# Patient Record
Sex: Female | Born: 1940 | Race: White | Hispanic: No | Smoking: Never smoker
Health system: Southern US, Community
[De-identification: ages and names within clinical notes are randomized; demographics above are authoritative.]

## PROBLEM LIST (undated history)

## (undated) DIAGNOSIS — E785 Hyperlipidemia, unspecified: Secondary | ICD-10-CM

## (undated) DIAGNOSIS — E039 Hypothyroidism, unspecified: Secondary | ICD-10-CM

## (undated) DIAGNOSIS — I10 Essential (primary) hypertension: Secondary | ICD-10-CM

## (undated) DIAGNOSIS — I509 Heart failure, unspecified: Secondary | ICD-10-CM

## (undated) DIAGNOSIS — I447 Left bundle-branch block, unspecified: Secondary | ICD-10-CM

## (undated) DIAGNOSIS — I429 Cardiomyopathy, unspecified: Secondary | ICD-10-CM

## (undated) DIAGNOSIS — M1990 Unspecified osteoarthritis, unspecified site: Secondary | ICD-10-CM

## (undated) DIAGNOSIS — I2510 Atherosclerotic heart disease of native coronary artery without angina pectoris: Secondary | ICD-10-CM

## (undated) DIAGNOSIS — K219 Gastro-esophageal reflux disease without esophagitis: Secondary | ICD-10-CM

## (undated) DIAGNOSIS — I340 Nonrheumatic mitral (valve) insufficiency: Secondary | ICD-10-CM

## (undated) DIAGNOSIS — N200 Calculus of kidney: Secondary | ICD-10-CM

## (undated) DIAGNOSIS — I4892 Unspecified atrial flutter: Secondary | ICD-10-CM

## (undated) DIAGNOSIS — E669 Obesity, unspecified: Secondary | ICD-10-CM

## (undated) DIAGNOSIS — F5104 Psychophysiologic insomnia: Secondary | ICD-10-CM

## (undated) DIAGNOSIS — D649 Anemia, unspecified: Secondary | ICD-10-CM

## (undated) HISTORY — DX: Hyperlipidemia, unspecified: E78.5

## (undated) HISTORY — DX: Unspecified atrial flutter: I48.92

## (undated) HISTORY — PX: CHOLECYSTECTOMY: SHX55

## (undated) HISTORY — PX: ABDOMINAL HYSTERECTOMY: SHX81

## (undated) HISTORY — DX: Anemia, unspecified: D64.9

## (undated) HISTORY — PX: HEMORROIDECTOMY: SUR656

## (undated) HISTORY — DX: Unspecified osteoarthritis, unspecified site: M19.90

## (undated) HISTORY — DX: Nonrheumatic mitral (valve) insufficiency: I34.0

## (undated) HISTORY — DX: Hypothyroidism, unspecified: E03.9

## (undated) HISTORY — DX: Obesity, unspecified: E66.9

## (undated) HISTORY — DX: Essential (primary) hypertension: I10

## (undated) HISTORY — DX: Calculus of kidney: N20.0

## (undated) HISTORY — PX: NOSE SURGERY: SHX723

## (undated) HISTORY — DX: Left bundle-branch block, unspecified: I44.7

## (undated) HISTORY — PX: JOINT REPLACEMENT: SHX530

## (undated) HISTORY — DX: Psychophysiologic insomnia: F51.04

## (undated) HISTORY — DX: Gastro-esophageal reflux disease without esophagitis: K21.9

## (undated) HISTORY — DX: Atherosclerotic heart disease of native coronary artery without angina pectoris: I25.10

## (undated) HISTORY — DX: Cardiomyopathy, unspecified: I42.9

## (undated) HISTORY — PX: APPENDECTOMY: SHX54

---

## 1997-09-09 ENCOUNTER — Ambulatory Visit: Admission: RE | Admit: 1997-09-09 | Discharge: 1997-09-09 | Payer: Self-pay | Admitting: Pulmonary Disease

## 1998-01-14 ENCOUNTER — Inpatient Hospital Stay (HOSPITAL_COMMUNITY): Admission: EM | Admit: 1998-01-14 | Discharge: 1998-01-18 | Payer: Self-pay | Admitting: Cardiology

## 1999-09-26 ENCOUNTER — Ambulatory Visit (HOSPITAL_COMMUNITY): Admission: RE | Admit: 1999-09-26 | Discharge: 1999-09-26 | Payer: Self-pay | Admitting: *Deleted

## 1999-10-13 ENCOUNTER — Inpatient Hospital Stay (HOSPITAL_COMMUNITY): Admission: EM | Admit: 1999-10-13 | Discharge: 1999-10-21 | Payer: Self-pay | Admitting: Emergency Medicine

## 1999-10-13 ENCOUNTER — Encounter: Payer: Self-pay | Admitting: *Deleted

## 1999-10-15 ENCOUNTER — Encounter: Payer: Self-pay | Admitting: *Deleted

## 1999-10-16 ENCOUNTER — Encounter: Payer: Self-pay | Admitting: *Deleted

## 2000-04-16 ENCOUNTER — Encounter: Payer: Self-pay | Admitting: Orthopedic Surgery

## 2000-04-16 ENCOUNTER — Ambulatory Visit (HOSPITAL_COMMUNITY): Admission: RE | Admit: 2000-04-16 | Discharge: 2000-04-16 | Payer: Self-pay | Admitting: Orthopedic Surgery

## 2000-05-21 ENCOUNTER — Ambulatory Visit (HOSPITAL_COMMUNITY): Admission: RE | Admit: 2000-05-21 | Discharge: 2000-05-21 | Payer: Self-pay | Admitting: Orthopedic Surgery

## 2000-05-21 ENCOUNTER — Encounter: Payer: Self-pay | Admitting: Orthopedic Surgery

## 2000-06-02 ENCOUNTER — Ambulatory Visit (HOSPITAL_COMMUNITY): Admission: RE | Admit: 2000-06-02 | Discharge: 2000-06-02 | Payer: Self-pay | Admitting: Orthopedic Surgery

## 2000-06-02 ENCOUNTER — Encounter: Payer: Self-pay | Admitting: Orthopedic Surgery

## 2000-08-23 ENCOUNTER — Encounter: Payer: Self-pay | Admitting: Internal Medicine

## 2000-08-23 ENCOUNTER — Ambulatory Visit (HOSPITAL_COMMUNITY): Admission: RE | Admit: 2000-08-23 | Discharge: 2000-08-23 | Payer: Self-pay | Admitting: Internal Medicine

## 2000-11-01 ENCOUNTER — Encounter: Payer: Self-pay | Admitting: Emergency Medicine

## 2000-11-01 ENCOUNTER — Inpatient Hospital Stay (HOSPITAL_COMMUNITY): Admission: EM | Admit: 2000-11-01 | Discharge: 2000-11-05 | Payer: Self-pay | Admitting: Cardiology

## 2000-11-02 ENCOUNTER — Encounter: Payer: Self-pay | Admitting: Cardiology

## 2001-03-16 ENCOUNTER — Encounter: Payer: Self-pay | Admitting: Emergency Medicine

## 2001-03-17 ENCOUNTER — Inpatient Hospital Stay (HOSPITAL_COMMUNITY): Admission: EM | Admit: 2001-03-17 | Discharge: 2001-03-18 | Payer: Self-pay | Admitting: Emergency Medicine

## 2001-03-18 ENCOUNTER — Encounter: Payer: Self-pay | Admitting: Cardiology

## 2001-12-01 ENCOUNTER — Inpatient Hospital Stay (HOSPITAL_COMMUNITY): Admission: AD | Admit: 2001-12-01 | Discharge: 2001-12-03 | Payer: Self-pay | Admitting: General Surgery

## 2001-12-01 ENCOUNTER — Encounter: Payer: Self-pay | Admitting: General Surgery

## 2002-07-05 ENCOUNTER — Encounter: Payer: Self-pay | Admitting: Internal Medicine

## 2002-07-05 ENCOUNTER — Ambulatory Visit (HOSPITAL_COMMUNITY): Admission: RE | Admit: 2002-07-05 | Discharge: 2002-07-05 | Payer: Self-pay | Admitting: Internal Medicine

## 2002-09-30 ENCOUNTER — Emergency Department (HOSPITAL_COMMUNITY): Admission: EM | Admit: 2002-09-30 | Discharge: 2002-10-01 | Payer: Self-pay | Admitting: Emergency Medicine

## 2002-09-30 ENCOUNTER — Encounter: Payer: Self-pay | Admitting: Emergency Medicine

## 2003-05-07 ENCOUNTER — Ambulatory Visit (HOSPITAL_COMMUNITY): Admission: RE | Admit: 2003-05-07 | Discharge: 2003-05-07 | Payer: Self-pay | Admitting: Family Medicine

## 2003-05-15 ENCOUNTER — Ambulatory Visit (HOSPITAL_COMMUNITY): Admission: RE | Admit: 2003-05-15 | Discharge: 2003-05-15 | Payer: Self-pay | Admitting: Family Medicine

## 2003-05-23 ENCOUNTER — Ambulatory Visit (HOSPITAL_COMMUNITY): Admission: RE | Admit: 2003-05-23 | Discharge: 2003-05-23 | Payer: Self-pay | Admitting: Urology

## 2003-07-16 ENCOUNTER — Inpatient Hospital Stay (HOSPITAL_COMMUNITY): Admission: AD | Admit: 2003-07-16 | Discharge: 2003-07-20 | Payer: Self-pay | Admitting: Family Medicine

## 2003-08-29 ENCOUNTER — Ambulatory Visit (HOSPITAL_COMMUNITY): Admission: RE | Admit: 2003-08-29 | Discharge: 2003-08-29 | Payer: Self-pay | Admitting: Family Medicine

## 2003-08-31 ENCOUNTER — Emergency Department (HOSPITAL_COMMUNITY): Admission: EM | Admit: 2003-08-31 | Discharge: 2003-08-31 | Payer: Self-pay | Admitting: Emergency Medicine

## 2003-09-03 ENCOUNTER — Ambulatory Visit (HOSPITAL_COMMUNITY): Admission: RE | Admit: 2003-09-03 | Discharge: 2003-09-03 | Payer: Self-pay | Admitting: Family Medicine

## 2003-10-25 ENCOUNTER — Ambulatory Visit (HOSPITAL_COMMUNITY): Admission: RE | Admit: 2003-10-25 | Discharge: 2003-10-25 | Payer: Self-pay | Admitting: Family Medicine

## 2004-05-23 ENCOUNTER — Ambulatory Visit (HOSPITAL_COMMUNITY): Admission: RE | Admit: 2004-05-23 | Discharge: 2004-05-23 | Payer: Self-pay | Admitting: Family Medicine

## 2004-05-26 ENCOUNTER — Ambulatory Visit: Payer: Self-pay | Admitting: Orthopedic Surgery

## 2004-07-07 ENCOUNTER — Ambulatory Visit: Payer: Self-pay | Admitting: Orthopedic Surgery

## 2004-07-14 ENCOUNTER — Ambulatory Visit (HOSPITAL_COMMUNITY): Admission: RE | Admit: 2004-07-14 | Discharge: 2004-07-14 | Payer: Self-pay | Admitting: Orthopedic Surgery

## 2004-07-17 ENCOUNTER — Ambulatory Visit: Payer: Self-pay | Admitting: Orthopedic Surgery

## 2004-09-15 ENCOUNTER — Ambulatory Visit (HOSPITAL_COMMUNITY): Admission: RE | Admit: 2004-09-15 | Discharge: 2004-09-15 | Payer: Self-pay | Admitting: Family Medicine

## 2004-12-18 ENCOUNTER — Encounter (HOSPITAL_COMMUNITY): Admission: RE | Admit: 2004-12-18 | Discharge: 2005-01-09 | Payer: Self-pay | Admitting: Family Medicine

## 2005-01-13 ENCOUNTER — Encounter (HOSPITAL_COMMUNITY): Admission: RE | Admit: 2005-01-13 | Discharge: 2005-02-12 | Payer: Self-pay | Admitting: Family Medicine

## 2005-03-23 ENCOUNTER — Ambulatory Visit: Payer: Self-pay | Admitting: Cardiology

## 2005-04-27 ENCOUNTER — Emergency Department (HOSPITAL_COMMUNITY): Admission: EM | Admit: 2005-04-27 | Discharge: 2005-04-27 | Payer: Self-pay | Admitting: Emergency Medicine

## 2005-05-06 ENCOUNTER — Ambulatory Visit: Payer: Self-pay | Admitting: Orthopedic Surgery

## 2005-06-03 ENCOUNTER — Ambulatory Visit: Payer: Self-pay | Admitting: Orthopedic Surgery

## 2005-06-29 ENCOUNTER — Encounter: Admission: RE | Admit: 2005-06-29 | Discharge: 2005-06-29 | Payer: Self-pay | Admitting: Orthopedic Surgery

## 2005-08-24 ENCOUNTER — Ambulatory Visit: Payer: Self-pay | Admitting: Orthopedic Surgery

## 2005-09-15 ENCOUNTER — Ambulatory Visit (HOSPITAL_COMMUNITY): Admission: RE | Admit: 2005-09-15 | Discharge: 2005-09-15 | Payer: Self-pay | Admitting: Family Medicine

## 2005-09-17 ENCOUNTER — Ambulatory Visit (HOSPITAL_COMMUNITY): Admission: RE | Admit: 2005-09-17 | Discharge: 2005-09-17 | Payer: Self-pay | Admitting: Family Medicine

## 2005-10-19 ENCOUNTER — Encounter: Admission: RE | Admit: 2005-10-19 | Discharge: 2005-10-19 | Payer: Self-pay | Admitting: Orthopedic Surgery

## 2005-11-29 ENCOUNTER — Emergency Department (HOSPITAL_COMMUNITY): Admission: EM | Admit: 2005-11-29 | Discharge: 2005-11-29 | Payer: Self-pay | Admitting: Emergency Medicine

## 2005-11-30 ENCOUNTER — Emergency Department (HOSPITAL_COMMUNITY): Admission: EM | Admit: 2005-11-30 | Discharge: 2005-11-30 | Payer: Self-pay | Admitting: Emergency Medicine

## 2006-06-18 ENCOUNTER — Ambulatory Visit (HOSPITAL_COMMUNITY): Admission: RE | Admit: 2006-06-18 | Discharge: 2006-06-18 | Payer: Self-pay | Admitting: Gastroenterology

## 2006-09-20 ENCOUNTER — Ambulatory Visit (HOSPITAL_COMMUNITY): Admission: RE | Admit: 2006-09-20 | Discharge: 2006-09-20 | Payer: Self-pay | Admitting: Family Medicine

## 2006-11-16 ENCOUNTER — Ambulatory Visit: Payer: Self-pay | Admitting: Cardiology

## 2006-11-25 ENCOUNTER — Ambulatory Visit: Payer: Self-pay | Admitting: Internal Medicine

## 2006-11-25 ENCOUNTER — Encounter (HOSPITAL_COMMUNITY): Admission: RE | Admit: 2006-11-25 | Discharge: 2006-12-25 | Payer: Self-pay | Admitting: Cardiology

## 2006-11-25 ENCOUNTER — Ambulatory Visit: Payer: Self-pay | Admitting: Cardiovascular Disease

## 2006-12-27 ENCOUNTER — Ambulatory Visit: Payer: Self-pay | Admitting: Cardiology

## 2007-03-22 ENCOUNTER — Ambulatory Visit: Payer: Self-pay | Admitting: Cardiology

## 2007-03-29 ENCOUNTER — Ambulatory Visit: Payer: Self-pay | Admitting: Cardiology

## 2007-04-14 HISTORY — PX: REVISION TOTAL HIP ARTHROPLASTY: SHX766

## 2007-04-22 ENCOUNTER — Ambulatory Visit: Payer: Self-pay | Admitting: Cardiology

## 2007-06-21 ENCOUNTER — Ambulatory Visit (HOSPITAL_COMMUNITY): Admission: RE | Admit: 2007-06-21 | Discharge: 2007-06-21 | Payer: Self-pay | Admitting: Family Medicine

## 2007-06-23 ENCOUNTER — Ambulatory Visit: Payer: Self-pay | Admitting: Cardiology

## 2007-06-27 ENCOUNTER — Encounter: Admission: RE | Admit: 2007-06-27 | Discharge: 2007-06-27 | Payer: Self-pay | Admitting: *Deleted

## 2007-06-30 ENCOUNTER — Emergency Department (HOSPITAL_COMMUNITY): Admission: EM | Admit: 2007-06-30 | Discharge: 2007-06-30 | Payer: Self-pay | Admitting: Emergency Medicine

## 2007-08-08 ENCOUNTER — Inpatient Hospital Stay (HOSPITAL_COMMUNITY): Admission: RE | Admit: 2007-08-08 | Discharge: 2007-08-11 | Payer: Self-pay | Admitting: Orthopedic Surgery

## 2007-10-03 ENCOUNTER — Ambulatory Visit (HOSPITAL_COMMUNITY): Admission: RE | Admit: 2007-10-03 | Discharge: 2007-10-03 | Payer: Self-pay | Admitting: Family Medicine

## 2008-01-26 ENCOUNTER — Ambulatory Visit: Payer: Self-pay | Admitting: Cardiology

## 2008-10-05 ENCOUNTER — Ambulatory Visit (HOSPITAL_COMMUNITY): Admission: RE | Admit: 2008-10-05 | Discharge: 2008-10-05 | Payer: Self-pay | Admitting: Family Medicine

## 2008-12-05 DIAGNOSIS — E039 Hypothyroidism, unspecified: Secondary | ICD-10-CM

## 2008-12-05 DIAGNOSIS — I10 Essential (primary) hypertension: Secondary | ICD-10-CM

## 2008-12-05 DIAGNOSIS — D649 Anemia, unspecified: Secondary | ICD-10-CM | POA: Insufficient documentation

## 2008-12-05 DIAGNOSIS — E785 Hyperlipidemia, unspecified: Secondary | ICD-10-CM

## 2008-12-05 DIAGNOSIS — G4700 Insomnia, unspecified: Secondary | ICD-10-CM

## 2008-12-05 DIAGNOSIS — N189 Chronic kidney disease, unspecified: Secondary | ICD-10-CM

## 2008-12-05 DIAGNOSIS — N200 Calculus of kidney: Secondary | ICD-10-CM

## 2009-01-21 ENCOUNTER — Encounter (HOSPITAL_COMMUNITY): Admission: RE | Admit: 2009-01-21 | Discharge: 2009-02-20 | Payer: Self-pay | Admitting: Orthopedic Surgery

## 2009-02-14 ENCOUNTER — Encounter: Admission: RE | Admit: 2009-02-14 | Discharge: 2009-02-14 | Payer: Self-pay | Admitting: Orthopedic Surgery

## 2009-02-19 ENCOUNTER — Encounter (INDEPENDENT_AMBULATORY_CARE_PROVIDER_SITE_OTHER): Payer: Self-pay | Admitting: *Deleted

## 2009-02-19 ENCOUNTER — Ambulatory Visit: Payer: Self-pay | Admitting: Cardiology

## 2009-02-19 LAB — CONVERTED CEMR LAB
BUN: 22 mg/dL
Bilirubin, Direct: 0.1 mg/dL
CO2: 25 meq/L
Calcium: 9.8 mg/dL
Chloride: 104 meq/L
Cholesterol: 202 mg/dL
Creatinine, Ser: 0.93 mg/dL

## 2009-04-04 ENCOUNTER — Encounter (INDEPENDENT_AMBULATORY_CARE_PROVIDER_SITE_OTHER): Payer: Self-pay | Admitting: *Deleted

## 2009-04-04 LAB — CONVERTED CEMR LAB
ALT: 14 units/L
AST: 19 units/L
Alkaline Phosphatase: 119 units/L
Bilirubin, Direct: 0.1 mg/dL
Cholesterol: 186 mg/dL
Creatinine, Ser: 0.84 mg/dL
HDL: 73 mg/dL
LDL Cholesterol: 93 mg/dL
Sodium: 143 meq/L
Triglycerides: 99 mg/dL

## 2009-04-19 ENCOUNTER — Encounter (INDEPENDENT_AMBULATORY_CARE_PROVIDER_SITE_OTHER): Payer: Self-pay | Admitting: *Deleted

## 2009-04-22 ENCOUNTER — Ambulatory Visit: Payer: Self-pay | Admitting: Adult Health

## 2009-04-22 DIAGNOSIS — R0789 Other chest pain: Secondary | ICD-10-CM

## 2009-05-01 ENCOUNTER — Emergency Department (HOSPITAL_COMMUNITY): Admission: EM | Admit: 2009-05-01 | Discharge: 2009-05-01 | Payer: Self-pay | Admitting: Emergency Medicine

## 2009-05-17 ENCOUNTER — Encounter (HOSPITAL_COMMUNITY): Admission: RE | Admit: 2009-05-17 | Discharge: 2009-06-16 | Payer: Self-pay | Admitting: Cardiology

## 2009-05-17 ENCOUNTER — Ambulatory Visit: Payer: Self-pay | Admitting: Cardiology

## 2009-05-17 ENCOUNTER — Encounter: Payer: Self-pay | Admitting: Cardiology

## 2009-05-23 ENCOUNTER — Encounter: Payer: Self-pay | Admitting: Cardiology

## 2009-06-17 ENCOUNTER — Encounter (INDEPENDENT_AMBULATORY_CARE_PROVIDER_SITE_OTHER): Payer: Self-pay | Admitting: *Deleted

## 2009-06-17 ENCOUNTER — Encounter: Payer: Self-pay | Admitting: Cardiology

## 2009-06-17 LAB — CONVERTED CEMR LAB
ALT: 12 units/L
BUN: 20 mg/dL
BUN: 20 mg/dL (ref 6–23)
CO2: 23 meq/L (ref 19–32)
Calcium: 9.4 mg/dL
Chloride: 112 meq/L
Cholesterol: 155 mg/dL (ref 0–200)
Creatinine, Ser: 0.87 mg/dL (ref 0.40–1.20)
Glucose, Bld: 102 mg/dL — ABNORMAL HIGH (ref 70–99)
HDL: 62 mg/dL
HDL: 62 mg/dL (ref >39)
LDL Cholesterol: 74 mg/dL
Potassium: 4.6 meq/L
Sodium: 144 meq/L (ref 135–145)
Total Bilirubin: 0.3 mg/dL (ref 0.3–1.2)
Total Protein: 6.2 g/dL
Total Protein: 6.2 g/dL (ref 6.0–8.3)
Triglycerides: 97 mg/dL (ref <150)
VLDL: 19 mg/dL (ref 0–40)

## 2009-06-18 ENCOUNTER — Encounter (INDEPENDENT_AMBULATORY_CARE_PROVIDER_SITE_OTHER): Payer: Self-pay | Admitting: *Deleted

## 2009-10-07 ENCOUNTER — Ambulatory Visit (HOSPITAL_COMMUNITY): Admission: RE | Admit: 2009-10-07 | Discharge: 2009-10-07 | Payer: Self-pay | Admitting: Family Medicine
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
## 2009-11-12 ENCOUNTER — Encounter (INDEPENDENT_AMBULATORY_CARE_PROVIDER_SITE_OTHER): Payer: Self-pay | Admitting: *Deleted

## 2009-11-15 ENCOUNTER — Ambulatory Visit: Payer: Self-pay | Admitting: Cardiology

## 2009-12-26 ENCOUNTER — Encounter: Payer: Self-pay | Admitting: Cardiology

## 2009-12-26 LAB — CONVERTED CEMR LAB
Calcium: 10.2 mg/dL
Creatinine, Ser: 0.79 mg/dL
TSH: 2.903 microintl units/mL

## 2010-05-04 ENCOUNTER — Encounter: Payer: Self-pay | Admitting: Orthopedic Surgery

## 2010-05-15 NOTE — Letter (Signed)
Summary: [place] Results Engineer, agricultural at [facility]  [address] [place] [address]   Phone: [phone]  Fax: [phone]      May 23, 2009 MRN: [id number]   [facility] [address] [place], [place]  [address]   Dear Ms. [facility],  Your test ordered by Selena Batten has been reviewed by your physician (or physician assistant) and was found to be normal or stable. Your physician (or physician assistant) felt no changes were needed at this time.  ____ Echocardiogram  __X__ Cardiac Stress Test  ____ Lab Work  ____ Peripheral vascular study of arms, legs or neck  ____ CT scan or X-ray  ____ Lung or Breathing test  ____ Other: Please continue on current medical treatment.   Thank you.   Valera Castle, MD, F.A.C.C

## 2010-05-15 NOTE — Miscellaneous (Signed)
Summary: echo 05/17/2009,myoview,05/17/2009  Clinical Lists Changes  Observations: Added new observation of NUCLEAR NOS:  nm myoview pharmacologic stress    Ordering Physician: Valera Castle    Reading Physician:  Bing    Clinical Data: 70 year old woman with known coronary artery disease   presenting with chest discomfort.    NUCLEAR MEDICINE ADENOSINE STRESS MYOVIEW STUDY WITH SPECT AND LEFT   VENTRIUCLAR EJECTION FRACTION    Radionuclide Data: One-day rest/stress protocol performed with   10/30 mCi of Tc-36m Myoview.    Stress Data: Following Regadenoson infusion, the patient reported   severe flushing and warmth.  There was a moderate increase in heart   rate but no significant change in systolic blood pressure .  No   arrhythmias noted.    EKG: Normal sinus rhythm; left atrial abnormality; left bundle   branch block.  No significant change following pharmacologic   stress.    Scintigraphic Data: Acquisition notable for mild to moderate breast   attenuation.  Left ventricular size was at the upper limit of   normal.  On tomographic images reconstructed in standard planes,   there was a small defect of mild to moderate intensity involving   the distal septum and apex.  By comparison to the resting portion   of the study, no reversibility was appreciated.  The gated   reconstruction demonstrated moderate to severe distal septal and   apical hypokinesis with normal overall LV systolic function.  There   was decreased systolic accentuation of activity anteroapically and   in the distal anteroseptal wall.    IMPRESSION:   Abnormal pharmacologic stress nuclear myocardial study revealing a   nondiagnostic EKG due to the presence of left bundle branch block,   borderline left ventricular enlargement with a segmental wall   motion abnormalities but without significant overall LV dysfunction   and scintigraphic evidence for infarction in the territory of the   left  anterior descending coronary artery.  Other findings as noted.  (05/17/2009 9:09) Added new observation of ECHOINTERP:  Study Conclusions    - Left ventricle: The cavity size was at the upper limits of normal.     Wall thickness was at the upper limits of normal. Systolic     function was mildly to moderately reduced. The estimated ejection     fraction was in the range of 40% to 45%.   - Ventricular septum: Paradoxical septal motionconsistent with and     IVCD. Abnormal anterior wall motion and severe hypokinesis of     portions of the apex also noted.   - Aortic valve: Mildly calcified annulus. Trileaflet; mildly     thickened leaflets.   - Mitral valve: Mildly calcified annulus.   - Left atrium: The atrium was mildly dilated.   - Pulmonary arteries: PA peak pressure: 31mm Hg (S).   Transthoracic echocardiography. M-mode, complete 2D, spectral   Doppler, and color Doppler. Height: Height: 160cm. Height: 63in.   Weight: Weight: 108kg. Weight: 237.5lb. Body mass index: BMI:   42.2kg/m^2. Body surface area: BSA: 2.79m^2. Patient status:   Outpatient. Location: Echo laboratory.    --------------------------------------------------------------------  (05/17/2009 9:09)      Echocardiogram  Procedure date:  05/17/2009  Findings:       Study Conclusions    - Left ventricle: The cavity size was at the upper limits of normal.     Wall thickness was at the upper limits of normal. Systolic     function was mildly to moderately  reduced. The estimated ejection     fraction was in the range of 40% to 45%.   - Ventricular septum: Paradoxical septal motionconsistent with and     IVCD. Abnormal anterior wall motion and severe hypokinesis of     portions of the apex also noted.   - Aortic valve: Mildly calcified annulus. Trileaflet; mildly     thickened leaflets.   - Mitral valve: Mildly calcified annulus.   - Left atrium: The atrium was mildly dilated.   - Pulmonary arteries: PA peak  pressure: 31mm Hg (S).   Transthoracic echocardiography. M-mode, complete 2D, spectral   Doppler, and color Doppler. Height: Height: 160cm. Height: 63in.   Weight: Weight: 108kg. Weight: 237.5lb. Body mass index: BMI:   42.2kg/m^2. Body surface area: BSA: 2.62m^2. Patient status:   Outpatient. Location: Echo laboratory.    --------------------------------------------------------------------   Nuclear Study  Procedure date:  05/17/2009  Findings:       nm myoview pharmacologic stress    Ordering Physician: Valera Castle    Reading Physician: [place] Bing    Clinical Data: 70 year old woman with known coronary artery disease   presenting with chest discomfort.    NUCLEAR MEDICINE ADENOSINE STRESS MYOVIEW STUDY WITH SPECT AND LEFT   VENTRIUCLAR EJECTION FRACTION    Radionuclide Data: One-day rest/stress protocol performed with   10/30 mCi of Tc-31m Myoview.    Stress Data: Following Regadenoson infusion, the patient reported   severe flushing and warmth.  There was a moderate increase in heart   rate but no significant change in systolic blood pressure .  No   arrhythmias noted.    EKG: Normal sinus rhythm; left atrial abnormality; left bundle   branch block.  No significant change following pharmacologic   stress.    Scintigraphic Data: Acquisition notable for mild to moderate breast   attenuation.  Left ventricular size was at the upper limit of   normal.  On tomographic images reconstructed in standard planes,   there was a small defect of mild to moderate intensity involving   the distal septum and apex.  By comparison to the resting portion   of the study, no reversibility was appreciated.  The gated   reconstruction demonstrated moderate to severe distal septal and   apical hypokinesis with normal overall LV systolic function.  There   was decreased systolic accentuation of activity anteroapically and   in the distal anteroseptal wall.    IMPRESSION:   Abnormal  pharmacologic stress nuclear myocardial study revealing a   nondiagnostic EKG due to the presence of left bundle branch block,   borderline left ventricular enlargement with a segmental wall   motion abnormalities but without significant overall LV dysfunction   and scintigraphic evidence for infarction in the territory of the   left anterior descending coronary artery.  Other findings as noted.

## 2010-05-15 NOTE — Letter (Signed)
Summary: [place] Treadmill (Nuc Med Stress)  [facility]  [address], [place] [address]   Phone: [phone]  Fax: [phone]    Nuclear Medicine 1-Day Stress Test Information Sheet  Re:     [name]   DOB:     [birth year] MRN:     [id number] Weight:  Appointment Date: Register at: Appointment Time: Referring MD:  ___Exercise Stress  __Adenosine   __Dobutamine  _X_Lexiscan  __Persantine   __Thallium  Urgency: ____1 (next day)   ____2 (one week)    ____3 (PRN)  Patient will receive Follow Up call with results: Patient needs follow-up appointment:  Instructions regarding medication:  How to prepare for your stress test: 1. DO NOT eat or dring 6 hours prior to your arrival time. This includes no caffeine (coffee, tea, sodas, chocolate) if you were instructed to take your medications, drink water with it. 2. DO NOT use any tobacco products for at leaset 8 hours prior to arrival. 3. DO NOT wear dresses or any clothing that may have metal clasps or buttons. 4. Wear short sleeve shirts, loose clothing, and comfortalbe walking shoes. 5. DO NOT use lotions, oils or powder on your chest before the test. 6. The test will take approximately 3-4 hours from the time you arrive until completion. 7. To register the day of the test, go to the Short Stay entrance at [facility]. 8. If you must cancel your test, call [phone] as soon as you are aware.  After you arrive for test:   When you arrive at [facility], you will go to Short Stay to be registered. They will then send you to Radiology to check in. The Nuclear Medicine Tech will get you and start an IV in your arm or hand. A small amount of a radioactive tracer will then be injected into your IV. This tracer will then have to circulate for 30-45 minutes. During this time you will wait in the waiting room and you will be able to drink something without caffeine. A series of pictures will be  taken of your heart follwoing this waiting period. After the 1st set of pictures you will go to the stress lab to get ready for your stress test. During the stress test, another small amount of a radioactive tracer will be injected through your IV. When the stress test is complete, there is a short rest period while your heart rate and blood pressure will be monitored. When this monitoring period is complete you will have another set of pictrues taken. (The same as the 1st set of pictures). These pictures are taken between 15 minutes and 1 hour after the stress test. The time depends on the type of stress test you had. Your doctor will inform you of your test results within 7 days after test.    The possibilities of certain changes are possible during the test. They include abnormal blood pressure and disorders of the heart. Side effects of persantine or adenosine can include flushing, chest pain, shortness of breath, stomach tightness, headache and light-headedness. These side effects usually do not last long and are self-resolving. Every effort will be made to keep you comfortable and to minimize complications by obtaining a medical history and by close observation during the test. Emergency equipment, medications, and trained personnel are available to deal with any unusual situation which may arise.  Please notify office at least 48 hours in advance if you are unable to keep  this appt.

## 2010-05-15 NOTE — Assessment & Plan Note (Signed)
Summary: PALPITATION      Allergies Added:   Visit Type:  Follow-up Primary Provider:  Dr. [name]   History of Present Illness: [name] is a 61 obese CF with known history of CAD, with 60% LAD lesion found per cath in 2002, CKD, hyperlipidemia, LBBB, hyperlipidemia.  We are seeing her on follow-up at the request of her PCP, Dr. [name] secondary to her complains of increased fatigue, palpatations, and some exertional chest pain.  She is under a lot of stress caring for her husband who has Parkinsons. He requires a lot of help causing her not to sleep well at night because of his needs to assist with toileting and bathing, along with other ADL's.  She is finding that she is more exhausted than usual and attributes this to her stress level with her husbands care.  She has not had a stress test or an echo in >5 years.  She states that sometimes the pain in her chest comes when she is frustrated with her husband.  She denies dizziness, diaphoresis, NVD.  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 Take 1 Tab Daily 2)  Pravastatin Sodium 80 Mg Tabs (Pravastatin Sodium) .... Take One Tablet By Mouth Daily At Bedtime 3)  Prinivil 20 Mg Tabs (Lisinopril) .... Take 2 Tabs Daily 4)  Furosemide 40 Mg Tabs (Furosemide) .... Take 1 Tab Daily 5)  Loratadine 10 Mg Tabs (Loratadine) .... Take 1 Tab Daily 6)  Omeprazole 20 Mg Cpdr (Omeprazole) .... Take 1 Tab  Daily 7)  Amlodipine Besylate 10 Mg Tabs (Amlodipine Besylate) .... Take 1 Tab Daily  Allergies (verified): 1)  ! Codeine  Past History:  Past medical, surgical, family and social histories (including risk factors) reviewed, and no changes noted (except as noted below).  Past Medical History: Reviewed history from 02/19/2009 and no changes required. 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 Left THA 2009 appendectomy hysterectomy (partial) nose surgery hemorroid suirgery colon polyps removed  Family History: Reviewed history from 12/05/2008 and no changes required. Father:deceased due to heart issues Mother:deceased age 58 heart issues Siblings:2 brothers 1 deceased 1 sister with cad  Social History: Reviewed history from 12/05/2008 and no changes required. Retired  Married  Tobacco Use - No.  Alcohol Use - no Regular Exercise - no Drug Use - no Alcohol drinks/day:  0  Review of Systems       Fatigue, situational stress. All other systems have been reviewed and are negative unless stated above.   Vital Signs:  Patient profile:   70 year old  female Weight:      242 pounds Pulse rate:   63 / minute BP sitting:   138 / 68  (right arm)  Vitals Entered By: Dreama Saa, CNA (April 22, 2009 1:34 PM)  Physical Exam  General:  Well developed, well nourished, in no acute distress. Head:  normocephalic and  atraumatic Eyes:  PERRLA/EOM intact; conjunctiva and lids normal. Ears:  TM's intact and clear with normal canals and hearing Nose:  no deformity, discharge, inflammation, or lesions Mouth:  Teeth, gums and palate normal. Oral mucosa normal. Lungs:  Mild crackles, scattered., Heart:  Distant, RRR 1/6 systolic murmur Abdomen:  Obese, mild tenderness, recovering from UTI Msk:  Back normal, normal gait. Muscle strength and tone normal. Extremities:  No clubbing or cyanosis. Neurologic:  Alert and oriented x 3. Skin:  Dry Psych:  depressed affect.     EKG  Procedure date:  04/22/2009  Findings:      Normal sinus rhythm with rate of: 60bpm  Left axis deviation.  Left bundle branch block.    Impression & Recommendations:  Problem # 1:  CHEST PAIN-UNSPECIFIED (ICD-786.50) Assessment New She has not had cardiac work-up in sometime.  Will plan stress myoview, and echocardiogram.  She is unable to walk on treadmill secondary to arthritis.  Would like to avoid cardiac cath unless necessary, as she has CKD.  Would not want to insult kidney's unless we have to plan cath with abnormal stress test.  This may be related to stress as she is under considerable amount with no help at home with her husband. Her updated medication list for this problem includes:    Aspir-low 81 Mg Tbec (Aspirin) .Marland Kitchen... Take 1 tab daily    Prinivil 20 Mg Tabs (Lisinopril) .Marland Kitchen... Take 2 tabs daily    Amlodipine Besylate 10 Mg Tabs (Amlodipine besylate) .Marland Kitchen... Take 1 tab daily  Problem # 2:  CARDIAC CONDUCTION DISTURBANCE (ICD-426.9) Assessment: Unchanged  Her updated medication list for this problem includes:    Aspir-low 81 Mg Tbec (Aspirin) .Marland Kitchen... Take 1 tab daily    Prinivil 20 Mg Tabs (Lisinopril) .Marland Kitchen... Take 2 tabs daily    Amlodipine Besylate 10 Mg Tabs (Amlodipine besylate) .Marland Kitchen... Take 1 tab daily  Problem # 3:  HYPERTENSION (ICD-401.9) Assessment: Comment Only Currently controlled on current med regimine No  changes unless diagnostic testing is abnormal. Her updated medication list for this problem includes:    Aspir-low 81 Mg Tbec (Aspirin) .Marland Kitchen... Take 1 tab daily    Prinivil 20 Mg Tabs (Lisinopril) .Marland Kitchen... Take 2 tabs daily    Furosemide 40 Mg Tabs (Furosemide) .Marland Kitchen... Take 1 tab daily    Amlodipine Besylate 10 Mg Tabs (Amlodipine besylate) .Marland Kitchen... Take 1 tab daily  Other Orders: Nuclear Stress Test (Nuc Stress Test) 2-D Echocardiogram (2D Echo)  Patient Instructions: 1)  Your physician recommends that you schedule a follow-up appointment in: 6 months 2)  Your physician recommends that you continue on your current medications as directed. Please refer to the Current Medication list given to you today. 3)  Your physician has requested that you have an echocardiogram.  Echocardiography is a painless test that uses sound waves to create images of your heart. It provides your doctor with information about the size and shape of your heart and how well your heart's chambers and valves are working.  This procedure takes approximately one hour. There are no restrictions for this procedure. 4)  Your physician has requested that you have an [employer]  For further information please visit [email or web address]  Please follow instruction sheet, as given.

## 2010-05-15 NOTE — Miscellaneous (Signed)
Summary: LABS BMP,LIPID,LIVER 04/04/2009  Clinical Lists Changes  Observations: Added new observation of CALCIUM: 9.4 mg/dL (60/45/4098 11:91) Added new observation of ALBUMIN: 4.7 g/dL (47/82/9562 13:08) Added new observation of PROTEIN, TOT: 7.5 g/dL (65/78/4696 29:52) Added new observation of SGPT (ALT): 14 units/L (04/04/2009 12:19) Added new observation of SGOT (AST): 19 units/L (04/04/2009 12:19) Added new observation of ALK PHOS: 119 units/L (04/04/2009 12:19) Added new observation of BILI DIRECT: 0.1 mg/dL (84/13/2440 10:27) Added new observation of CREATININE: 0.84 mg/dL (25/36/6440 34:74) Added new observation of BUN: 10 mg/dL (25/95/6387 56:43) Added new observation of BG RANDOM: 103 mg/dL (32/95/1884 16:60) Added new observation of CO2 PLSM/SER: 21 meq/L (04/04/2009 12:19) Added new observation of CL SERUM: 107 meq/L (04/04/2009 12:19) Added new observation of K SERUM: 4.1 meq/L (04/04/2009 12:19) Added new observation of NA: 143 meq/L (04/04/2009 12:19) Added new observation of LDL: 93 mg/dL (63/04/6008 93:23) Added new observation of HDL: 73 mg/dL (55/73/2202 54:27) Added new observation of TRIGLYC TOT: 99 mg/dL (10/04/7626 31:51) Added new observation of CHOLESTEROL: 186 mg/dL (76/16/0737 10:62)

## 2010-05-15 NOTE — Letter (Signed)
Summary: [place] Results Engineer, agricultural at [facility]  [address] [place] [address]   Phone: [phone]  Fax: [phone]      June 18, 2009 MRN: [id number]   [facility] [address] [place], [place]  [address]   Dear Ms. [facility],  Your test ordered by Selena Batten has been reviewed by your physician (or physician assistant) and was found to be normal or stable. Your physician (or physician assistant) felt no changes were needed at this time.  ____ Echocardiogram  ____ Cardiac Stress Test  __x__ Lab Work  ____ Peripheral vascular study of arms, legs or neck  ____ CT scan or X-ray  ____ Lung or Breathing test  ____ Other:  No change in medical treatment at this time, per Dr. Dietrich Pates.  Enclosed is a copy of your labwork for your records.  Thank you, Lauraann Missey Allyne Gee RN    Jesup Bing, MD, Lenise Arena.C.Gaylord Shih, MD, F.A.C.C Lewayne Bunting, MD, F.A.C.C [name], MD, F.A.C.C Charlton Haws, MD, Lenise Arena.C.C   Appended Document:  Cardiology     Echocardiogram  Procedure date:  05/17/2009  Findings:        Study Conclusions    - Left ventricle: The cavity size was at the upper limits of normal.     Wall thickness was at the upper limits of normal. Systolic     function was mildly to moderately reduced. The estimated ejection     fraction was in the range of 40% to 45%.   - Ventricular septum: Paradoxical septal motionconsistent with and     IVCD. Abnormal anterior wall motion and severe hypokinesis of     portions of the apex also noted.   - Aortic valve: Mildly calcified annulus. Trileaflet; mildly     thickened leaflets.   - Mitral valve: Mildly calcified annulus.   - Left atrium: The atrium was mildly dilated.   - Pulmonary arteries: PA peak pressure: 31mm Hg (S).   Transthoracic echocardiography. M-mode, complete 2D, spectral   Doppler, and color Doppler. Height: Height: 160cm. Height: 63in.  Weight: Weight: 108kg. Weight: 237.5lb. Body mass index: BMI:   42.2kg/m^2. Body surface area: BSA: 2.42m^2. Patient status:   Outpatient. Location: Echo laboratory.    --------------------------------------------------------------------

## 2010-05-15 NOTE — Miscellaneous (Signed)
Summary: bmp,tsh per Dr. [name]  Clinical Lists Changes  Observations: Added new observation of CALCIUM: 10.2 mg/dL (16/01/9603 54:09) Added new observation of CREATININE: 0.79 mg/dL (81/19/1478 29:56) Added new observation of BUN: 15 mg/dL (21/30/8657 84:69) Added new observation of BG RANDOM: 93 mg/dL (62/95/2841 32:44) Added new observation of CO2 PLSM/SER: 26 meq/L (12/26/2009 14:17) Added new observation of CL SERUM: 105 meq/L (12/26/2009 14:17) Added new observation of K SERUM: 4.5 meq/L (12/26/2009 14:17) Added new observation of NA: 140 meq/L (12/26/2009 14:17) Added new observation of TSH: 2.903 microintl units/mL (12/26/2009 14:17)

## 2010-05-15 NOTE — Assessment & Plan Note (Signed)
Summary: 6 mth f/u per checkout on 04/22/09/tg  Medications Added PRAVASTATIN SODIUM 40 MG TABS (PRAVASTATIN SODIUM) take 1 tab daily      Allergies Added:   Visit Type:  Follow-up Primary Provider:  Dr. [name]   History of Present Illness: Ms. [name] returns to the office for continued assessment and treatment of single vessel coronary disease as a catheterization in 2002, left bundle branch block and multiple cardiovascular risk factors.  She continues to report class 2-3 dyspnea on exertion and ankle edema, that has been stable. She has no orthopnea nor PND.  She has not had significant chest discomfort despite performing all of her housework.  A nuclear stress test and echocardiogram were performed 6 months ago.  These demonstrated septal wall motion abnormalities consistent with the patient's known left bundle branch block, mildly impaired left ventricular systolic function and no evidence for ischemia or infarction.   Current Medications (verified): 1)  Aspir-Low 81 Mg Tbec (Aspirin) .... Take 1 Tab Daily 2)  Pravastatin Sodium 40 Mg Tabs (Pravastatin Sodium) .... Take 1 Tab Daily 3)  Prinivil 20 Mg Tabs (Lisinopril) .... Take 2 Tabs Daily 4)  Furosemide 40 Mg Tabs (Furosemide) .... Take 1 Tab Daily 5)  Omeprazole 20 Mg Cpdr (Omeprazole) .... Take 1 Tab Daily 6)  Amlodipine Besylate 10 Mg Tabs (Amlodipine Besylate) .... Take 1 Tab Daily  Allergies (verified): 1)  ! Codeine  Past History:  PMH, FH, and Social History reviewed and updated.  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  Vital Signs:  Patient profile:   70 year old female Weight:      245 pounds Pulse rate:   64 / minute BP sitting:   141 / 68  (right arm)  Vitals Entered By: Dreama Saa, CNA (November 15, 2009 1:03 PM)  Physical Exam  General:  Overweight; well developed; no acute distress:   Neck-No JVD; no carotid bruits: Lungs-No tachypnea, no rales; no rhonchi; no wheezes: Cardiovascular-normal PMI; normal S1 and S2; S4 present Abdomen-BS normal; soft and non-tender without masses or organomegaly:  Musculoskeletal-No deformities, no cyanosis or clubbing: Neurologic-Normal cranial nerves; symmetric strength and tone:  Skin-Warm, no significant lesions: Extremities-Nl distal pulses; prominent soft tissue and ankles; modest edema:     Impression & Recommendations:  Problem # 1:  CHEST PAIN-UNSPECIFIED (ICD-786.50) With no evidence for myocardial ischemia on a stress nuclear study and resolution of symptoms, no further testing will be performed.  Problem # 2:  CHRONIC KIDNEY DISEASE STAGE I (ICD-585.1) Recent BUN and creatinine were normal; there appears to be no significant renal impairment at present.  Problem # 3:  HYPERLIPIDEMIA, CONTROLLED (ICD-272.4) Lipid profile in March showed total cholesterol 155, triglycerides 97, HDL 62 and LDL of 74.  Current therapy is optimal.    I will plan to reassess  this nice woman in 9 months.  Other Orders: Future Orders: T-Basic Metabolic Panel [phone]) ... 03/17/2010  Patient Instructions: 1)  Your physician recommends that you schedule a  follow-up appointment in: 9 months 2)  Your physician recommends that you return for lab work in:4 months

## 2010-05-15 NOTE — Miscellaneous (Signed)
Summary: 2D echo  Clinical Lists Changes  Observations: Added new observation of ECHOINTERP:  Study Conclusions    - Left ventricle: The cavity size was at the upper limits of normal.     Wall thickness was at the upper limits of normal. Systolic     function was mildly to moderately reduced. The estimated ejection     fraction was in the range of 40% to 45%.   - Ventricular septum: Paradoxical septal motionconsistent with and     IVCD. Abnormal anterior wall motion and severe hypokinesis of     portions of the apex also noted.   - Aortic valve: Mildly calcified annulus. Trileaflet; mildly     thickened leaflets.   - Mitral valve: Mildly calcified annulus.   - Left atrium: The atrium was mildly dilated.   - Pulmonary arteries: PA peak pressure: 31mm Hg (S).   Transthoracic echocardiography. M-mode, complete 2D, spectral   Doppler, and color Doppler. Height: Height: 160cm. Height: 63in.   Weight: Weight: 108kg. Weight: 237.5lb. Body mass index: BMI:   42.2kg/m^2. Body surface area: BSA: 2.53m^2. Patient status:   Outpatient. Location: Echo laboratory.    -------------------------------------------------------------------- (05/17/2009 17:49)      Echocardiogram  Procedure date:  05/17/2009  Findings:       Study Conclusions    - Left ventricle: The cavity size was at the upper limits of normal.     Wall thickness was at the upper limits of normal. Systolic     function was mildly to moderately reduced. The estimated ejection     fraction was in the range of 40% to 45%.   - Ventricular septum: Paradoxical septal motionconsistent with and     IVCD. Abnormal anterior wall motion and severe hypokinesis of     portions of the apex also noted.   - Aortic valve: Mildly calcified annulus. Trileaflet; mildly     thickened leaflets.   - Mitral valve: Mildly calcified annulus.   - Left atrium: The atrium was mildly dilated.   - Pulmonary arteries: PA peak pressure: 31mm Hg (S).  Transthoracic echocardiography. M-mode, complete 2D, spectral   Doppler, and color Doppler. Height: Height: 160cm. Height: 63in.   Weight: Weight: 108kg. Weight: 237.5lb. Body mass index: BMI:   42.2kg/m^2. Body surface area: BSA: 2.48m^2. Patient status:   Outpatient. Location: Echo laboratory.    --------------------------------------------------------------------

## 2010-05-15 NOTE — Miscellaneous (Signed)
Summary: labs cmp,lipids,06/17/2009  Clinical Lists Changes  Observations: Added new observation of CALCIUM: 9.4 mg/dL (16/01/9603 5:40) Added new observation of ALBUMIN: 3.9 g/dL (98/02/9146 8:29) Added new observation of PROTEIN, TOT: 6.2 g/dL (56/21/3086 5:78) Added new observation of SGPT (ALT): 12 units/L (06/17/2009 9:10) Added new observation of SGOT (AST): 18 units/L (06/17/2009 9:10) Added new observation of ALK PHOS: 104 units/L (06/17/2009 9:10) Added new observation of CREATININE: 0.87 mg/dL (46/96/2952 8:41) Added new observation of BUN: 20 mg/dL (32/44/0102 7:25) Added new observation of BG RANDOM: 102 mg/dL (36/64/4034 7:42) Added new observation of CO2 PLSM/SER: 23 meq/L (06/17/2009 9:10) Added new observation of CL SERUM: 112 meq/L (06/17/2009 9:10) Added new observation of K SERUM: 4.6 meq/L (06/17/2009 9:10) Added new observation of NA: 144 meq/L (06/17/2009 9:10) Added new observation of LDL: 74 mg/dL (59/56/3875 6:43) Added new observation of HDL: 62 mg/dL (32/95/1884 1:66) Added new observation of TRIGLYC TOT: 97 mg/dL (10/11/1599 0:93) Added new observation of CHOLESTEROL: 155 mg/dL (23/55/7322 0:25)

## 2010-08-26 NOTE — Discharge Summary (Signed)
[name], [name]             ACCOUNT NO.:  [id number]   MEDICAL RECORD NO.:  [id number]          PATIENT TYPE:  INP   LOCATION:  5010                         FACILITY:  [facility]   PHYSICIAN:  Feliberto Gottron. Turner Daniels, M.D.   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   HISTORY OF PRESENT ILLNESS:  The patient is a 70 year old woman who we  have followed in the office for avascular necrosis of the left femoral  head.  She has failed conservative treatment with anti-inflammatory  medicine, attempts at weight loss, and physical therapy and still has  severe unremitting pain that prevents her activities of daily living,  wakes her up at night, and overall she is not able function in a normal  manner.  She now desires elective left total hip arthroplasty to  decrease pain and increase function.  The risks and benefits of surgery  were discussed with her at length and she is prepared for surgical  intervention.   PAST MEDICAL HISTORY:  Significant for hypertension, coronary artery  disease, high cholesterol.  In the past, she has had an appendectomy,  cholecystectomy, and hysterectomy.  She is a nonsmoker.  She does not  use any alcohol.  She is married.   FAMILY HISTORY:  Noncontributory.   She does have some problems with sleep apnea and uses oxygen in the  night time.   ALLERGIES:  She is allergic to CODEINE.   MEDICATIONS:  Aspirin, Prevacid, simvastatin, isosorbide, promethazine,  levothyroxine, lisinopril, amlodipine, and she uses sublingual  nitroglycerin, although rarely.   PHYSICAL EXAMINATION:  Pertinent for a severe left-sided limp, any  attempts at internal rotation of left hip cause severe pain.  She is  neurovascularly intact at the left lower extremity.  She is morbidly  obese.   Plain  radiographs showed bone-on-bone arthritic changes with collapse of  the femoral head.  Preoperative laboratory data revealed a hemoglobin of  12.3, hematocrit of 36, white count of 7.5.  Her coags were normal with  a Protime of 12.8 and INR of 0.9.  Her CMET was normal.  Her  preoperative UA showed some nitrate and a few white cells.  We did give  her some ciprofloxacin preoperatively and by the time of her admission  she was asymptomatic.   HOSPITAL COURSE:  After preoperative clearance on the date of admission,  she was taken to the operating room and underwent a left total hip  arthroplasty using a DePuy 52 mm ASR cup, a 20 x 15 x 42 x 160 stem, MK  -3 Ultimate ball of 46 mm.  She tolerated the procedure well.  On the  postoperative film, there was noted to be a small crack at the tip of  the greater trochanter, but that did not seem to impair her ability to  get out of the bed and mobilize.  On August 10, 2007, her hemoglobin was  noted to  be down around 8.3.  She was a little bit dizzy when if she got  out of bed.  She was given 1 pint of packed red blood cells which  brought her hemoglobin up to 9.6 by the afternoon on the 29th and she  was mobilizing quite well, going back and forth the nurse's station and  to past physical therapy.  Her wound remained benign with little of any  drainage and just prior to discharge we went ahead and changed her  dressing.  She was ambulating independently at the time of discharge,  eating well, and her gait was working in a fairly normal fashion.   DISPOSITION:  The patient will be discharged home on August 11, 2007.  [facility] will be taken care of her Coumadin, which will  keep her on for 2 weeks.  They will be monitoring her wound and helping  over the physical therapy which is mainly just mobilizing, getting in  and out of bed, and walking.  She will come back to see [place] in the office  in 1 week.   DISCHARGE MEDICATIONS:   Include all those on her HMR with the addition  of Coumadin 5 mg take as directed and Percocet 5 mg 1-2 p.o. q.4-6 h.  p.r.n., dispensed 60, no refills.   FINAL DIAGNOSIS:  Avascular necrosis of the left hip.   SECONDARY DIAGNOSIS:  Postoperative anemia requiring transfusion.   She is weightbearing as tolerated on a walker and I look forward to  seeing her back in a week.      Feliberto Gottron. Turner Daniels, M.D.  Electronically Signed     FJR/MEDQ  D:  08/11/2007  T:  08/11/2007  Job:  [id number]

## 2010-08-26 NOTE — Op Note (Signed)
[name], [name]             ACCOUNT NO.:  [id number]   MEDICAL RECORD NO.:  [id number]          PATIENT TYPE:  INP   LOCATION:  5010                         FACILITY:  [facility]   PHYSICIAN:  Feliberto Gottron. Turner Daniels, M.D.   DATE OF BIRTH:  1940/07/06   DATE OF PROCEDURE:  08/08/2007  DATE OF DISCHARGE:                               OPERATIVE REPORT   PREOPERATIVE DIAGNOSES:  Osteoarthritis and avascular necrosis, left  hip.   POSTOPERATIVE DIAGNOSES:  Osteoarthritis and avascular necrosis, left  hip.   PROCEDURES:  Left total hip arthroplasty using a DePuy 52-mm ASR cup, NK  -346 mm Ultimate ball, 20 x 15 x 42 x 160 S-ROM stem, 20D small cone.   SURGEON:  Feliberto Gottron. Turner Daniels, MD   FIRST ASSISTANT:  Elberta Leatherwood, PA-C   ANESTHETIC:  General endotracheal.   ESTIMATED BLOOD LOSS:  400 mL.   FLUID REPLACEMENT:  1500 mL of crystalloid.   DRAINS PLACED:  Foley catheter.   URINE OUTPUT:  300 mL.   INDICATIONS FOR PROCEDURE:  A 70 year old woman with end-stage arthritis  of the left hip secondary to avascular necrosis documented by  radiographic examination.  She has severe unremitting pain that prevents  activities of daily living and requires increasing use of narcotic  analgesics.  She has trouble ambulating with a crutch or walker, and at  this point, desires elective left total hip arthroplasty to decrease  pain and increase function.  Risks and benefits of surgery discussed,  questions answered.   DESCRIPTION OF PROCEDURE:  The patient identified by armband and was  taken to the operating room at [facility], where the appropriate  anesthetic monitors were attached and general endotracheal anesthesia  induced with the patient in supine position.  Foley catheter was  inserted, and about 300 mL of urine was obtained.  She was then rolled  into the right lateral decubitus position and fixed there with a Hulbert  Mark II pelvic clamp keeping the pelvis vertical.  She did  receive  antibiotics IV preoperatively.  The left lower extremity was then  prepped and draped in usual sterile fashion from the ankle to the  hemipelvis.  The skin along the lateral hip and thigh was infiltrated  with 10 mL of 0.5% Marcaine and epinephrine solution.  We began the  procedure by making a 22-cm incision centered over the greater  trochanter through the skin and subcutaneous tissue down to the level of  the IT band, which was cut in line with the skin incision exposing the  greater trochanter.  Using cobra retractors, we then isolated the short  external rotators and piriformis by placing the one between the gluteus  minimus and the superior hip joint capsule and the second cobra between  the quadratus femoris and the inferior hip joint capsule.  This exposed  the short external rotators and the capsule.  The piriformis was then  tagged with a #2 Ethibond suture and cut off its insertion on the  intertrochanteric crest exposing the capsule, which was then developed  into an acetabular based flap going from posterior-superior along  the  acetabular rim over the femoral neck, and then inferior and posterior  back to the acetabulum.  This flap was tagged with two #2 Ethibond  sutures exposing the femoral neck and the arthritic femoral head.  The  hip was then flexed and internally rotated dislocating the femoral head,  and a standard neck cut was performed one fingerbreadth above the lesser  trochanter.  The femoral head was examined and found to have AVN with  collapse.  The proximal femur was then translated anteriorly on the  anterior column using a Hohmann retractor.  A cobra retractor was placed  into the cotyloid notch and a posteroinferior wing retractor was  hammered into place exposing the acetabulum.  The labrum was then  excised.  We then reamed up to a 51-mm basket reamer obtaining good cut  in all quadrants and lightly touched the rim with a 52-mm basket reamer.  A  52 ASR cup was then selected and hammered into place in 45 degrees of  abduction and about 20 degrees of anteversion leaving about one-eighth  of an inch of these exposed posteriorly and superiorly.  The hip was  then flexed and internally rotated allowing [place] to enter the proximal  femur with the box cutting chisel followed by the initiating reamer,  followed by axial reaming up to a 14-mm reamer.  We just started to get  a little chatter.  We reamed to 15, had good chatter, and then reamed to  15.5 because of the good quality of her bone.  The proximal portion only  was reamed to 16 mm, and we then conically reamed up to a 20D cone to  the appropriate depth for 42 base neck, and then calcar milled for a 20D  small calcar.  A 20D small cone was then hammered into place followed by  a trial stem with a 42 base neck and a -346 trial ball in the same  version as the calcar about 20 degrees of anteversion.  The hip was then  reduced, which was quite difficult due to her size.  The limb came to  full extension with external rotation, could not be dislocated  anteriorly.  In flexion, we could flex her to 90 and internally rotate  to about 70 before there was any instability.  At this point, the trial  components were removed.  A 20D small ZTT1 cone was hammered into the  proximal femur.  We then reamed to 15.5 with the axial reamer, followed  by a 20 x 15 x 42 x 160 S-ROM stem in the same version as the calcar.  Once this was seated, an NK -346 mm Ultimate ball was hammered onto the  stem.  The hip again reduced and stability checked, and noted to be  excellent.  At this point, the piriformis and short external rotators  plus the capsular flap were repaired back to the intertrochanteric crest  through drill holes with a #2 Ethibond suture.  The wound was thoroughly  irrigated out with normal saline solution.  IT band was closed with  running #1 Vicryl sutures.  The subcutaneous tissue in two  layers of 0-  Vicryl and one layer of 2-0 Vicryl followed by skin staples.  Between  each layer, we did irrigate.  A Mepilex dressing was then applied.  The  patient was rolled supine, awakened, and taken to the recovery room  without difficulty.      Feliberto Gottron. Turner Daniels, M.D.  Electronically Signed  FJR/MEDQ  D:  08/08/2007  T:  08/09/2007  Job:  [id number]

## 2010-08-26 NOTE — Letter (Signed)
March 22, 2007    [name]. Jillian Chance, MD  [address]  [place], [place]  [address]   RE:  Jillian, Evans  MRN:  [id number]  /  DOB:  16-Oct-1940   Dear [name]:   It was my pleasure to evaluate [name] in the office today at your  request.  As you know, she has felt fatigued lately and had some  dizziness.  You suggested that she might need an event recorder or  Holter monitor.  There have been no falls nor loss of consciousness.  Her verapamil and atenolol dosages were decreased at her last visit due  to bradycardia.  She has had no chest pain and no dyspnea.  She  attributes some of her fatigue to the stress of caring for her husband  with dementia.   PHYSICAL EXAMINATION:  GENERAL:  [name] in no acute  distress.  VITAL SIGNS:  The weight is 245, 9 pounds less than 3 months ago.  Blood  pressure 110/70, heart rate 50 and somewhat irregular, respirations 18.  NECK:  No jugular venous distention; normal carotid upstrokes without  bruits.  LUNGS:  Clear.  CARDIAC:  Normal 1st and 2nd heart sounds; minimal early systolic  ejection murmur.  ABDOMEN:  Soft and nontender; no organomegaly.  EXTREMITIES:  Considerable soft tissue around the ankles; no edema.   Rhythm strip:  Sinus rhythm with frequent sinus pauses, borderline first  degree AV block, IVCD.   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   We will stop verapamil and atenolol.  Norvasc will be started for  adequate control of hypertension.  We will check a chemistry profile and  magnesium level.  This nice woman will return in 1 week to see the  cardiology nurses for repeat rhythm strip and in 1 month to see me.    Sincerely,      Gerrit Friends. Dietrich Pates, MD, [facility]  Electronically Signed    RMR/MedQ  DD: 03/22/2007  DT: 03/22/2007  Job #: [id number]

## 2010-08-26 NOTE — Letter (Signed)
December 27, 2006    [name]. [name], MD  [address]  [place], [place] [address]   RE:  AARIONNA, GERMER  MRN:  [id number]  /  DOB:  July 27, 1940   Dear [name],   Ms. Keng returns to the office for continued assessment and  treatment of chronic chest discomfort and coronary disease. She  continues to have fairly mild left chest pain that is unpredictable in  it's occurrence. It may be more prevalent when she is fatigued, which  happens frequently since her husband has dementia and frequently keeps  her up at night. Otherwise, there have been no significant health issues  since her last visit.   Her stress test was technically difficult due to breast attentuation.  There was no convincing evidence for ischemia or infarction. Left  ventricular systolic function remains normal.   On exam, pleasant overweight woman in no acute distress. The weight is  254, unchanged. Blood pressure 105/85, heart rate 65 and regular,  respirations 18.  NECK: No jugular venous distension; no carotid bruits.  LUNGS: Clear.  CARDIAC: Fourth heart sound and modest systolic murmur.  ABDOMEN: Soft and nontender; no organomegaly.  EXTREMITIES: Tenderness over the lower anterior leg without significant  pitting edema.   IMPRESSION:  Ms. Palmer blood pressure is doing better with a decrease  in the dose of her medications. Her chest discomfort appears to be at a  tolerable level at present. I will not change any of her medical therapy  and will plan to see this nice woman again in 1 year.    Sincerely,      Gerrit Friends. Dietrich Pates, MD, [facility]  Electronically Signed    RMR/MedQ  DD: 12/27/2006  DT: 12/27/2006  Job #: [id number]

## 2010-08-26 NOTE — Letter (Signed)
January 26, 2008    [name]. Jillian Chance, MD  [address]  [place], [place]  [address]   RE:  [name], [name]  MRN:  [id number]  /  DOB:  1941/01/24   Dear [name]:   Ms. [name] returns to the office for continued assessment and  treatment of mild coronary artery disease, at least in 2002, when last  assessed in cardiovascular risk factors.  Since her last visit, nearly 9  months ago, she has done quite well.  She reports feeling as well she  has for some time, in fact.  Blood pressure control has been good.  She  is able to care for her husband, who is chronically ill, and for her  house without difficulty.   CURRENT MEDICATIONS INCLUDE:  1. Amlodipine 5 mg daily.  2. Aspirin 81 mg daily.  3. Furosemide 40 mg p.r.n.  4. Isosorbide mononitrate 60 mg daily.  5. Zyrtec p.r.n.  6. Advair p.r.n.  7. Atrovent p.r.n.  8. NTG p.r.n.  9. Simvastatin 40 mg daily.  10.Lisinopril 40 mg daily.  11.Prilosec 20 mg daily.   PHYSICAL EXAMINATION:  GENERAL:  Overweight pleasant woman in no acute  distress.  VITAL SIGNS:  The weight is 240, 11 pounds less than last year.  Blood  pressure 100/70, heart rate 66 and regular, respirations 14.  NECK:  No jugular venous distention; normal carotid upstrokes without  bruits.  LUNGS:  Decreased breath sounds at the bases; otherwise clear.  CARDIAC:  Distant first and second heart sounds.  ABDOMEN:  Soft and nontender; normal bowel sounds; no organomegaly.  EXTREMITIES:  Trace edema; distal pulses intact.  SKIN:  No significant lesions.   Most recent laboratory is from April, when she was anemic with a  hemoglobin of 10.3 and a normal MCV.  This was apparently due to left  total hip arthroplasty, which occurred shortly before that test.  Chemistry profile at that time was normal.   IMPRESSION:  [name] is doing very well.  We will obtain laboratory  studies recently drawn in your office to verify that no further testing  or change  in her therapy is warranted.  Control of hypertension is  excellent.  She was told that simvastatin is working well for her  hyperlipidemia.  Her borderline renal function has been normal when most  recently assessed.  I assume that her postoperative anemia resolved.  I  will see this nice woman again in 1 year.    Sincerely,      Gerrit Friends. Dietrich Pates, MD, [facility]  Electronically Signed    RMR/MedQ  DD: 01/26/2008  DT: 01/27/2008  Job #: [id number]

## 2010-08-26 NOTE — Letter (Signed)
November 16, 2006    [name]. Jillian Chance, MD  [address]  [place], [place] [address]   RE:  [name], [name]  MRN:  [id number]  /  DOB:  05/06/1940   Dear [name]:   [name] was seen in the office today at her request.  Unfortunately, she has been lost to followup for the past 2 years.  Fortunately, she has done well.  She has had no significant cardiac  problems.  She does have intermittent chest discomfort that is promptly  relieved with nitroglycerin.  She has been diagnosed as having  nephrolithiasis.  She has not been hospitalized nor required urgent  medical care.   CURRENT MEDICATIONS:  1. Atenolol 50 mg daily.  2. Levothyroxine 0.05 mg daily.  3. Aspirin 81 mg daily.  4. Simvastatin 40 mg daily.  5. Prevacid 30 mg daily.  6. Furosemide 40 mg daily.  7. Advair 100/50 mcg b.i.d.  8. Verapamil 250 mg daily.  9. Atrovent inhaler two puffs q.i.d.  10.Nitroglycerin transdermal.  11.Imdur 30 mg daily.   [name] notes some mild dizziness.  This is not clearly  orthostatic.   On exam, a pleasant overweight woman.  The weight is 254, three pounds  less than in December 2006.  Blood pressure 100/60, heart rate 64 and  regular, respirations 16.  No orthostatic change in blood pressure.  HEENT:  Anicteric sclerae.  Neck:  No jugulovenous distention; no  carotid bruits.  Lungs:  Clear.  Cardiac:  Normal first and second heart  sounds; moderate systolic ejection murmur.  Abdomen:  Soft and  nontender; no organomegaly.  Extremities:  Trace edema; normal distal  pulses.   Laboratory tests from April are available.  Chemistry profile and LFTs  were good, as was her lipid profile.   IMPRESSION:  [name] is doing generally well. She has a stable  chest pain syndrome that is not clearly related to her mild coronary  disease seen at catheterization in 2002.  She has not had a stress test  since then.  We will proceed with a pharmacologic Myoview  study.  I will  reassess this nice women based upon the results of that examination.    Sincerely,      Gerrit Friends. Dietrich Pates, MD, [facility]  Electronically Signed    RMR/MedQ  DD: 11/16/2006  DT: 11/17/2006  Job #: [id number]

## 2010-08-26 NOTE — Letter (Signed)
April 22, 2007    [name]. Jillian Chance, MD  [address]  [place], [place] [address]   RE:  [name], IBACH  MRN:  [id number]  /  DOB:  Aug 19, 1940   Dear [name]:   [name] returns to the office for continued assessment and  treatment of hypertension and conduction system disease.  Since her last  visit, she has done much better.  She has minimal, if any dizziness.  She has some mild edema for which she uses furosemide approximately once  per week.  She has followed blood pressures at home, but did not keep a  list for me.   CURRENT MEDICATIONS:  1. Amlodipine 5 mg daily.  2. Prevacid 30 mg daily.  3. Aspirin 81 mg daily,  4. Levothyroxine 0.05 mg daily.  5. Hydrochlorothiazide 12.5 mg daily.  6. Avapro 150 mg daily.  7. Imdur 120 mg daily.  8. Zyrtec 10 mg daily.  9. Advair by MDI.  10.Atrovent by MDI.  11.NTG transdermally 3 times per week.  12.Pravastatin 40 mg nightly.   EXAM:  Overweight pleasant woman in no acute distress.  The weight is 251, 6 pounds more than last visit.  Blood pressure  155/80, heart rate 64 and regular, respirations 18.  NECK:  No jugular venous distention.  LUNGS:  Clear.  CARDIAC:  Normal first and second heart sounds; fourth heart sound  present.  ABDOMEN:  Soft and nontender; no organomegaly.  EXTREMITIES:  Considerable soft tissue at the ankle; no significant  edema.   RHYTHM STRIP:  Normal sinus rhythm at a rate of 74 with normal  intervals.   IMPRESSION:  [name] is doing generally well, but control of blood  pressure is somewhat suboptimal.  We will increase her dose of HCTZ to  25 mg daily and of Avapro to 300 mg daily.  She will return in 2 months  for a chemistry profile and assessment by the cardiology nurses.  She  will bring a list of blood pressures at that time.  I will see her again  in September as previously planned.    Sincerely,      Gerrit Friends. Dietrich Pates, MD, [facility]  Electronically Signed    RMR/MedQ  DD:  04/22/2007  DT: 04/22/2007  Job #: [id number]

## 2010-08-29 NOTE — H&P (Signed)
[name], [name]                          ACCOUNT NO.:  [id number]   MEDICAL RECORD NO.:  [id number]                   PATIENT TYPE:  INP   LOCATION:  A302                                 FACILITY:  [facility]   PHYSICIAN:  Jerolyn Shin C. Katrinka Blazing, M.D.                DATE OF BIRTH:  1940/05/05   DATE OF ADMISSION:  12/01/2001  DATE OF DISCHARGE:                                HISTORY & PHYSICAL   HISTORY OF PRESENT ILLNESS:  The patient is a 70 year old female referred by  Dr. Sherrie Mustache for evaluation of rectal bleeding. The patient states that she  has had rectal bleeding for many months. She has had periods of constipation  followed by periods of diarrhea. She states that she has had increased  pressure and pain with bowel movements. She has had bleeding with each bowel  movement. She states that she usually has severe pain before she has her  bowel movement. It does not matter whether or not her bowel movements are  hard or loose. She still sees blood. She has seen blood in the commode, on  the stool, as well as with wiping. She was seen in the office where she was  noted to have a mildly tender abdomen with guaiac positive stools and a  questionable rectal mass. She has active bleeding and is admitted to undergo  bowel prep and will do urgent colonoscopy in the morning.   PAST MEDICAL HISTORY:  1. Known coronary artery disease and has been followed since 2000. She had     cardiac catheterization in 2001 and 2002 with cardiac catheterization in     July of 2002 showing a 60-70% mid LAD stenosis with preserved ejection     fraction of at least 55%. She is being followed by the [facility] from     a cardiology standpoint.  2. Hypertension.  3. Chronic asthmatic bronchitis.  4. Peptic ulcer disease.  5. Gastroesophageal reflux disease.  6. Hypothyroidism.  7. Degenerative joint disease.  8. Obesity.   REVIEW OF SYSTEMS:  Positive for chronic left breast pain, bilateral foot  pain  which is presently being treated by podiatry, and she has restless leg  syndrome.   ALLERGIES:  CODEINE, HYDROCODONE, LODINE.   MEDICATIONS:  1. Furosemide 40 mg QD.  2. K-Dur 20 mEq QD.  3. Atenolol 50 mg QD.  4. Verapamil SR 240 mg QD.  5. Zocor 40 mg QHS.  6. Avapro 300 mg QD.  7. Zyrtec 10 mg QD.  8. Clonopin 1 mg QHS.  9. Darvocet N-100 q6h as needed.  10.      Nitrolingual spray as needed.  11.      Nitroglycerin patch 0.4 mg QD with the patch being off for 12     hours.  12.      Enteric coated aspirin 325 mg QD.  13.      Synthroid  50 mcg QD.  14.      Prevacid 30 mg QD.  15.      Atrovent MDI two puffs bid.  16.      Advair 100/50 every 12 hours.   FAMILY HISTORY:  Positive for hypertension, atherosclerotic cardiovascular  disease, diabetes mellitus, oral pharyngeal cancer, stroke.   SOCIAL HISTORY:  The patient is married. 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 She does not drink, smoke,  or use drugs.   PHYSICAL EXAMINATION:  GENERAL: She is a mildly obese female in no acute  distress. She appears to be quite anxious.  VITAL SIGNS: Blood pressure is 114/80. Pulse 64, respiratory rate 18, and  weight 265 pounds.  HEENT: Unremarkable.  NECK: Supple without jugular venous distention. No bruit.  CHEST: Clear to auscultation and percussion. No rales, rubs, rhonchi, or  wheezes.  HEART: Regular rate and rhythm without murmur, rub, or gallop.  ABDOMEN: Mild tenderness right lower abdomen and left upper quadrant with no  palpable organomegaly. Good active bowel sounds.  RECTAL: Tender mass high in the rectum with bright red blood. She has normal  sphincter tone and positive hemorrhoids.  EXTREMITIES: There is 1+ edema of the feet and legs, especially at the  ankles. No clubbing, cyanosis. No major joint deformity. No significant  crepitus.  NEURO: No motor, sensory, or cerebellar deficit. Cranial nerves intact. Deep   tendon reflexes are intact.   IMPRESSION:  1. Recurrent rectal bleeding.  2. Changing bowel habits.  3. Coronary artery disease single vessel left anterior descending with 60-     70% mid LAD lesion with preserved EF of 55%.  4. Hypertension.  5. Asthma with chronic bronchitis.  6. Peptic ulcer disease.  7. Gastroesophageal reflux disease.  8. Hypothyroidism.  9. Osteoarthritis.   PLAN:  The patient will be admitted. She will undergo rapid bowel prep and  we will do urgent colonoscopy. Will recheck EKG and will pursue other  potential operative therapy as indicated.                                               Dirk Dress. Katrinka Blazing, M.D.    LCS/MEDQ  D:  12/01/2001  T:  12/02/2001  Job:  [id number]

## 2010-08-29 NOTE — Consult Note (Signed)
[name], [name]                          ACCOUNT NO.:  [id number]   MEDICAL RECORD NO.:  [id number]                   PATIENT TYPE:  INP   LOCATION:  A302                                 FACILITY:  [facility]   PHYSICIAN:  Elliot Cousin, M.D.                 DATE OF BIRTH:  19-May-1940   DATE OF CONSULTATION:  12/01/2001  DATE OF DISCHARGE:                          INTERNAL MEDICINE CONSULTATION   REASON FOR CONSULTATION:  Medical management of chronic medical conditions.   HISTORY OF PRESENT ILLNESS:  The patient is a 70 year old white lady who has  a past medical history significant for coronary artery disease,  hypertension, asthma, and hypothyroidism who presented to the office today  for recurrent rectal bleeding.  The patient was actually examined by me on  November 22, 2001 for a four-week history of intermittent rectal bleeding  which the patient felt was secondary to hemorrhoids.  The patient does have  a history of hemorrhoids with hemorrhoidectomy several years ago.  On my  exam - November 22, 2001 - the patient had a small external hemorrhoid that  was nonbleeding.  The stool was light brown and guaiac negative.  The  patient was therefore referred to Dr. Katrinka Blazing for further evaluation of rectal  bleeding to rule out cancer.  The patient was seen by Dr. Katrinka Blazing today in the  office.  Per his exam, the patient had positive blood in the rectal vault  with questionable palpation of a mass high up in the rectum with moderate  tenderness.  The patient will therefore be admitted for further evaluation  and management of rectal bleeding with a questionable rectal mass.   The patient describes her stools as intermittently red and maroon in color.  She denied dripping red blood from her rectum.  She states that she has had  red to maroon colored stools with every other bowel movement.  She states  that her stools have been intermittently hard and soft.   REVIEW OF SYSTEMS:  Positive for  occasional heartburn; joint pain and  stiffness, especially in her knees and her feet; restless legs at night; and  a chronic dry cough which has gotten better recently.  The patient also has  intermittent chest pain but none over the past week.   Her review of systems is negative for weight loss, fever, chills, headache,  blurred vision, double vision, difficulty swallowing, dyspnea, orthopnea,  pleurisy, dysuria, weakness, anxiety, or depression.   PAST MEDICAL HISTORY:  1. Coronary artery disease.     a. Admission to [facility] July 2002 secondary to chest pain.     b. Cardiac catheterization in July 2002 revealed a normal left main        artery, a 30% proximal LAD stenosis, and a 60-70% mid LAD stenosis,        second diagonal branch with a 40% ostial stenosis, and no coronary  artery disease of the right coronary artery.  Ejection fraction 55%.     c. Cardiolite perfusion study, December 2002, revealed an ejection        fraction of 69%, reversible distal anterior apical and distal anterior        lateral perfusion defect which is suspicious for ischemia.  2. Hyperlipidemia:  Fasting lipid panel July 2003 revealed total cholesterol     180, total triglyceride 116, HDL 68, and LDL 89.  3. Hypertension.  4. Hypothyroidism:  TSH 2.0 and free T4 1.05 - within normal limits, July     2003.  5. Chronic asthmatic bronchitis.  6. History of peptic ulcer disease/gastroesophageal reflux disease with     antral gastritis and hiatal hernia per EGD in October 1999 (by Dr.     Juanda Chance).  7. History of chronic lower extremity edema.  8. Restless legs syndrome.  9. Status post total abdominal hysterectomy secondary to menorrhagia in     1979.  10.      Status post appendectomy in 1971.  11.      Status post cholecystectomy.  12.      Status post hemorrhoidectomy.  13.      Obesity.  14.      Stress fracture of both feet in 2001.  15.      Degenerative joint disease.  16.       History of shingles x2.   MEDICATIONS:  1. Furosemide 40 mg q.d.  2. Potassium chloride 20 mEq q.d.  3. Atenolol 50 mg q.d.  4. Verapamil SR 240 mg q.d.  5. Zocor 40 mg q.h.s.  6. Avapro 300 mg q.d.  7. Zyrtec 10 mg q.d.  8. Clonopin 1 mg one q.h.s. p.r.n.  9. Darvocet-N 100 q.6h. p.r.n. pain.  10.      Nitroglycerin patch 0.4 mg q.d. - off at bedtime.  11.      Enteric-coated aspirin 325 mg q.d.  12.      Synthroid 50 mcg q.d.  13.      Prevacid 30 mg q.d.  14.      Atrovent MDI two puffs b.i.d. p.r.n.  15.      Advair Diskus 100/50 b.i.d.   ALLERGIES:  1. CODEINE.  2. HYDROCODONE.  3. LODINE.  4. BUFFERIN.   FAMILY HISTORY:  The patient's family history is strongly positive for  coronary artery disease and hypertension.  The patient's mother died at 55  years of age secondary to myocardial infarction.  Her mother also had a  history of cancer of the throat and hypertension.  Her father died of a  myocardial infarction as a young man, age unknown.  She has one brother who  has coronary artery disease.  She had one sister who has coronary artery  disease.  She has several more siblings - all of which have hypertension.  She has three children - one daughter age 48 years of age, a son 52 years of  age, and another daughter who is 31 years of age.  The 43 year old daughter  has fibromyalgia.  Otherwise, no known health problems in her children.   SOCIAL HISTORY:  The patient is married and lives in [place], [place].  She is disabled.  She obtained her G.E.D.  She can read and  write.  She denies alcohol, drug, and tobacco use.   PHYSICAL EXAMINATION:  VITAL SIGNS:  Temperature 97.0, pulse 88, respiratory  rate 20, blood pressure 120/50.  GENERAL:  The patient is a pleasant, alert, obese, Caucasian woman who is  currently sitting in a chair in her hospital room in no acute distress. HEENT:  Head is normocephalic, nontraumatic.  Pupils are equal, round, and   reactive to light.  Extraocular movements are intact.  Conjunctivae are  clear, sclerae are white.  Oral mucosa is moist and no exudates or erythema.  Nasal mucosa is moist and no drainage.  NECK:  Obese and supple.  No adenopathy, no thyromegaly, no JVD.  LUNGS:  Clear to auscultation bilaterally.  Nonlabored breathing.  HEART:  S1, S2, with a 2/6 systolic murmur.  ABDOMEN:  Obese, positive bowel sounds, soft, mildly tender over the right  lateral abdomen and the left upper quadrant.  No masses palpated.  RECTAL:  Per Dr. Michaelle Copas exam.  GENITOURINARY:  Deferred.  EXTREMITIES:  The patient has several varicose veins of the lower  extremities bilaterally.  She has a trace of nonpitting edema.  Her pedal  pulses are 2+ bilaterally.  She does have mild tenderness of the plantar  surfaces, particularly at the plantar fascia bilaterally.  NEUROLOGIC:  The patient is alert and oriented x3.  Cranial nerves II-XII  are intact.  Strength is 5/5 throughout.  Sensation is intact.   ADMISSION LABORATORY DATA:  EKG:  Normal sinus rhythm with a rate of 67.   ABG on room air:  pH 7.398, PCO2 37.6, PO2 88.5.  WBC 8.7, hemoglobin 12.7,  hematocrit 37.3, platelets 333.  Sodium 136, potassium 3.8, chloride 105,  CO2 29, glucose 133, BUN 13, creatinine 1.0, total bilirubin 0.6, alkaline  phosphatase 153, SGOT 25, SGPT 23, total protein 7.1, albumin 3.7, calcium  9.7.   ASSESSMENT:  1. Rectal bleeding with a questionable rectal mass.  Rule out colon cancer.  2. Coronary artery disease with a positive Cardiolite perfusion scan for     ischemia.  The patient is currently asymptomatic.  Her EKG is within     normal limits.  3. Hypertension.  4. Hypothyroidism.  5. Chronic asthmatic bronchitis.  6. Gastroesophageal reflux disease with history of gastritis.  7. Chronic lower extremity edema.   PLAN:  1. Will follow the patient medically along with Dr. Katrinka Blazing.  2. Agree with obtaining a 2-D echocardiogram  to reevaluate systolic function     if the patient needs surgery.  3. Consider cardiology consult, given that the patient had a negative     Cardiolite perfusion study in December 2002.  4. Continue all chronic medications for the patient's chronic medical     conditions.                                                   Elliot Cousin, M.D.    DF/MEDQ  D:  12/01/2001  T:  12/02/2001  Job:  [id number]

## 2010-08-29 NOTE — Cardiovascular Report (Signed)
[place]  Patient:    [name], [name]                       MRN: [id number] Proc. Date: 11/04/00 Adm. Date:  [id number] Attending:  Learta Codding                        Cardiac Catheterization  DATE OF BIRTH:  01-26-1941.  REFERRING PHYSICIAN:  Elliot Cousin, M.D.  CARDIOLOGIST:  Gerrit Friends. Dietrich Pates, M.D. [facility]  INDICATIONS:  The patient is a 70 year old woman with hypertension, dyslipidemia, and a history of atypical chest pain who has undergone cardiac catheterization in the past revealing a mid LAD lesion that was felt to be not hemodynamically significant based on intravascular ultrasound revealing the stenosis to be approximately 30%.  The patient has undergone a more recent myocardial perfusion study which revealed apical and lateral ischemia.  She presents with recurrent chest pain and is referred for cardiac catheterization to define her coronary anatomy.  PROCEDURES PERFORMED:  Left heart catheter, selective coronary angiography, left ventriculography.  DESCRIPTION OF PROCEDURE:  After informed consent was obtained, the patient was taken to the cardiac catheterization lab.  She was prepped and draped in the usual sterile fashion and the area about the right femoral artery was anesthetized with 1% lidocaine.  A 6-French sheath was placed via the modified Seldinger technique and selective coronary angiography was performed using JL4 and JR4 catheters.  A left ventriculogram was performed using an angled pigtail catheter.  The patient tolerated the procedure well and there were no obvious complications.  HEMODYNAMICS:  Left ventricle 168/22, aorta 168/80.  ANGIOGRAPHIC FINDINGS: 1. The left main is short and is free of flow-limiting coronary artery    disease. 2. The left anterior descending artery is a large caliber vessel that gives    off two diagonal branches, the second a bifurcating branch.  There is a    30% proximal LAD  stenosis and a 60-70% mid LAD stenosis at the takeoff of    the second diagonal branch.  The second diagonal branch also has a 40%    ostial stenosis. 3. The circumflex is a large caliber vessel that gives off three obtuse    marginal branches.  There is no flow-limiting coronary artery disease    present. 4. The right coronary artery is small and nondominant.  There is no evidence    of flow-limiting coronary artery disease.  The ventriculogram reveals no focal wall motion abnormalities and an estimated ejection fraction of 55%.  There is no evidence of mitral regurgitation.  CONCLUSIONS AND RECOMMENDATIONS: 1. Single-vessel coronary artery disease involving the mid left anterior    descending artery.  Reviewed these films with Dr. Gerri Spore who performed    the patients last cardiac catheterization with intravascular ultrasound    study in 2001.  It was felt that this lesion has not changed significantly    and her clinical presentation is very similar at this time. 2. Will recommend continued medical therapy at this point.  DD:  11/04/00 TD:  11/04/00 Job: [id number]

## 2010-08-29 NOTE — Discharge Summary (Signed)
[facility]  Patient:    CHUDNEY, SCHEFFLER                       MRN: [id number] Adm. Date:  [id number] Disc. Date: [id number] Attending:  Learta Codding Dictator:   Chinita Pester, N.P. CC:         Dr. Adin Hector, N.CGerrit Friends. Dietrich Pates, M.D. [facility]   Discharge Summary  HISTORY OF PRESENT ILLNESS:  This is a 70 year old female who was transferred to the [facility] from [facility] for evaluation of chest pain. She has a history of heart disease and multiple cardiac catheterizations. Per patient, last was June of 2001. No interventions, negative diabetes, positive hypertension, hypercholesterolemia. The patient states she has had intermittent chest pain since the day prior to admission. She felt better the following day. She was running doing errands and then sitting in the living room. She developed chest pain and took two nitroglycerin with some relief but got worse and went to [facility]. It was 6/10 initially and now it was 4/10, positive shortness of breath, negative diaphoresis, positive nausea, worse then previous chest pain that she has had, positive dyspnea on exertion. Pain was described as burning and crackling.  HOSPITAL COURSE:  The patient was admitted. She felt weak and washed out with no recurrent chest pain. The patient underwent a cardiac catheterization which showed nonobstructive coronary artery disease with an ejection fraction of 55%. LAD was 30% proximal, 60-70% mid at V2 takeoff, 40% at ostial V2. Circ was negative, RCA nondominant and was negative for CAD. The patient was to be treated with medical therapy. She requested and overnight stay and was discharged to home the following day in stable condition. She was discharged on the following medications:  DISCHARGE MEDICATIONS:  1. Atenolol daily as before.  2. Synthroid as before.  3. Premarin as before.  4. Lasix 40 daily.  5. K-Dur as before.  6. Verapamil was  increased to 240 mg daily which was to be filled with     her next prescription.  7. Zocor 40 daily.  8. Aspirin daily.  9. Prevacid 15 daily. 10. Nitroglycerin sublingual as needed. 11. Imdur 60 daily.  The patient was to have no strenuous activity or driving for two days and not to lift more than 10 pounds for one week. Low fat, low sodium diet. She was to call the office if she had any increased pain, swelling, or bleeding from her groin. She was to follow-up with Dr. Langston Masker physician assistant on August 8 at 11 a.m. and Dr. Sherrie Mustache as needed. DD:  11/05/00 TD:  11/06/00 Job: [id number]

## 2010-08-29 NOTE — Discharge Summary (Signed)
[place]  Patient:    [name], [name]                       MRN: [id number] Adm. Date:  [id number] Disc. Date: 10/21/99 Attending:  Nelta Numbers Dictator:   Joellyn Rued, P.A.C. CC:         Gerrit Friends. Dietrich Pates, M.D. [facility]             Alinda Deem, M.D.                  Referring Physician Discharge [facility]  DATE OF BIRTH:  [birth year]  SUMMARY OF HISTORY:  [name] is a 70 year old white female who is usually followed by Dr. Dietrich Pates and ______.  In October 1999, she underwent cardiac catheterization which revealed an 80% LAD lesion and underwent IVUS, and the plan was for medical therapy and she was enrolled in the REVERSAL study. She has done well from a cardiac standpoint on medical therapy.  She was readmitted on June 15 to undergo cardiac catheterization as part of the follow-up of the REVERSAL study.  This was performed without difficulty.  It showed a 20% proximal LAD, 50% mid LAD just after the origin of the second diagonal.  This was felt to be stable compared to prior catheterization.  The second diagonal had a 30% stenosis in the ostial portion.  The RCA was nondominant and free of disease.  The circumflex was dominant and also free of disease.  LV function was normal.  Catheterization was closed utilizing Perclose.  Since her discharge, she has developed sharp, shooting pain extending down into her thigh and her right foot.  She was seen in the office by Corine Shelter for this discomfort several days prior to admission and was given samples of Vioxx.  However, this has not improved.  Thus she presents with continuing discomfort.  In fact, on the Sunday preceding admission, she woke up and she states she could not put any weight on her right foot and was unable to walk without severe pain.  Thus she was admitted for further evaluation.  Her history is also notable for hyperlipidemia, hypothyroidism, hypertension, and  obesity, and nonobstructive coronary artery disease.  LABORATORY DATA:  TSH was 2.651 on admission.  Sodium 138, potassium 4.4, BUN 15, creatinine 0.7, glucose 92.  PT 13.5, PTT 27.  D-dimer 0.24.  H&H 12.8 and 36.8, normal indices, platelets 372, wbcs 7.8.  ESR 22.  Blood cultures do not show any growth.  Bone three-phase scan showed increased activity in the fifth metatarsal head and mid tarsus, question suspicion for osteomyelitis versus occult stress fracture.  Two-view of the foot revealed soft tissue swelling without findings suggestive of osteomyelitis.  CT of the abdomen and pelvis did reveal bilateral nonobstructive renal calculi, prior cholecystectomy, no signs of retroperitoneal hematoma, and evidence of sigmoid diverticulosis.  Chest revealed mild cardiomegaly, calcified granuloma in the left upper hilar region.  No evidence of active disease.  Dopplers did not reveal any evidence of pseudoaneurysm or AV fistula.  There was no DVT in her lower extremities.  HOSPITAL COURSE:  [name] was admitted to 3700 and multiple tests ordered to evaluate her continuing discomfort.  Please see above results.  Neurology became involved on July 5.  They also felt that it was not related to her catheterization and bone scan was ordered as previously described.  ABIs were also performed and were felt to  be within normal limits.  Mobility team assisted with ambulation.  Activity gradually increased.  Dr. Turner Daniels, orthopedics, also became involved.  MRI was performed on July 8 and reviewed with orthopedics.  They felt that she could be discharged home.  Prior to her discharge, she received orders for rolling walker, obtained a boot from orthopedics, and was ambulating with physical therapy.  She was discharged home.  DISCHARGE DIAGNOSES: 1. Right lower extremity discomfort, unknown etiology. 2. History as previously described.  DISPOSITION:  She is discharged home.  She is asked to  resume her home medications.  MEDICATIONS: 1. Atenolol 50 mg q.d. 2. Synthroid 0.05 q.d. 3. Aspirin 325 q.d. 4. Premarin 0.625 q.d. 5. Lasix 40 q.d. 6. Adalat 60 q.d. 7. Pravachol 20 q.h.s. 8. She was also given a prescription for Desipramine 25 mg at bedtime for the    next three days, and then increase it to 50 mg at bedtime.  She was also instructed she could take Motrin as directed.  ACTIVITY:  As tolerated.  DIET:  She was advised low salt/fat/cholesterol diet.  FOLLOW-UP:  She was asked to arrange a follow-up appointment with Dr. Turner Daniels and ______.  Dr. Langston Masker office will call her with any further appointments, and it is noted Dr. Dietrich Pates mentioned to the patient some type of bone scan prior to her discharge.  However, this was not ordered.  Patient does not wish to stay for the bone scan.  Thus, this will be discussed with Dr. Dietrich Pates and the office will call her at home with any necessary follow-up. DD:  10/21/99 TD:  10/21/99 Job: [id number]

## 2010-08-29 NOTE — Op Note (Signed)
NAME:  [name], [name]             ACCOUNT NO.:  [id number]   MEDICAL RECORD NO.:  [id number]          PATIENT TYPE:  AMB   LOCATION:  ENDO                         FACILITY:  [facility]   PHYSICIAN:  Anselmo Rod, M.D.  DATE OF BIRTH:  November 27, 1940   DATE OF PROCEDURE:  06/18/2006  DATE OF DISCHARGE:                               OPERATIVE REPORT   PROCEDURE PERFORMED:  Screening colonoscopy.   ENDOSCOPIST:  Anselmo Rod, M.D.   INSTRUMENT USED:  Pentax video colonoscope.   INDICATIONS FOR PROCEDURE:  A 70 year old white female undergoing  screening colonoscopy.  Patient has occasional rectal bleeding, which  she attributes to her hemorrhoids.  She also has a history of IBS.  Rule  out colonic polyps, masses, etc.   PRE-PROCEDURE PREPARATION:  Informed consent was procured from the  patient.  The patient was fasted for four hours prior to the procedure  and prepped with 20 OsmoPrep pills the night and 12 OsmoPrep the morning  of the procedure.  Risks and benefits of the procedure, including a 10%  missed rate of cancer and polyps, were discussed with the patient as  well.   PRE-PROCEDURE PHYSICAL EXAMINATION:  VITAL SIGNS:  Stable vital signs.  NECK:  Supple.  CHEST:  Clear to auscultation.  HEART:  S1 and S2.  Regular.  ABDOMEN:  Soft with normal bowel sounds.   DESCRIPTION OF PROCEDURE:  The patient was placed in the left lateral  decubitus position and sedated with 100 mcg of fentanyl and 10 mg of  Versed, given intravenously in slow, incremental doses. Once the patient  was adequately sedated and maintained on low flow oxygen, and continuous  cardiac monitoring, the Pentax video colonoscope was advanced from the  rectum to the cecum.  There was some residual stool in the dependent  areas of the colon. Multiple washings were done, and the appendiceal  orifice and ileocecal valve were clearly visualized and photographed.  The terminal ileum appeared healthy without lesion.   A few sigmoid  diverticula were noted.  Small internal hemorrhoids were noted on  retroflexion.  No masses or polyps were identified.  Small lesions could  be missed.  The patient tolerated the procedure well without immediate  complications.   IMPRESSION:  1. Small internal hemorrhoids seen on retroflexion.  2. A large amount of residual stool in the colon.  3. A few early sigmoid diverticula.   RECOMMENDATIONS:  1. Continue a high-fiber diet with liberal fluid intake.  2. Repeat colonoscopy in the next five years.  If the patient develops      any abnormal symptoms in the interim, she should contact the office      immediately for further recommendations.  3. Outpatient followup as the needs arise in the future.  4. If the patient continues to have problems with rectum, Anusol      suppositories may be tried 1 p.r. nightly. This will be called into      her pharmacy, #30 with 11 refills.      Anselmo Rod, M.D.  Electronically Signed     JNM/MEDQ  D:  06/18/2006  T:  06/18/2006  Job:  [id number]   cc:   [name]. [name], MD

## 2010-08-29 NOTE — Discharge Summary (Signed)
NAME:  [name], [name]                       ACCOUNT NO.:  [id number]   MEDICAL RECORD NO.:  [id number]                   PATIENT TYPE:  INP   LOCATION:  A306                                 FACILITY:  [facility]   PHYSICIAN:  [name]. [name], M.D.                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   CHIEF COMPLAINT:  Cold symptoms.   HISTORY:  The patient was experiencing cough, runny nose, generalized  malaise, decreased appetite, wheezing, sore throat, and headache over 1  week.  The cough was dry most of the time.  At other times the cough was  productive.  Sputum was described as gray and thick.  Appetite had also been  decreased x1 week.  Runny nose had been present x1 day and it was clear.  The patient experienced diarrhea x1 day (Easter Sunday), nausea and vomiting  off and on x1 week and generalized malaise x11 days.   MEDICAL HISTORY:  Medical history was positive for chronic bronchitis,  hypertension, hypothyroidism, osteoarthritis of knees and hyperlipidemia.   ALLERGIES:  The patient was allergic to CODEINE.   HABITS:  Habits were positive for former cigarette smoking x30 years  (started at age 36; smoking 1-1/2 pack of cigarettes per day at the time of  cessation) and negative for the use of ethanol.   PAST MEDICAL HISTORY:  1. Past medical history positive for hospitalization at [facility]     in July 2002 for chest pain.  2. Appendectomy in 1971 by Dr. Leona Carry at [facility].  3. Cholecystectomy in [place].  4. Hemorrhoidectomy.  5. Stress fracture of both feet in 2001 at [facility].  6. Total abdominal hysterectomy secondary to menometrorrhagia in 1979 by Dr.  __________ .   FAMILY HISTORY:  The patient's family history was as follows:  1. Mother deceased, age 8, secondary to heart attack.  2. Father deceased, cause unknown.  3. One brother living, age 26, with history of diabetes mellitus.  4. Three brothers deceased secondary to heart disease, age 62; secondary to     gunshot wound, age 22; secondary to respiratory disease.  5. Five sisters living, age 23, with a history of arthritis; age 43 with a     history of diabetes mellitus; age 19 with health unknown; age 69 with a     history of diabetes mellitus; and age 27 good health.  6. One son living, age 37, good health.  7. Two daughters living, age 68, good health; and age 38 with a history of     fibromyalgia.   COURSE IN HOSPITAL BY PROBLEMS:  Problem #1:  EXACERBATION OF CHRONIC  BRONCHITIS SECONDARY TO ACUTE RESPIRATORY INFECTION.  Vitals on admission  were as follows: Temperature was 97.5, respirations 24, pulse 68, blood  pressure 98/63 and O2 saturation 97% on room air.  General appearance  revealed a middle-age, over weight, medium height, alert white female in no  apparent respiratory distress.  The patient, however, appeared not to feel  well.  Nose was negative for discharge.  Posterior pharynx was benign.  Lungs were clear.  Heart exam revealed audible S1, S2 without murmur.  Rhythm was regular and the rate was within normal limits.  Abdomen obese and  positive old healed right upper quadrant surgical scar and old healed  midhypogastric surgical scar. Abdomen was soft and nontender in all 4  quadrants.  Abdominal exam demonstrated no palpable mass or organomegaly.  The patient was neurologically intact.   Significant labs on admission were as follows:  White count 6.6, hemoglobin  13.1, hematocrit 38.6, platelets 313,000.  Sodium 143, potassium 4.3,  chloride 112, CO2 24, glucose 103, BUN 16, creatinine 1.1.  Chest x-ray was  read as mild cardiomegaly which appeared to be  stable.  Calcified granuloma  was seen in the left hilus--left lung apex.  Generalized peribronchial  thickening was present suggesting a stable chronic bronchitis as read by Dr.  Ronney Asters.  Because of the patient's general malaise and presenting  symptoms of inability to keep down liquids she was admitted for IV fluids,  antibiotics and other supportive therapy.  She was treated with Rocephin 1  gm IV daily, Zithromax Dosepak, Tessalon Perles 100 mg p.o. q.8h. for cough,  Tylenol for temperature, Phenergan IV q.6h. for nausea, Advair 2 puffs  b.i.d., Atrovent metered dose inhaler 2 puffs q.i.d., clear liquid diet, O2  saturation q.6h.  The patient responded to therapy.  Lung fields were clear  at the time of discharge.  She was oriented to person, place, and time at  the time of discharge.  Appetite was improving.  She was keeping down solid  food at the time of discharge.  She was discharged to her home where she  lived with her husband.    Problem #2:  HYPOTHYROIDISM.  Thyroid panel on admission demonstrated the  following:  T4 10.4 mcg/dL, T3 uptake 16.1%, TSH 0.960 units/mL.  Examination of the neck demonstrated no thyromegaly.  The patient had no  complaint of dysphagia during this hospitalization.  She was resumed on  Synthroid 50 mcg p.o. every day.   Problem #3:  ATHEROSCLEROTIC HEART DISEASE.  Cardiac catheterization in July  2002 revealed normal left main artery, a 30% proximal LAD stenosis, and a 60-  70% mild __________ stenosis; the second diagonal branch had a 40% ostial  stenosis and no coronary artery disease of the right coronary artery;  ejection fraction was 55%.  Cardiolite study in December 2002 revealed an  ejection fraction of 69%, reversible distal anterior apical and distal  anterolateral perfusion defect which was suspicious for ischemia.  The patient was continued on nitroglycerin patch 0.4 mg/h every day, atenolol 50  mg p.o. every day, and other  supportive measures.  The patient experienced 1  incident of chest pain during this hospitalization on July 18, 2003.  Cardiac enzymes collected at 1718 on July 18, 2003 reveled the following:  Total CPK 78, CK/MB 1.0, and troponin I 0.01.  The patient was also resumed  on oxygen and sublingual nitroglycerin at the time of complaint of chest  pain. She had  no complaint of chest pain at the time of discharge.   Problem #4:  GASTROESOPHAGEAL REFLUX DISEASE. The patient was treated with  Prevacid 30 mg p.o. daily.   Problem #5:  DIVERTICULOSIS.  The patient was observed and received no  medical intervention.   Problem #6:  DEGENERATIVE JOINT DISEASE.  The patient was treated with  Tylenol as needed for pain   Problem #7:  HYPERTENSION.  Blood pressure on admission was 98/63.  Heart  exam revealed audible S1-S2 without murmurs.  Rhythm was regular and rate  within normal limits.  EKG demonstrated on July 16, 2003 normal sinus  rhythm, normal EKG as read by Dr. Shaune Pollack.  EKG on July 18, 2003  demonstrated a sinus rhythm with first-degree AV block, moderate voltage  criteria for left ventricular hypertrophy may be a normal variant borderline  EKG unconfirmed.  The patient's lungs were clear at the time of discharge.  Heart exam was within normal limits.  Blood pressure readings were held at  the time because systolic readings appeared to be low.  Systolic readings  some times were in the 90s.  Blood pressure on the morning of discharge  124/56, peripheral pulse was 63.   INSTRUCTIONS AT THE TIME OF DISCHARGE:  1. Diet:  Low sodium.  2. Activity:  Increase slow.  3. Follow up with Dr. [name] x1 week.   DISCHARGE MEDICATIONS:  1. Tessalon Perles 100 mg 1 per q.8h.  2. Zithromax 250 mg 1 tablet p.o. every day x4 days.  3. Nitroglycerin patch 0.4 mg per hour 1 patch every day.  4. Advair Diskus 100/50 1 puff q.12h.  5. Atrovent metered dose inhaler 2 puffs q.6h.  6. Zyrtec 10 mg 1  tablet every day.  7. Zocor 40 mg 1 tablet at bedtime.  8. Prevacid 30 mg 1 capsule p.o. every day.  9. K-Dur 20 mEq 1 tablet p.o. every day.  10.      Lasix 40 mg 1 tablet p.o. every day.  11.      Verapamil 240 mg 1/2 tablet p.o. every day.  12.      Avapro 150 mg p.o. every day.  13.      Robitussin DM 2 teaspoons p.o. q.4-6h.  14.      Atenolol 50 mg 1 tablet p.o. every day.   FINAL PRIMARY DIAGNOSIS:  Exacerbation of chronic bronchitis secondary to  acute respiratory infection.   SECONDARY DIAGNOSES:  1. Hyperlipidemia.  2. Hypertension.  3. Hypothyroidism.  4. Atherosclerotic heart disease.  5. Gastroesophageal reflux disease.  6. Diverticulosis.  7. Degenerative joint disease.     ___________________________________________                                         [name]. [name], M.D.   Levonne Hubert  D:  07/21/2003  T:  07/21/2003  Job:  [id number]

## 2010-08-29 NOTE — H&P (Signed)
NAME:  [name], [name]                       ACCOUNT NO.:  [id number]   MEDICAL RECORD NO.:  [id number]                   PATIENT TYPE:  INP   LOCATION:  A306                                 FACILITY:  [facility]   PHYSICIAN:  [name]. [name], M.D.                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   CHIEF COMPLAINT:  Cold symptoms.   HISTORY OF PRESENT ILLNESS:  The patient is experiencing cough, runny nose,  general malaise, decreased appetite, wheezing, sore throat and headache over  one week.  Cough is dry most of the time; however, it can be productive.  Sputum is described as grey and thick.  Appetite has been decreased x 1  week.  Runny nose has been present x 1 day, and it is described as clear.  The patient also experienced diarrhea x 1 day (Easter Sunday), nausea and  vomiting off and on x 1 week, and general malaise x 11 days.  Medical  history is positive for chronic bronchitis, hypertension, hypothyroidism,  obesity, osteoarthritis of knees, and hyperlipidemia.  Medical history is  negative for diabetes, tuberculosis, cancer, cystic fibrosis, or seizure  disorder.   PRESCRIBED MEDICATIONS:  1. Furosemide 40 mg p.o. daily.  2. K-Dur 20 mEq p.o. daily.  3. Atenolol 50 mg p.o. daily.  4. Verapamil SR 240 mg p.o. daily.  5. Zocor 40 mg p.o. every bedtime.  6. Avapro 150 mg p.o. daily.  7. Zyrtec 10 mg p.o. daily.  8. Darvocet-N 100 one tablet p.o. q.12h. p.r.n.  9. Synthroid 50 mcg p.o. every day.  10.      Advair 100/50 one puff q.12h.  11.      Atrovent metered dose inhaler 2 puffs four times a day   ALLERGIES:  CODEINE.   HABITS:  Positive former cigarette smoker x 30 years (started at age 20),  smoking one and  one-half packs per day.  Negative for use of ethanol.   PAST MEDICAL HISTORY:  1. Hospitalization at [facility] in July 2002 for chest pain.  2. Appendectomy in 1971 by Dr. Destefano.  3. Cholecystectomy in [place], [place], in 1981.  4. Hemorrhoidectomy.  5. Stress fracture of both feet in 2001 at [facility].  6. Total abdominal hysterectomy secondary to menorrhagia in 1979 by Dr.     Crook.  7. Cardiac catheterization in July2002 revealing normal left main artery, a     30 % proximal LAD stenosis, and a 60 to 70% mid LAD stenosis.  Second     diagonal branch had a 40% ostial stenosis and known coronary artery     disease of the right coronary artery.  Ejection fraction was 55%.  8. Cardiolite effusion study in December 2002 revealed an ejection fraction     of 69%, reversible distal anterior apical and distal anterolateral     perfusion defect which was suspicious for ischemia.   FAMILY HISTORY:  Mother deceased at age 52 secondary to heart attack; father  deceased, cause unknown; one brother living at age 63 with history of  diabetes mellitus; three brothers deceased, two secondary to heart disease,  age 61 gunshot wound, and age 73 respiratory disease.  Five sisters living:  Age 64 with history of arthritis; age 7 with history of diabetes mellitus;  age 28, health unknown; age 52 with history of diabetes mellitus; age 80 in  good health.  On son living at age 11 in good health and two daughters  living at age 61 in good health, age 91 with fibromyalgia.   REVIEW OF SYSTEMS:  Positive for chronic knee pain, episodic wheezing,  chronic episodic coughing spells, shortness of breath on severe exertion,  episodic wheezing.  Review of Systems is negative epistaxis, hemoptysis,  hematemesis, dysuria, melena, hemochezia, unexplained fever, dysphagia.  Review of Systems is also positive for heartburn and history of frequent  urination related to a kidney stone in the  past.   PHYSICAL EXAMINATION:  VITAL SIGNS:  Temperature 97.5, respirations 24,  pulse 68, blood pressure 98/63, O2 saturation 97% on room air.  GENERAL:  Middle-aged, overweight, medium height, alert white female in no  apparent respiratory distress.  HEENT:  Grossly  within normal limits.  LUNGS:  Clear.  HEART:  Audible S1 and S2 without murmur.  Regular rate and rhythm.  BREASTS:  No skin changes, no nodules by palpation.  Nipples erect without  discharge.  ABDOMEN:  Obese, positive old healed right upper quadrant surgical scar and  old healed mid hypogastric surgical scar.  Soft, nontender in all four  quadrants.  No palpable masses or organomegaly.  PELVIC:  Deferred.  RECTAL:  No external lesions.  Digital exam:  No rectal vault masses.  Stool  guaiac negative.  EXTREMITIES:  No tibial edema.  Knees have positive crepitus bilaterally  with flexion and extension.  Feet:  Palpable dorsalis pedis bilaterally.  NEUROLOGIC:  Alert and oriented to person, place, and time.  Cranial nerves  II-XII appeared intact.   LABORATORY AND X-RAY DATA:  White count 6.6, hemoglobin 13.1, hematocrit  38.6, platelets 313,000.  Sodium 143, potassium 4.3, chloride 112, CO2 24,  glucose 103, BUN 16,creatinine 1.1.   IMPRESSION:  Exacerbation of chronic bronchitis secondary to acute  respiratory infection.   SECONDARY DIAGNOSES:  1. Hypertension.  2. Hypothyroidism.  3. Atherosclerotic heart disease.  4. Gastroesophageal reflux disease.  5. Diverticulosis.  6. Degenerative joint disease.   PLAN:  1. IV normal saline at 50 ml per hour.  2. Diet: Clear liquid because of nausea and vomiting.  3. Labs:  MET-7, CBC, urinalysis, thyroid panel, sputum Gram stain and     culture.  4. Bedside commode.  5. O2 saturation q.6h. x 72 hours.  6. Chest x-ray.  7. EKG.   MEDICATIONS:  1. Rocephin 1 g IV q.24h.  2. Zithromax 1 Dose-Pak.  3. Tessalon Perles 100 mg p.o. q.8h. for cough. 4. Tylenol 650  mg p.o. q.4-6h. for temperature 100 degrees F p.r.n.  5. Phenergan 12.5 mg IV q.6h. p.r.n. for nausea.  6. Nitroglycerin patch 0.4 mg per hours every 24 hours.  7. Synthroid 50 mcg p.o. daily.  8.  Advair 1 puff b.i.d.  9. Atrovent metered dose inhaler four times a day.  10.      Zyrtec 10 mg p.o. daily.  11.      Zocor 40 mg p.o. each bedtime.  12.      Prevacid 30 mg p.o. daily.  13.      K-Dur 20 mEq p.o. daily.  14.      Verapamil SR 240 mg p.o. daily.  15.      Furosemide 40 mg p.o. daily.  16.      Atenolol 50 mg p.o. daily.     ___________________________________________                                         [name]. [name], M.D.   Levonne Hubert  D:  07/16/2003  T:  07/16/2003  Job:  [id number]

## 2010-08-29 NOTE — Procedures (Signed)
[facility]  Patient:    [name], [name] Visit Number: [id number] MRN: [id number]          Service Type: MED Location: 2A A223 01 Attending Physician:  Syliva Overman Dictated by:   Delton See, P.A. Admit Date:  03/16/2001 Discharge Date: 03/18/2001   CC:         Elliot Cousin, M.D.  [place] Bing, M.D.   Stress Test  DATE OF BIRTH:  [birth year]  BRIEF HISTORY:  Ms. Robart is a 70 year old female with a history of coronary artery disease by previous catheterization in July of 2002.  She has been treated medically for her coronary disease.  She had a previous Cardiolite that did show apico-lateral ischemia in July of 2002, however, further intervention was not felt to be indicated at that time.  Other history for the patient is that of a 2-D echo performed in March of 2000 revealing an ejection fraction of 60%.  She has elevated lipids, hypertension, hypothyroidism, history of gastritis, history of asthmatic bronchitis.  She has mildly elevated liver function tests.  The patient was admitted for chest pain.  She is having an adenosine Cardiolite today to further evaluate her symptoms.  It is felt that this will probably be positive for ischemia, however, if it is no worse than the previous studies, no further workup will be initiated.  Prior to this study, the patient had some mild chest pain.  Her baseline EKG showed sinus rhythm, rate 61 beats per minute, with no definite ischemia. Blood pressure is 140/80.  DESCRIPTION OF PROCEDURE:  Adenosine was administered minutes one through six. Cardiolite was given at three minutes.  The patient did have chest pain and shortness of breath during this study.  She also had PVCs as well as pairs of PVCs and occasional pauses.  There were inferolateral T wave inversions during this study.  These symptoms and the EKG changes resolved in recovery.  The final images are pending at time of this  dictation. Dictated by:   Delton See, P.A. Attending Physician:  Syliva Overman DD:  03/18/01 TD:  03/21/01 Job: [id number]

## 2010-08-29 NOTE — Procedures (Signed)
   NAME:  [name], [name]                       ACCOUNT NO.:  [id number]   MEDICAL RECORD NO.:  [id number]                   PATIENT TYPE:  EMS   LOCATION:  ED                                   FACILITY:  [facility]   PHYSICIAN:  Edward L. Juanetta Gosling, M.D.             DATE OF BIRTH:  April 03, 1941   DATE OF PROCEDURE:  09/30/2002  DATE OF DISCHARGE:  10/01/2002                                EKG INTERPRETATION   DATE AND TIME OF TEST:  September 30, 2002 at 2151.   FINDINGS:  The rhythm is sinus rhythm with a rate in the 70s.  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  Abnormal electrocardiogram.                                               Oneal Deputy. Juanetta Gosling, M.D.    ELH/MEDQ  D:  10/01/2002  T:  10/02/2002  Job:  [id number]

## 2010-08-29 NOTE — Cardiovascular Report (Signed)
[facility]  Patient:    [name], [name]                       MRN: [id number] Proc. Date: 09/26/99 Adm. Date:  [id number] Disc. Date: [id number] Attending:  Daisey Must CC:         Kingsley Callander. Nicki Reaper, M.D., [place], [place]             Gerrit Friends. Dietrich Pates, M.D. [facility]             Cardiac Catheterization Lab                        Cardiac Catheterization  PROCEDURES PERFORMED: 1. Left heart catheterization with coronary angiography and left    ventriculography. 2. Intravascular ultrasound of the left circumflex coronary artery as part of    a Reversal protocol.  INDICATIONS:  [name] is a 70 year old woman who was enrolled in the Reversal study.  She returns today for protocol-mandated 93-month catheterization and intravascular ultrasound.  PROCEDURAL NOTE:  A 7-French sheath was placed in the right femoral artery. We used standard Judkins 6-French catheters for the diagnostic catheterization.  Contrast was Omnipaque.  After the diagnostic catheterization, we proceeded to intravascular ultrasound.  Heparin was administered to achieve an ACT of greater than 200 seconds.  We used a 7-French JL4 guiding catheter and a Hi-Torque Floppy wire.  A 3.2 Ultra-Cross intravascular ultrasound catheter was advanced over the wire and positioned in the distal portion of the circumflex.  Automatic pullback was performed, and intravascular ultrasound images were obtained and recorded.  At the conclusion of the procedure, the guiding catheter was removed.  A Perclose vascular closure device was placed in the right femoral artery with good hemostasis. There were no complications.  CATHETERIZATION RESULTS:  Hemodynamics:  Left ventricular pressure 140/20.  Aortic pressure 140/80.  The is no aortic valve gradient.  Left ventriculogram:  Wall motion is normal.  Ejection fraction is greater than 65%.  Coronary arteriography (left dominant): 1. Left main:   Normal. 2. The LAD has a 20% stenosis in the proximal portion of the mid vessel    followed by a 50% stenosis in the mid vessel just after the origin of a    second diagonal branch.  This is stable compared to previous    catheterization.  There is a small first diagonal and a normal-sized second    diagonal.  The second diagonal has a 30% stenosis at its origin. 3. The left circumflex is a large dominant vessel.  It gives rise to a small    OM-1, a large branching OM-2, a normal sized first posterolateral branch,    a small second posterolateral branch, and a normal sized left posterior    descending artery.  The left circumflex is free of angiographic disease. 4. The right coronary artery is a small nondominant vessel.  It is free of    angiographic disease.  Intravascular ultrasound of the left circumflex revealed essentially normal lumen with only mild intimal thickening in the proximal vessel.  No significant atherosclerosis was seen.  CONCLUSIONS: 1. Normal left ventricular systolic function. 2. Moderate nonobstructive one-vessel coronary artery disease as described    involving the mid left anterior descending artery.  The patients anatomy    is stable compared to previous catheterization 18 months ago.DD:  09/26/99 TD:  09/30/99 Job: [id number]

## 2010-08-29 NOTE — Discharge Summary (Signed)
[name], [name]                          ACCOUNT NO.:  [id number]   MEDICAL RECORD NO.:  [id number]                   PATIENT TYPE:  INP   LOCATION:  A302                                 FACILITY:  [facility]   PHYSICIAN:  Jerolyn Shin C. Katrinka Blazing, M.D.                DATE OF BIRTH:  Apr 02, 1941   DATE OF ADMISSION:  12/01/2001  DATE OF DISCHARGE:  12/03/2001                                 DISCHARGE SUMMARY   DISCHARGE DIAGNOSES:  1. Sigmoid diverticulosis.  2. Small internal hemorrhoids.  3. Atherosclerotic heart disease.  4. Hypertension.  5. Chronic asthmatic bronchitis.  6. Gastroesophageal reflux disease.  7. Degenerative joint disease.  8. Hypothyroidism.   SPECIAL PROCEDURE:  Total colonoscopy on December 02, 2001.   DISPOSITION:  The patient discharged home in stable and satisfactory  condition with no evidence of bleeding.  She will have follow-up with Dr.  Sherrie Mustache for her chronic medical conditions and I will see her on a p.r.n.  basis if she should have episodes of rebleeding.   MEDICATIONS:  She was given Anusol suppositories to use t.i.d. for three  months.   She is to continue her chronic medications which are:  1. Furosemide 40 mg q.d.  2. K-Dur 20 mEq q.d.  3. Atenolol 50 mg q.d.  4. Verapamil SR 240 mg q.d.  5. Zocor 40 mg q.h.s.  6. Avapro 300 mg q.d.  7. Zyrtec 10 mg q.d.  8. Klonopin 1 mg q.h.s.  9. Darvocet-N 100 q.6h. p.r.n.  10.      Nitrolingual spray p.r.n.  11.      Nitroglycerin patch daily.  12.      Enteric-coated aspirin 325 mg q.d.  13.      Synthroid 50 mg q.d.  14.      Percocet 30 mg q.d.  15.      Advair 100/50 q.12h.  16.      Atrovent MDI two puffs b.i.d.   SUMMARY:  A 70 year old female who was referred by Dr. Sherrie Mustache for evaluation  of rectal bleeding.  She states that she has had rectal bleeding for many  months.  She has had periods of constipation followed by periods of  diarrhea.  She states that she had increased pressure and pain  with bowel  movements.  She had bleeding with every bowel movement.  She states that she  had severe pain when she had a bowel movement.  In the office she was noted  to have a mildly tender abdomen with guaiac positive stool and a  questionable rectal mass.  She had active bleeding and she was admitted to  undergo bowel prep and colonoscopy.   PAST HISTORY:  Positive for coronary artery disease, hypertension,  bronchitis, peptic ulcer disease, gastroesophageal reflux disease,  hypothyroidism, and degenerative joint disease.   HOSPITAL COURSE:  The patient was admitted and started on a bowel prep.  She  was seen in consultation by Dr. Sherrie Mustache, who was her medical doctor, so that  she could manage her multiple medical problems.  She tolerated her bowel  prep quite nicely.  Colonoscopy was done on August 22 and this only showed  sigmoid diverticulosis with small internal hemorrhoids.  No mucosal lesions  were noted.  No other rectal lesions were noted.  Echocardiogram was done  during this hospitalization and showed left ventricular hypertrophy with an  ejection fraction of greater than 60% with minimal mitral regurgitation.  Hemoglobin was stable.  By August 23 she was stable, she had no abdominal  pain, no rectal bleeding, hemoglobin was 11.  It was felt that she could be  discharged home and follow up with Dr. Sherrie Mustache for her chronic medical  conditions.                                               Dirk Dress. Katrinka Blazing, M.D.    LCS/MEDQ  D:  01/08/2002  T:  01/09/2002  Job:  [id number]

## 2010-09-08 ENCOUNTER — Other Ambulatory Visit (HOSPITAL_COMMUNITY): Payer: Self-pay | Admitting: Family Medicine

## 2010-09-08 DIAGNOSIS — Z139 Encounter for screening, unspecified: Secondary | ICD-10-CM

## 2010-09-11 DIAGNOSIS — I2510 Atherosclerotic heart disease of native coronary artery without angina pectoris: Secondary | ICD-10-CM | POA: Insufficient documentation

## 2010-09-11 DIAGNOSIS — M1990 Unspecified osteoarthritis, unspecified site: Secondary | ICD-10-CM

## 2010-09-11 DIAGNOSIS — N181 Chronic kidney disease, stage 1: Secondary | ICD-10-CM

## 2010-09-15 ENCOUNTER — Encounter: Payer: Self-pay | Admitting: Cardiology

## 2010-09-15 ENCOUNTER — Ambulatory Visit (INDEPENDENT_AMBULATORY_CARE_PROVIDER_SITE_OTHER): Payer: Medicare Other | Admitting: Cardiology

## 2010-09-15 DIAGNOSIS — I10 Essential (primary) hypertension: Secondary | ICD-10-CM

## 2010-09-15 DIAGNOSIS — E039 Hypothyroidism, unspecified: Secondary | ICD-10-CM

## 2010-09-15 DIAGNOSIS — I2510 Atherosclerotic heart disease of native coronary artery without angina pectoris: Secondary | ICD-10-CM

## 2010-09-15 DIAGNOSIS — E785 Hyperlipidemia, unspecified: Secondary | ICD-10-CM

## 2010-09-15 DIAGNOSIS — M1990 Unspecified osteoarthritis, unspecified site: Secondary | ICD-10-CM

## 2010-09-15 DIAGNOSIS — I459 Conduction disorder, unspecified: Secondary | ICD-10-CM

## 2010-09-15 NOTE — Assessment & Plan Note (Addendum)
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

## 2010-09-15 NOTE — Patient Instructions (Signed)
Your physician recommends that you schedule a follow-up appointment in: 1 year  

## 2010-09-15 NOTE — Assessment & Plan Note (Addendum)
Blood pressure control is excellent; current medications will be continued.  I will plan to see this nice woman again in one year.

## 2010-09-15 NOTE — Assessment & Plan Note (Signed)
Repeat lipid profile will be obtained by patient's PCP in the near future and forwarded to me for review.

## 2010-09-15 NOTE — Progress Notes (Signed)
HPI : [name] returns to the office for continued assessment and treatment of coronary disease and cardiovascular risk factors.  Since her last visit, she has done superbly.  She has traveled extensively without any new medical problems or much in the way of symptoms.  She has class II dyspnea on exertion but denies chest discomfort, lightheadedness, palpitations, syncope, orthopnea or PND.  Blood pressure control has been good as far she knows.  Current Outpatient Prescriptions on File Prior to Visit  Medication Sig Dispense Refill  . amLODipine (NORVASC) 10 MG tablet Take 10 mg by mouth daily.        Marland Kitchen aspirin 81 MG tablet Take 81 mg by mouth daily.        . furosemide (LASIX) 40 MG tablet Take 40 mg by mouth as needed. Take 1/2 tab every other day      . lisinopril (PRINIVIL,ZESTRIL) 20 MG tablet Take 20 mg by mouth daily.        Marland Kitchen omeprazole (PRILOSEC) 20 MG capsule Take 20 mg by mouth daily.        . pravastatin (PRAVACHOL) 40 MG tablet Take 40 mg by mouth daily.           Allergies  Allergen Reactions  . Codeine       Past medical history, social history, and family history reviewed and updated.  ROS: See history of present illness.  PHYSICAL EXAM: BP 128/73  Pulse 79  Ht 5\' 2"  (1.575 m)  Wt 229 lb (103.874 kg)  BMI 41.88 kg/m2  SpO2 97%  General-Well developed; no acute distress Body habitus-obese Neck-No JVD; no carotid bruits Lungs-clear lung fields; resonant to percussion Cardiovascular-normal PMI; normal S1 and S2; S4 present Abdomen-normal bowel sounds; soft and non-tender without masses or organomegaly Musculoskeletal-No deformities, no cyanosis or clubbing Neurologic-Normal cranial nerves; symmetric strength and tone Skin-Warm, no significant lesions Extremities-distal pulses intact; no edema  ASSESSMENT AND PLAN:

## 2010-09-15 NOTE — Assessment & Plan Note (Addendum)
Patient has no symptoms to suggest progression of coronary disease.  Echocardiogram and stress nuclear study performed last year were consistent with previous infarction in the distribution of the left anterior descending coronary artery; however, the presence of left bundle branch could mimic that entity.  There is no evidence for ischemia or disease in a territory other than the vessel in which she has already had known stenosis.

## 2010-10-14 ENCOUNTER — Ambulatory Visit (HOSPITAL_COMMUNITY)
Admission: RE | Admit: 2010-10-14 | Discharge: 2010-10-14 | Disposition: A | Discharge status: Home / Self Care | Payer: Medicare Other | Source: Ambulatory Visit | Attending: Family Medicine | Admitting: Family Medicine

## 2010-10-14 DIAGNOSIS — Z1231 Encounter for screening mammogram for malignant neoplasm of breast: Secondary | ICD-10-CM | POA: Insufficient documentation

## 2010-10-14 DIAGNOSIS — Z139 Encounter for screening, unspecified: Secondary | ICD-10-CM

## 2011-01-06 LAB — PROTIME-INR
INR: 0.9
INR: 1.1
INR: 1.7 — ABNORMAL HIGH
INR: 1.7 — ABNORMAL HIGH
Prothrombin Time: 13.9
Prothrombin Time: 20.3 — ABNORMAL HIGH

## 2011-01-06 LAB — CBC
HCT: 29.6 — ABNORMAL LOW
Hemoglobin: 10.3 — ABNORMAL LOW
Hemoglobin: 12.3
MCV: 92
Platelets: 200
Platelets: 208
Platelets: 232
RBC: 2.7 — ABNORMAL LOW
RBC: 4.02
RDW: 13.1
RDW: 13.4
WBC: 11 — ABNORMAL HIGH
WBC: 11.8 — ABNORMAL HIGH
WBC: 9.7

## 2011-01-06 LAB — URINE CULTURE: Colony Count: >=100000

## 2011-01-06 LAB — URINALYSIS, ROUTINE W REFLEX MICROSCOPIC
Bilirubin Urine: NEGATIVE
Hgb urine dipstick: NEGATIVE
Ketones, ur: NEGATIVE
Nitrite: POSITIVE — AB
Protein, ur: NEGATIVE
Specific Gravity, Urine: 1.017
Urobilinogen, UA: 1

## 2011-01-06 LAB — DIFFERENTIAL
Eosinophils Absolute: 0.1
Monocytes Relative: 8
Neutro Abs: 4.6

## 2011-01-06 LAB — ABO/RH: ABO/RH(D): O NEG

## 2011-01-06 LAB — COMPREHENSIVE METABOLIC PANEL
Albumin: 3.9
Alkaline Phosphatase: 100
BUN: 13
CO2: 27
Chloride: 106
GFR calc non Af Amer: 55 — ABNORMAL LOW
Potassium: 3.5
Total Bilirubin: 0.5

## 2011-01-06 LAB — CROSSMATCH: Antibody Screen: NEGATIVE

## 2011-01-06 LAB — TYPE AND SCREEN
ABO/RH(D): O NEG
Antibody Screen: NEGATIVE

## 2011-01-06 LAB — URINE MICROSCOPIC-ADD ON

## 2011-01-06 LAB — HEMOGLOBIN AND HEMATOCRIT, BLOOD
HCT: 28.8 — ABNORMAL LOW
Hemoglobin: 9.6 — ABNORMAL LOW

## 2011-04-09 ENCOUNTER — Emergency Department (HOSPITAL_COMMUNITY)
Admission: EM | Admit: 2011-04-09 | Discharge: 2011-04-09 | Disposition: A | Discharge status: Home / Self Care | Payer: Medicare Other | Attending: Emergency Medicine | Admitting: Emergency Medicine

## 2011-04-09 ENCOUNTER — Emergency Department (HOSPITAL_COMMUNITY): Payer: Medicare Other

## 2011-04-09 ENCOUNTER — Encounter (HOSPITAL_COMMUNITY): Payer: Self-pay | Admitting: *Deleted

## 2011-04-09 DIAGNOSIS — R42 Dizziness and giddiness: Secondary | ICD-10-CM | POA: Insufficient documentation

## 2011-04-09 DIAGNOSIS — R5381 Other malaise: Secondary | ICD-10-CM | POA: Insufficient documentation

## 2011-04-09 DIAGNOSIS — E785 Hyperlipidemia, unspecified: Secondary | ICD-10-CM | POA: Insufficient documentation

## 2011-04-09 DIAGNOSIS — I10 Essential (primary) hypertension: Secondary | ICD-10-CM | POA: Insufficient documentation

## 2011-04-09 DIAGNOSIS — R6883 Chills (without fever): Secondary | ICD-10-CM | POA: Insufficient documentation

## 2011-04-09 DIAGNOSIS — R079 Chest pain, unspecified: Secondary | ICD-10-CM | POA: Insufficient documentation

## 2011-04-09 DIAGNOSIS — R5383 Other fatigue: Secondary | ICD-10-CM | POA: Insufficient documentation

## 2011-04-09 DIAGNOSIS — R05 Cough: Secondary | ICD-10-CM | POA: Insufficient documentation

## 2011-04-09 DIAGNOSIS — R112 Nausea with vomiting, unspecified: Secondary | ICD-10-CM | POA: Insufficient documentation

## 2011-04-09 DIAGNOSIS — E860 Dehydration: Secondary | ICD-10-CM | POA: Insufficient documentation

## 2011-04-09 DIAGNOSIS — Z7982 Long term (current) use of aspirin: Secondary | ICD-10-CM | POA: Insufficient documentation

## 2011-04-09 DIAGNOSIS — IMO0001 Reserved for inherently not codable concepts without codable children: Secondary | ICD-10-CM | POA: Insufficient documentation

## 2011-04-09 DIAGNOSIS — B9789 Other viral agents as the cause of diseases classified elsewhere: Secondary | ICD-10-CM | POA: Insufficient documentation

## 2011-04-09 DIAGNOSIS — B349 Viral infection, unspecified: Secondary | ICD-10-CM

## 2011-04-09 DIAGNOSIS — Z79899 Other long term (current) drug therapy: Secondary | ICD-10-CM | POA: Insufficient documentation

## 2011-04-09 DIAGNOSIS — R059 Cough, unspecified: Secondary | ICD-10-CM | POA: Insufficient documentation

## 2011-04-09 DIAGNOSIS — R197 Diarrhea, unspecified: Secondary | ICD-10-CM | POA: Insufficient documentation

## 2011-04-09 DIAGNOSIS — R61 Generalized hyperhidrosis: Secondary | ICD-10-CM | POA: Insufficient documentation

## 2011-04-09 DIAGNOSIS — E039 Hypothyroidism, unspecified: Secondary | ICD-10-CM | POA: Insufficient documentation

## 2011-04-09 LAB — CBC
Platelets: 200 10*3/uL (ref 150–400)
RBC: 4.21 MIL/uL (ref 3.87–5.11)
RDW: 12.8 % (ref 11.5–15.5)
WBC: 3.9 10*3/uL — ABNORMAL LOW (ref 4.0–10.5)

## 2011-04-09 LAB — BASIC METABOLIC PANEL
CO2: 27 mEq/L (ref 19–32)
Chloride: 103 mEq/L (ref 96–112)
Glucose, Bld: 89 mg/dL (ref 70–99)
Potassium: 4.2 mEq/L (ref 3.5–5.1)
Sodium: 141 mEq/L (ref 135–145)

## 2011-04-09 LAB — DIFFERENTIAL
Basophils Absolute: 0 10*3/uL (ref 0.0–0.1)
Lymphocytes Relative: 35 % (ref 12–46)
Lymphs Abs: 1.3 10*3/uL (ref 0.7–4.0)
Neutro Abs: 1.7 10*3/uL (ref 1.7–7.7)

## 2011-04-09 MED ORDER — KETOROLAC TROMETHAMINE 30 MG/ML IJ SOLN
30.0000 mg | Freq: Once | INTRAMUSCULAR | Status: AC
  Administered 2011-04-09: 30 mg via INTRAVENOUS
  Filled 2011-04-09: qty 1

## 2011-04-09 MED ORDER — SODIUM CHLORIDE 0.9 % IV BOLUS (SEPSIS)
1000.0000 mL | Freq: Once | INTRAVENOUS | Status: AC
  Administered 2011-04-09: 1000 mL via INTRAVENOUS

## 2011-04-09 MED ORDER — HYDROCOD POLST-CHLORPHEN POLST 10-8 MG/5ML PO LQCR
5.0000 mL | Freq: Once | ORAL | Status: AC
  Administered 2011-04-09: 5 mL via ORAL
  Filled 2011-04-09: qty 5

## 2011-04-09 NOTE — ED Provider Notes (Signed)
History     CSN: [id number]  Arrival date & time 04/09/11  1119   First MD Initiated Contact with Patient 04/09/11 1307      Chief Complaint  Patient presents with  . Fatigue  . Cough  . Weakness  . Dizziness    (Consider location/radiation/quality/duration/timing/severity/associated sxs/prior treatment) Patient is a 70 y.o. female presenting with cough and weakness. The history is provided by the patient and medical records.  Cough Associated symptoms include chest pain, chills and myalgias. Pertinent negatives include no headaches, no shortness of breath and no eye redness.  Weakness The primary symptoms include nausea and vomiting. Primary symptoms do not include headaches or fever.  Additional symptoms include weakness.   office records reviewed The patient is a 70 year old, female, with a history of hypertension, and atherosclerotic disease, who presents to the emergency department with a nonproductive cough for 3 days.  Diaphoresis, nausea, vomiting, and one episode of diarrhea.  She says she has myalgias.  She denies a fever.  She has not had urinary tract symptoms.  She was seen by her primary care Dr. and sent over here for evaluation.  Past Medical History  Diagnosis Date  . Hypertension   . ASCVD (arteriosclerotic cardiovascular disease)     60% left anterior descending lesion in 2002; stress nuclear in 05/2009-abnormal septal motion  borderline left ventricular size; breast attenuation artifact without evidence for ischemia;  Echocardiogram-EF of 40-45% with paradoxic septal motion;  . Nephrolithiasis   . Hyperlipidemia     CONTROLLED  . Left bundle branch block   . Hypothyroidism   . Obesity   . Anemia      H/H of 10.3/29.6 with a normal MCV in 07/2007 following surgery  . Chronic insomnia   . DJD (degenerative joint disease)     s/p left THA; requires reoperation due to fracture of prosthesis    Past Surgical History  Procedure Date  . Revision total hip  arthroplasty 2009    Left THA 2009  . Appendectomy   . Abdominal hysterectomy      (partial)  . Nose surgery   . Hemorroidectomy   . Colon polyps     removed    No family history on file.  History  Substance Use Topics  . Smoking status: Never Smoker   . Smokeless tobacco: Not on file  . Alcohol Use: No    OB History    Grav Para Term Preterm Abortions TAB SAB Ect Mult Living                  Review of Systems  Constitutional: Positive for chills and diaphoresis. Negative for fever.  HENT: Negative for congestion.   Eyes: Negative for redness.  Respiratory: Positive for cough. Negative for shortness of breath.   Cardiovascular: Positive for chest pain.  Gastrointestinal: Positive for nausea, vomiting and diarrhea. Negative for abdominal pain.  Genitourinary: Negative for dysuria.  Musculoskeletal: Positive for myalgias. Negative for back pain.  Skin: Negative for rash.  Neurological: Positive for weakness and light-headedness. Negative for headaches.    Allergies  Codeine  Home Medications   Current Outpatient Rx  Name Route Sig Dispense Refill  . AMLODIPINE BESYLATE 10 MG PO TABS Oral Take 10 mg by mouth daily.      . ASPIRIN 81 MG PO TABS Oral Take 81 mg by mouth daily.      Marland Kitchen CITRACAL + D PO Oral Take 1 tablet by mouth daily.      Marland Kitchen  FUROSEMIDE 40 MG PO TABS Oral Take 20 mg by mouth every other day. Take 1/2 tab every other day    . HYDROCODONE-ACETAMINOPHEN 7.5-750 MG PO TABS Oral Take 1 tablet by mouth every 6 (six) hours as needed. For pain     . LISINOPRIL 20 MG PO TABS Oral Take 20 mg by mouth at bedtime.     Marland Kitchen NITROGLYCERIN 0.4 MG SL SUBL Sublingual Place 0.4 mg under the tongue every 5 (five) minutes as needed. For chest pain     . OMEPRAZOLE 20 MG PO CPDR Oral Take 20 mg by mouth at bedtime.     Marland Kitchen PRAVASTATIN SODIUM 40 MG PO TABS Oral Take 40 mg by mouth at bedtime.     Marland Kitchen VITAMIN C 500 MG PO TABS Oral Take 500 mg by mouth daily.        BP 113/65   Pulse 94  Temp(Src) 98.5 F (36.9 C) (Oral)  Resp 20  SpO2 97%  Physical Exam  Vitals reviewed. Constitutional: She is oriented to person, place, and time. No distress.       Morbidly obese  HENT:  Head: Normocephalic and atraumatic.  Eyes: Pupils are equal, round, and reactive to light.  Neck: Normal range of motion.  Cardiovascular: Normal rate, regular rhythm and normal heart sounds.   No murmur heard. Pulmonary/Chest: Effort normal and breath sounds normal. No respiratory distress. She has no wheezes. She has no rales.  Abdominal: Soft. She exhibits no distension and no mass. There is no tenderness. There is no rebound and no guarding.  Musculoskeletal: Normal range of motion. She exhibits no edema and no tenderness.  Neurological: She is alert and oriented to person, place, and time. No cranial nerve deficit.  Skin: Skin is warm and dry. No rash noted. No erythema.  Psychiatric: She has a normal mood and affect. Her behavior is normal.    ED Course  Procedures (including critical care time) 70 year old, female, with flulike symptoms.  Presents emergency department.  Her physical examination is unremarkable.  We will perform a chest x-ray, and laboratory testing.  Establish an IV and and treat her symptomatically.  Labs Reviewed  CBC  DIFFERENTIAL  BASIC METABOLIC PANEL   Dg Chest 2 View  04/09/2011  *RADIOLOGY REPORT*  Clinical Data: 70 year old female with cough, weakness and vomiting.  CHEST - 2 VIEW  Comparison: 05/01/2009 and 08/02/2007  Findings: Upper limits normal heart size again noted. Mild peribronchial thickening is stable. There is no evidence of focal airspace disease, pulmonary edema, pulmonary nodule/mass, pleural effusion, or pneumothorax. No acute bony abnormalities are identified.  IMPRESSION: No evidence of active cardiopulmonary disease.  Original Report Authenticated By: Rosendo Gros, M.D.     No diagnosis found.    MDM   bronchitis        Nicholes Stairs, MD 04/11/11 [phone]

## 2011-04-09 NOTE — ED Notes (Addendum)
Pt was sent to ER for further workup by [facility]. Pt reports 3 day hx of viral symptoms. Reports cough, weakness, fatigue. Pt reports she just doesn't feel like doing anything. Pt reports episode of stool incontinence in doctors office. Pt reports mid back pain. Reports she has chest discomfort only when she is coughing.

## 2011-04-15 ENCOUNTER — Other Ambulatory Visit (HOSPITAL_COMMUNITY): Payer: Self-pay | Admitting: Family Medicine

## 2011-04-15 DIAGNOSIS — Z780 Asymptomatic menopausal state: Secondary | ICD-10-CM

## 2011-04-21 ENCOUNTER — Other Ambulatory Visit (HOSPITAL_COMMUNITY): Payer: Medicare Other

## 2011-07-28 ENCOUNTER — Other Ambulatory Visit (HOSPITAL_COMMUNITY): Payer: Self-pay | Admitting: Family Medicine

## 2011-07-28 DIAGNOSIS — Z780 Asymptomatic menopausal state: Secondary | ICD-10-CM

## 2011-08-04 ENCOUNTER — Ambulatory Visit (HOSPITAL_COMMUNITY)
Admission: RE | Admit: 2011-08-04 | Discharge: 2011-08-04 | Disposition: A | Discharge status: Home / Self Care | Payer: Medicare Other | Source: Ambulatory Visit | Attending: Family Medicine | Admitting: Family Medicine

## 2011-08-04 DIAGNOSIS — M899 Disorder of bone, unspecified: Secondary | ICD-10-CM | POA: Insufficient documentation

## 2011-08-04 DIAGNOSIS — Z780 Asymptomatic menopausal state: Secondary | ICD-10-CM

## 2011-09-09 ENCOUNTER — Other Ambulatory Visit (HOSPITAL_COMMUNITY): Payer: Self-pay | Admitting: Family Medicine

## 2011-09-09 DIAGNOSIS — Z139 Encounter for screening, unspecified: Secondary | ICD-10-CM

## 2011-09-18 ENCOUNTER — Ambulatory Visit (INDEPENDENT_AMBULATORY_CARE_PROVIDER_SITE_OTHER): Payer: Medicare Other | Admitting: Adult Health

## 2011-09-18 ENCOUNTER — Encounter: Payer: Self-pay | Admitting: Adult Health

## 2011-09-18 ENCOUNTER — Ambulatory Visit: Payer: Medicare Other | Admitting: Cardiology

## 2011-09-18 VITALS — BP 120/68 | HR 79 | Ht 62.0 in | Wt 220.0 lb

## 2011-09-18 DIAGNOSIS — E785 Hyperlipidemia, unspecified: Secondary | ICD-10-CM

## 2011-09-18 DIAGNOSIS — I2510 Atherosclerotic heart disease of native coronary artery without angina pectoris: Secondary | ICD-10-CM

## 2011-09-18 DIAGNOSIS — I7090 Unspecified atherosclerosis: Secondary | ICD-10-CM

## 2011-09-18 DIAGNOSIS — I10 Essential (primary) hypertension: Secondary | ICD-10-CM

## 2011-09-18 NOTE — Progress Notes (Deleted)
**Note De-Identified [name] Obfuscation** Name: [name]    DOB: May 02, 1940  Age: 71 y.o.  MR#: [id number]       PCP:  Jillian Courier, MD, MD      Insurance: @PAYORNAME @   CC:    Chief Complaint  Patient presents with  . Appointment    chest pain on occasion- edema feet and ankles -  sob     VS BP 120/68  Pulse 79  Ht 5\' 2"  (1.575 m)  Wt 220 lb (99.791 kg)  BMI 40.24 kg/m2  Weights Current Weight  09/18/11 220 lb (99.791 kg)  09/15/10 229 lb (103.874 kg)  11/15/09 245 lb (111.131 kg)    Blood Pressure  BP Readings from Last 3 Encounters:  09/18/11 120/68  04/09/11 111/64  09/15/10 128/73     Admit date:  (Not on file) Last encounter with RMR:  Visit date not found   Allergy Allergies  Allergen Reactions  . Codeine     "feel like going buggy"  Itchy skin    Current Outpatient Prescriptions  Medication Sig Dispense Refill  . amLODipine (NORVASC) 10 MG tablet Take 10 mg by mouth daily.        Marland Kitchen aspirin 81 MG tablet Take 81 mg by mouth daily.        . Calcium Citrate-Vitamin D (CITRACAL + D PO) Take by mouth 2 (two) times daily.       . cyanocobalamin 2000 MCG tablet Take 2,000 mcg by mouth daily.      . furosemide (LASIX) 40 MG tablet Take 40 mg by mouth daily.       Marland Kitchen HYDROcodone-acetaminophen (VICODIN ES) 7.5-750 MG per tablet Take 1 tablet by mouth every 6 (six) hours as needed. For pain       . lisinopril (PRINIVIL,ZESTRIL) 20 MG tablet Take 1 tab twice a day      . nitroGLYCERIN (NITROSTAT) 0.4 MG SL tablet Place 0.4 mg under the tongue every 5 (five) minutes as needed. For chest pain       . omeprazole (PRILOSEC) 20 MG capsule Take 20 mg by mouth at bedtime.       Marland Kitchen OVER THE COUNTER MEDICATION VITAMIN D 3  Gummy bear vitamins  2000 iu T ake 2 tabs daily      . OVER THE COUNTER MEDICATION MEGA D #3 5000 iu i vitamin daily      . OVER THE COUNTER MEDICATION MEGA RED FISH OIL  1 per day      . pravastatin (PRAVACHOL) 40 MG tablet Take 40 mg by mouth at bedtime.         Discontinued Meds:      Medications Discontinued During This Encounter  Medication Reason  . vitamin C (ASCORBIC ACID) 500 MG tablet Error    Patient Active Problem List  Diagnoses  . HYPOTHYROIDISM  . HYPERLIPIDEMIA, CONTROLLED  . ANEMIA  . INSOMNIA, CHRONIC  . HYPERTENSION  . Left bundle branch block  . CHRONIC KIDNEY DISEASE STAGE I  . NEPHROLITHIASIS  . CHEST PAIN-UNSPECIFIED  . ASCVD (arteriosclerotic cardiovascular disease)  . Chronic kidney disease, stage 1, normal or increased GFR  . DJD (degenerative joint disease)    LABS No visits with results within 3 Month(s) from this visit. Latest known visit with results is:  Admission on 04/09/2011, Discharged on 04/09/2011  Component Date Value  . WBC 04/09/2011 3.9*  . RBC 04/09/2011 4.21   . Hemoglobin 04/09/2011 12.9   . HCT 04/09/2011 38.6   . **Note De-Identified [name] Obfuscation** MCV 04/09/2011 91.7   . [facility] 04/09/2011 30.6   . MCHC 04/09/2011 33.4   . RDW 04/09/2011 12.8   . Platelets 04/09/2011 200   . Neutrophils Relative 04/09/2011 43   . Neutro Abs 04/09/2011 1.7   . Lymphocytes Relative 04/09/2011 35   . Lymphs Abs 04/09/2011 1.3   . Monocytes Relative 04/09/2011 21*  . Monocytes Absolute 04/09/2011 0.8   . Eosinophils Relative 04/09/2011 0   . Eosinophils Absolute 04/09/2011 0.0   . Basophils Relative 04/09/2011 1   . Basophils Absolute 04/09/2011 0.0   . Sodium 04/09/2011 141   . Potassium 04/09/2011 4.2   . Chloride 04/09/2011 103   . CO2 04/09/2011 27   . Glucose, Bld 04/09/2011 89   . BUN 04/09/2011 17   . Creatinine, Ser 04/09/2011 1.09   . Calcium 04/09/2011 9.6   . GFR calc non Af Amer 04/09/2011 51*  . GFR calc Af Amer 04/09/2011 59*     Results for this Opt Visit:     Results for orders placed during the hospital encounter of 04/09/11  CBC      Component Value Range   WBC 3.9 (*) 4.0 - 10.5 (K/uL)   RBC 4.21  3.87 - 5.11 (MIL/uL)   Hemoglobin 12.9  12.0 - 15.0 (g/dL)   HCT 16.1  09.6 - 04.5 (%)   MCV 91.7  78.0 - 100.0 (fL)   MCH 30.6   26.0 - 34.0 (pg)   MCHC 33.4  30.0 - 36.0 (g/dL)   RDW 40.9  81.1 - 91.4 (%)   Platelets 200  150 - 400 (K/uL)  DIFFERENTIAL      Component Value Range   Neutrophils Relative 43  43 - 77 (%)   Neutro Abs 1.7  1.7 - 7.7 (K/uL)   Lymphocytes Relative 35  12 - 46 (%)   Lymphs Abs 1.3  0.7 - 4.0 (K/uL)   Monocytes Relative 21 (*) 3 - 12 (%)   Monocytes Absolute 0.8  0.1 - 1.0 (K/uL)   Eosinophils Relative 0  0 - 5 (%)   Eosinophils Absolute 0.0  0.0 - 0.7 (K/uL)   Basophils Relative 1  0 - 1 (%)   Basophils Absolute 0.0  0.0 - 0.1 (K/uL)  BASIC METABOLIC PANEL      Component Value Range   Sodium 141  135 - 145 (mEq/L)   Potassium 4.2  3.5 - 5.1 (mEq/L)   Chloride 103  96 - 112 (mEq/L)   CO2 27  19 - 32 (mEq/L)   Glucose, Bld 89  70 - 99 (mg/dL)   BUN 17  6 - 23 (mg/dL)   Creatinine, Ser 7.82  0.50 - 1.10 (mg/dL)   Calcium 9.6  8.4 - 95.6 (mg/dL)   GFR calc non Af Amer 51 (*) >90 (mL/min)   GFR calc Af Amer 59 (*) >90 (mL/min)    EKG Orders placed in visit on 04/22/09  . CONVERTED CEMR EKG     Prior Assessment and Plan Problem List as of 09/18/2011          Cardiology Problems   HYPERLIPIDEMIA, CONTROLLED   Last Assessment & Plan Note   09/15/2010 Office Visit Signed 09/15/2010  4:58 PM by Jillian Brunswick, MD    Repeat lipid profile will be obtained by patient's PCP in the near future and forwarded to me for review.    HYPERTENSION   Last Assessment & Plan Note   09/15/2010 Office Visit Addendum **Note De-Identified [name] Obfuscation** 09/18/2010 10:00 PM by Jillian Brunswick, MD    Blood pressure control is excellent; current medications will be continued.  I will plan to see this nice woman again in one year.    Left bundle branch block   Last Assessment & Plan Note   09/15/2010 Office Visit Addendum 09/15/2010  2:52 PM by Jillian Brunswick, MD    Bundle branch block present since at least 2004; no progression of conduction system disease since     ASCVD (arteriosclerotic cardiovascular disease)   Last Assessment &  Plan Note   09/15/2010 Office Visit Addendum 09/18/2010  9:59 PM by Jillian Brunswick, MD    Patient has no symptoms to suggest progression of coronary disease.  Echocardiogram and stress nuclear study performed last year were consistent with previous infarction in the distribution of the left anterior descending coronary artery; however, the presence of left bundle branch could mimic that entity.  There is no evidence for ischemia or disease in a territory other than the vessel in which she has already had known stenosis.      Other   HYPOTHYROIDISM   ANEMIA   INSOMNIA, CHRONIC   CHRONIC KIDNEY DISEASE STAGE I   NEPHROLITHIASIS   CHEST PAIN-UNSPECIFIED   Chronic kidney disease, stage 1, normal or increased GFR   DJD (degenerative joint disease)       Imaging: No results found.   FRS Calculation: Score not calculated. Missing: Total Cholesterol

## 2011-09-18 NOTE — Assessment & Plan Note (Signed)
She is having labs completed in 2 days with adjustment in statin should this be necessary.

## 2011-09-18 NOTE — Progress Notes (Signed)
HPI: [name] is a 71 y/o patient of Dr.Rothbart that we are following for annual ongoing assessment and treatment of single vessel CAD, LBBB and multiple CVRF. She has had cataract surgery and one hospitalization for flu and pneumonia. She has had no cardiac complaints. She is complaining of chronic leg cramps. She is due to see Dr. [name] in two days for labs and check up. She denies palpitations or significant DOE.   Allergies  Allergen Reactions  . Codeine     "feel like going buggy"  Itchy skin    Current Outpatient Prescriptions  Medication Sig Dispense Refill  . amLODipine (NORVASC) 10 MG tablet Take 10 mg by mouth daily.        Marland Kitchen aspirin 81 MG tablet Take 81 mg by mouth daily.        . Calcium Citrate-Vitamin D (CITRACAL + D PO) Take by mouth 2 (two) times daily.       . cyanocobalamin 2000 MCG tablet Take 2,000 mcg by mouth daily.      . furosemide (LASIX) 40 MG tablet Take 40 mg by mouth daily.       Marland Kitchen HYDROcodone-acetaminophen (VICODIN ES) 7.5-750 MG per tablet Take 1 tablet by mouth every 6 (six) hours as needed. For pain       . lisinopril (PRINIVIL,ZESTRIL) 20 MG tablet Take 1 tab twice a day      . nitroGLYCERIN (NITROSTAT) 0.4 MG SL tablet Place 0.4 mg under the tongue every 5 (five) minutes as needed. For chest pain       . omeprazole (PRILOSEC) 20 MG capsule Take 20 mg by mouth at bedtime.       Marland Kitchen OVER THE COUNTER MEDICATION VITAMIN D 3  Gummy bear vitamins  2000 iu T ake 2 tabs daily      . OVER THE COUNTER MEDICATION MEGA D #3 5000 iu i vitamin daily      . OVER THE COUNTER MEDICATION MEGA RED FISH OIL  1 per day      . pravastatin (PRAVACHOL) 40 MG tablet Take 40 mg by mouth at bedtime.         Past Medical History  Diagnosis Date  . Hypertension   . ASCVD (arteriosclerotic cardiovascular disease)     60% left anterior descending lesion in 2002; stress nuclear in 05/2009-abnormal septal motion  borderline left ventricular size; breast attenuation artifact  without evidence for ischemia;  Echocardiogram-EF of 40-45% with paradoxic septal motion;  . Nephrolithiasis   . Hyperlipidemia     CONTROLLED  . Left bundle branch block   . Hypothyroidism   . Obesity   . Anemia      H/H of 10.3/29.6 with a normal MCV in 07/2007 following surgery  . Chronic insomnia   . DJD (degenerative joint disease)     s/p left THA; requires reoperation due to fracture of prosthesis    Past Surgical History  Procedure Date  . Revision total hip arthroplasty 2009    Left THA 2009  . Appendectomy   . Abdominal hysterectomy      (partial)  . Nose surgery   . Hemorroidectomy   . Colon polyps     removed    ZOX:WRUEAV of systems complete and found to be negative unless listed above  PHYSICAL EXAM BP 120/68  Pulse 79  Ht 5\' 2"  (1.575 m)  Wt 220 lb (99.791 kg)  BMI 40.24 kg/m2  General: Well developed, well nourished, in no acute distress  Head: Eyes PERRLA, No xanthomas.   Normal cephalic and atramatic  Lungs: Clear bilaterally to auscultation and percussion. Heart: HRRR S1 S2, without MRG.  Pulses are 2+ & equal.            No carotid bruit. No JVD.  No abdominal bruits. No femoral bruits. Abdomen: Bowel sounds are positive, abdomen soft and non-tender without masses or                  Hernia's noted. Msk:  Back normal, normal gait. Normal strength and tone for age. Extremities: No clubbing, cyanosis or edema.  DP +1 Neuro: Alert and oriented X 3. Psych:  Good affect, responds appropriately  EKG:NSR with LBBB, rate of 79 bpm.  ASSESSMENT AND PLAN

## 2011-09-18 NOTE — Patient Instructions (Signed)
**Note De-identified Temperence Zenor Obfuscation** Your physician recommends that you continue on your current medications as directed. Please refer to the Current Medication list given to you today.  Your physician recommends that you schedule a follow-up appointment in: 1 year  

## 2011-09-18 NOTE — Assessment & Plan Note (Signed)
Blood pressure is well controlled at present.  No changes. 

## 2011-09-18 NOTE — Assessment & Plan Note (Signed)
She is without cardiac complaint. She is concerned about frequent cramping in her legs. I see that she is on lasix but no potassium replacement. She has CKD, and is due to have labs completed in Dr. Adaline Sill office in 2 days. I have advised potassium or magnesium tablets daily should this be warranted. No changes in medications otherwise.

## 2011-10-09 ENCOUNTER — Ambulatory Visit (HOSPITAL_COMMUNITY)
Admission: RE | Admit: 2011-10-09 | Discharge: 2011-10-09 | Disposition: A | Discharge status: Home / Self Care | Payer: Medicare Other | Source: Ambulatory Visit | Attending: Family Medicine | Admitting: Family Medicine

## 2011-10-09 ENCOUNTER — Other Ambulatory Visit (HOSPITAL_COMMUNITY): Payer: Self-pay | Admitting: Family Medicine

## 2011-10-09 DIAGNOSIS — M545 Low back pain, unspecified: Secondary | ICD-10-CM | POA: Insufficient documentation

## 2011-11-09 ENCOUNTER — Ambulatory Visit (HOSPITAL_COMMUNITY)
Admission: RE | Admit: 2011-11-09 | Discharge: 2011-11-09 | Disposition: A | Discharge status: Home / Self Care | Payer: Medicare Other | Source: Ambulatory Visit | Attending: Family Medicine | Admitting: Family Medicine

## 2011-11-09 DIAGNOSIS — Z139 Encounter for screening, unspecified: Secondary | ICD-10-CM

## 2011-11-09 DIAGNOSIS — Z1231 Encounter for screening mammogram for malignant neoplasm of breast: Secondary | ICD-10-CM | POA: Insufficient documentation

## 2011-11-12 HISTORY — PX: COLONOSCOPY: SHX174

## 2012-04-22 ENCOUNTER — Emergency Department (HOSPITAL_COMMUNITY): Payer: Medicare Other

## 2012-04-22 ENCOUNTER — Encounter (HOSPITAL_COMMUNITY): Payer: Self-pay | Admitting: *Deleted

## 2012-04-22 ENCOUNTER — Emergency Department (HOSPITAL_COMMUNITY)
Admission: EM | Admit: 2012-04-22 | Discharge: 2012-04-22 | Disposition: A | Discharge status: Home / Self Care | Payer: Medicare Other | Attending: Emergency Medicine | Admitting: Emergency Medicine

## 2012-04-22 DIAGNOSIS — I10 Essential (primary) hypertension: Secondary | ICD-10-CM | POA: Insufficient documentation

## 2012-04-22 DIAGNOSIS — Z79899 Other long term (current) drug therapy: Secondary | ICD-10-CM | POA: Insufficient documentation

## 2012-04-22 DIAGNOSIS — E669 Obesity, unspecified: Secondary | ICD-10-CM | POA: Insufficient documentation

## 2012-04-22 DIAGNOSIS — Z87442 Personal history of urinary calculi: Secondary | ICD-10-CM | POA: Insufficient documentation

## 2012-04-22 DIAGNOSIS — K5289 Other specified noninfective gastroenteritis and colitis: Secondary | ICD-10-CM | POA: Insufficient documentation

## 2012-04-22 DIAGNOSIS — R6883 Chills (without fever): Secondary | ICD-10-CM | POA: Insufficient documentation

## 2012-04-22 DIAGNOSIS — R109 Unspecified abdominal pain: Secondary | ICD-10-CM | POA: Insufficient documentation

## 2012-04-22 DIAGNOSIS — R197 Diarrhea, unspecified: Secondary | ICD-10-CM | POA: Insufficient documentation

## 2012-04-22 DIAGNOSIS — Z8679 Personal history of other diseases of the circulatory system: Secondary | ICD-10-CM | POA: Insufficient documentation

## 2012-04-22 DIAGNOSIS — K529 Noninfective gastroenteritis and colitis, unspecified: Secondary | ICD-10-CM

## 2012-04-22 DIAGNOSIS — Z7982 Long term (current) use of aspirin: Secondary | ICD-10-CM | POA: Insufficient documentation

## 2012-04-22 DIAGNOSIS — E039 Hypothyroidism, unspecified: Secondary | ICD-10-CM | POA: Insufficient documentation

## 2012-04-22 DIAGNOSIS — R112 Nausea with vomiting, unspecified: Secondary | ICD-10-CM | POA: Insufficient documentation

## 2012-04-22 DIAGNOSIS — Z90710 Acquired absence of both cervix and uterus: Secondary | ICD-10-CM | POA: Insufficient documentation

## 2012-04-22 DIAGNOSIS — Z862 Personal history of diseases of the blood and blood-forming organs and certain disorders involving the immune mechanism: Secondary | ICD-10-CM | POA: Insufficient documentation

## 2012-04-22 DIAGNOSIS — E785 Hyperlipidemia, unspecified: Secondary | ICD-10-CM | POA: Insufficient documentation

## 2012-04-22 LAB — CBC WITH DIFFERENTIAL/PLATELET
Basophils Absolute: 0 10*3/uL (ref 0.0–0.1)
Basophils Relative: 0 % (ref 0–1)
Eosinophils Absolute: 0.1 10*3/uL (ref 0.0–0.7)
Eosinophils Relative: 1 % (ref 0–5)
HCT: 40.6 % (ref 36.0–46.0)
Hemoglobin: 13.7 g/dL (ref 12.0–15.0)
MCH: 31.3 pg (ref 26.0–34.0)
MCHC: 33.7 g/dL (ref 30.0–36.0)
MCV: 92.7 fL (ref 78.0–100.0)
Monocytes Absolute: 1 10*3/uL (ref 0.1–1.0)
Monocytes Relative: 10 % (ref 3–12)
RDW: 13 % (ref 11.5–15.5)

## 2012-04-22 LAB — URINALYSIS, ROUTINE W REFLEX MICROSCOPIC
Glucose, UA: NEGATIVE mg/dL
Ketones, ur: NEGATIVE mg/dL
Nitrite: NEGATIVE
Protein, ur: NEGATIVE mg/dL
pH: 5.5 (ref 5.0–8.0)

## 2012-04-22 LAB — COMPREHENSIVE METABOLIC PANEL
AST: 21 U/L (ref 0–37)
Albumin: 4 g/dL (ref 3.5–5.2)
BUN: 17 mg/dL (ref 6–23)
Calcium: 10.1 mg/dL (ref 8.4–10.5)
Creatinine, Ser: 0.89 mg/dL (ref 0.50–1.10)
Total Bilirubin: 0.5 mg/dL (ref 0.3–1.2)

## 2012-04-22 LAB — URINE MICROSCOPIC-ADD ON

## 2012-04-22 LAB — LIPASE, BLOOD: Lipase: 26 U/L (ref 11–59)

## 2012-04-22 MED ORDER — ONDANSETRON HCL 4 MG/2ML IJ SOLN
4.0000 mg | Freq: Once | INTRAMUSCULAR | Status: AC
  Administered 2012-04-22: 4 mg via INTRAVENOUS
  Filled 2012-04-22: qty 2

## 2012-04-22 MED ORDER — OXYCODONE-ACETAMINOPHEN 5-325 MG PO TABS
1.0000 | ORAL_TABLET | Freq: Once | ORAL | Status: AC
  Administered 2012-04-22: 1 via ORAL
  Filled 2012-04-22: qty 1

## 2012-04-22 MED ORDER — ONDANSETRON 8 MG PO TBDP: 8.0000 mg | ORAL_TABLET | Freq: Three times a day (TID) | ORAL | Status: DC | PRN

## 2012-04-22 MED ORDER — HYDROMORPHONE HCL PF 1 MG/ML IJ SOLN
1.0000 mg | Freq: Once | INTRAMUSCULAR | Status: AC
  Administered 2012-04-22: 1 mg via INTRAVENOUS
  Filled 2012-04-22: qty 1

## 2012-04-22 MED ORDER — IOHEXOL 300 MG/ML  SOLN
100.0000 mL | Freq: Once | INTRAMUSCULAR | Status: AC | PRN
  Administered 2012-04-22: 100 mL via INTRAVENOUS

## 2012-04-22 MED ORDER — SODIUM CHLORIDE 0.9 % IV BOLUS (SEPSIS)
1000.0000 mL | Freq: Once | INTRAVENOUS | Status: AC
  Administered 2012-04-22: 1000 mL via INTRAVENOUS

## 2012-04-22 NOTE — ED Notes (Signed)
Patient ambulatory to bathroom to collect urine specimen. 

## 2012-04-22 NOTE — ED Notes (Signed)
Epigastric pain that radiates down abd area, associated with nausea and one episode of diarrhea that started yesterday,

## 2012-04-22 NOTE — ED Provider Notes (Signed)
History     CSN: [id number]  Arrival date & time 04/22/12  1702   First MD Initiated Contact with Patient 04/22/12 1924      Chief Complaint  Patient presents with  . Abdominal Pain    (Consider location/radiation/quality/duration/timing/severity/associated sxs/prior treatment) HPI Patient complaining of epigastric pain radiating to lower abdomen today with alternating sharp and crampy.  Nausea with loose bowel movement x 1 yesterday.  No solids today and drank some pepsi and small amount of water only today.  Pain did not increase with po intake.  No fever, vomiting, cough, uri symptoms, chest pain, uti symptoms.  Patient with nausea and no stool today.  PMD Dr. [name].  Past Medical History  Diagnosis Date  . Hypertension   . ASCVD (arteriosclerotic cardiovascular disease)     60% left anterior descending lesion in 2002; stress nuclear in 05/2009-abnormal septal motion  borderline left ventricular size; breast attenuation artifact without evidence for ischemia;  Echocardiogram-EF of 40-45% with paradoxic septal motion;  . Nephrolithiasis   . Hyperlipidemia     CONTROLLED  . Left bundle branch block   . Hypothyroidism   . Obesity   . Anemia      H/H of 10.3/29.6 with a normal MCV in 07/2007 following surgery  . Chronic insomnia   . DJD (degenerative joint disease)     s/p left THA; requires reoperation due to fracture of prosthesis    Past Surgical History  Procedure Date  . Revision total hip arthroplasty 2009    Left THA 2009  . Appendectomy   . Abdominal hysterectomy      (partial)  . Nose surgery   . Hemorroidectomy   . Colon polyps     removed  . Cholecystectomy     No family history on file.  History  Substance Use Topics  . Smoking status: Never Smoker   . Smokeless tobacco: Not on file  . Alcohol Use: No    OB History    Grav Para Term Preterm Abortions TAB SAB Ect Mult Living                  Review of Systems  Constitutional: Positive for  chills. Negative for fever, activity change, appetite change and unexpected weight change.  HENT: Negative for sore throat, rhinorrhea, neck pain, neck stiffness and sinus pressure.   Eyes: Negative for visual disturbance.  Respiratory: Negative for cough and shortness of breath.   Cardiovascular: Negative for chest pain and leg swelling.  Gastrointestinal: Positive for nausea, vomiting and diarrhea. Negative for abdominal pain and blood in stool.  Genitourinary: Negative for dysuria, urgency, frequency, vaginal discharge and difficulty urinating.  Musculoskeletal: Negative for myalgias, arthralgias and gait problem.  Skin: Negative for color change and rash.  Neurological: Negative for weakness, light-headedness and headaches.  Hematological: Does not bruise/bleed easily.  Psychiatric/Behavioral: Negative for dysphoric mood.    Allergies  Codeine  Home Medications   Current Outpatient Rx  Name  Route  Sig  Dispense  Refill  . AMLODIPINE BESYLATE 10 MG PO TABS   Oral   Take 10 mg by mouth daily.           . ASPIRIN 81 MG PO TABS   Oral   Take 81 mg by mouth daily.           Marland Kitchen CITRACAL + D PO   Oral   Take by mouth 2 (two) times daily.          Marland Kitchen  CYANOCOBALAMIN 2000 MCG PO TABS   Oral   Take 2,000 mcg by mouth daily.         . FUROSEMIDE 40 MG PO TABS   Oral   Take 40 mg by mouth daily.          Marland Kitchen HYDROCODONE-ACETAMINOPHEN 7.5-750 MG PO TABS   Oral   Take 1 tablet by mouth every 6 (six) hours as needed. For pain          . LISINOPRIL 20 MG PO TABS      Take 1 tab twice a day         . NITROGLYCERIN 0.4 MG SL SUBL   Sublingual   Place 0.4 mg under the tongue every 5 (five) minutes as needed. For chest pain          . OMEPRAZOLE 20 MG PO CPDR   Oral   Take 20 mg by mouth at bedtime.          Marland Kitchen OVER THE COUNTER MEDICATION      VITAMIN D 3  Gummy bear vitamins  2000 iu T ake 2 tabs daily         . OVER THE COUNTER MEDICATION      MEGA D #3  5000 iu i vitamin daily         . OVER THE COUNTER MEDICATION      MEGA RED FISH OIL  1 per day         . PRAVASTATIN SODIUM 40 MG PO TABS   Oral   Take 40 mg by mouth at bedtime.            BP 123/75  Pulse 92  Temp 98.3 F (36.8 C)  Resp 20  Ht 5' 2.5" (1.588 m)  Wt 226 lb (102.513 kg)  BMI 40.68 kg/m2  SpO2 96%  Physical Exam  Nursing note and vitals reviewed. Constitutional: She is oriented to person, place, and time. She appears well-developed and well-nourished.  HENT:  Head: Normocephalic and atraumatic.  Right Ear: External ear normal.  Left Ear: External ear normal.  Nose: Nose normal.  Mouth/Throat: Oropharynx is clear and moist.  Eyes: Conjunctivae normal and EOM are normal. Pupils are equal, round, and reactive to light.  Neck: Normal range of motion. Neck supple.  Cardiovascular: Normal rate, regular rhythm and normal heart sounds.   Pulmonary/Chest: Effort normal and breath sounds normal.  Abdominal: Soft. Bowel sounds are normal. There is tenderness.       Diffuse tenderness  Musculoskeletal: Normal range of motion. She exhibits no edema and no tenderness.  Neurological: She is alert and oriented to person, place, and time. She has normal reflexes.  Skin: Skin is warm and dry.  Psychiatric: She has a normal mood and affect. Her behavior is normal. Judgment and thought content normal.    ED Course  Procedures (including critical care time)  Labs Reviewed - No data to display No results found.   No diagnosis found.   Results for orders placed during the hospital encounter of 04/22/12  CBC WITH DIFFERENTIAL      Component Value Range   WBC 10.2  4.0 - 10.5 K/uL   RBC 4.38  3.87 - 5.11 MIL/uL   Hemoglobin 13.7  12.0 - 15.0 g/dL   HCT 91.4  78.2 - 95.6 %   MCV 92.7  78.0 - 100.0 fL   MCH 31.3  26.0 - 34.0 pg   MCHC 33.7  30.0 - 36.0  g/dL   RDW 16.1  09.6 - 04.5 %   Platelets 277  150 - 400 K/uL   Neutrophils Relative 60  43 - 77 %    Neutro Abs 6.1  1.7 - 7.7 K/uL   Lymphocytes Relative 29  12 - 46 %   Lymphs Abs 3.0  0.7 - 4.0 K/uL   Monocytes Relative 10  3 - 12 %   Monocytes Absolute 1.0  0.1 - 1.0 K/uL   Eosinophils Relative 1  0 - 5 %   Eosinophils Absolute 0.1  0.0 - 0.7 K/uL   Basophils Relative 0  0 - 1 %   Basophils Absolute 0.0  0.0 - 0.1 K/uL  URINALYSIS, ROUTINE W REFLEX MICROSCOPIC      Component Value Range   Color, Urine YELLOW  YELLOW   APPearance CLEAR  CLEAR   Specific Gravity, Urine 1.025  1.005 - 1.030   pH 5.5  5.0 - 8.0   Glucose, UA NEGATIVE  NEGATIVE mg/dL   Hgb urine dipstick NEGATIVE  NEGATIVE   Bilirubin Urine NEGATIVE  NEGATIVE   Ketones, ur NEGATIVE  NEGATIVE mg/dL   Protein, ur NEGATIVE  NEGATIVE mg/dL   Urobilinogen, UA 0.2  0.0 - 1.0 mg/dL   Nitrite NEGATIVE  NEGATIVE   Leukocytes, UA SMALL (*) NEGATIVE  URINE MICROSCOPIC-ADD ON      Component Value Range   Squamous Epithelial / LPF FEW (*) RARE   WBC, UA 3-6  <3 WBC/hpf   Bacteria, UA RARE  RARE    MDM  Patient remains comfortable here.  I discussed ct results with her and she wishes to be discharged home.  She is taking po well and advised of s/s to return for      Hilario Quarry, MD 04/24/12 1941

## 2012-05-24 ENCOUNTER — Ambulatory Visit (INDEPENDENT_AMBULATORY_CARE_PROVIDER_SITE_OTHER): Payer: Medicare Other | Admitting: Gastroenterology

## 2012-05-24 ENCOUNTER — Encounter: Payer: Self-pay | Admitting: Gastroenterology

## 2012-05-24 VITALS — BP 119/72 | HR 76 | Temp 97.4°F | Ht 62.0 in | Wt 225.0 lb

## 2012-05-24 DIAGNOSIS — R1013 Epigastric pain: Secondary | ICD-10-CM

## 2012-05-24 NOTE — Progress Notes (Signed)
Faxed to PCP

## 2012-05-24 NOTE — Assessment & Plan Note (Addendum)
72 year old female with new-onset epigastric pain worsened by eating, associated with nausea. Radiates into upper chest and lower abdomen. No changes in bowel habits noted, no rectal bleeding. Prilosec BID. Last EGD in remote past per pt, at least 20 years ago. Symptoms resulted in ED visit Jan 2014, with CT showing thickening of the distal and terminal ileum. She does note a recent colonoscopy, and I have requested these reports.  However, due to new-onset dyspepsia, we will proceed with an EGD in the near future. Doubt dealing with mesenteric ischemia, although she does have significant hx of cardiovascular disease. It is reassuring that her colonoscopy is up-to-date. I would like to see if biopsies from TI were obtained from colonoscopy.   Proceed with upper endoscopy in the near future with Dr. Jena Gauss. The risks, benefits, and alternatives have been discussed in detail with patient. They have stated understanding and desire to proceed.    ADDENDUM 2/19: Received colonoscopy from August 2013 by Dr. Loreta Ave, [facility]: small internal hemorrhoids and few scattered sigmoid diverticula, otherwise normal up to TI, TI appeared normal. Does not appear any biopsies performed. If concern regarding CT findings, consider colonoscopy with biopsy.

## 2012-05-24 NOTE — Patient Instructions (Addendum)
We have scheduled you for an upper endoscopy with Dr. Jena Gauss in the near future.  Continue to take Prilosec twice a day, 30 minutes before meals.  Further recommendations in the near future.

## 2012-05-24 NOTE — Progress Notes (Signed)
Primary Care Physician:  [name], MD Primary Gastroenterologist:  Dr.   Antony Contras Complaint  Patient presents with  . EGD    HPI:   [name] is a 72 year old female who presents today at the request of Dr. [name]. She presented to the [facility] Emergency Department January 2014 with abdominal pain, nausea, and loose stool. CT abd/pelvis showed thickening of the distal and terminal ileum with small amount of adjacent inflammatory changes and free fluid, suggesting  ileitis. Numerous colonic diverticula noted, no diverticulitis.   Notes prior to ED visit, she had worsening abdominal pain, described as epigastric pain worsened with eating and drinking. +nausea. Radiates to lower abdomen and up into chest. Symptoms present since January. No vomiting. No diarrhea, constipation, no changes in bowel habits. No blood in stool. No wt loss or lack of appetite. Not eating as much, as she is trying to stick with liquids, "light things". No NSAIDs or aspirin powders. Taking Prilosec BID.   Pt thinks she had an EGD about 20-30 years ago. Last colonoscopy in October with Dr. Loreta Ave. Pt states this was normal. I do not have these records at the time of the visit, but they have been requested.    Past Medical History  Diagnosis Date  . Hypertension   . ASCVD (arteriosclerotic cardiovascular disease)     60% left anterior descending lesion in 2002; stress nuclear in 05/2009-abnormal septal motion  borderline left ventricular size; breast attenuation artifact without evidence for ischemia;  Echocardiogram-EF of 40-45% with paradoxic septal motion;  . Nephrolithiasis   . Hyperlipidemia     CONTROLLED  . Left bundle branch block   . Hypothyroidism   . Obesity   . Anemia      H/H of 10.3/29.6 with a normal MCV in 07/2007 following surgery  . Chronic insomnia   . DJD (degenerative joint disease)     s/p left THA; requires reoperation due to fracture of prosthesis  . GERD (gastroesophageal reflux  disease)     Past Surgical History  Procedure Laterality Date  . Revision total hip arthroplasty  2009    Left THA 2009  . Appendectomy    . Abdominal hysterectomy       (partial)  . Nose surgery    . Hemorroidectomy    . Colon polyps      remote past  . Cholecystectomy      Current Outpatient Prescriptions  Medication Sig Dispense Refill  . amLODipine (NORVASC) 10 MG tablet Take 10 mg by mouth every morning.       Marland Kitchen aspirin 81 MG tablet Take 81 mg by mouth every morning.       . Calcium Carbonate-Vitamin D (CALCIUM 600 + D PO) Take 1 tablet by mouth 2 (two) times daily.      . Cyanocobalamin (VITAMIN B12 PO) Take 2,500 mg by mouth daily.      . fish oil-omega-3 fatty acids 1000 MG capsule Take 1 g by mouth every morning.       . furosemide (LASIX) 40 MG tablet Take 20-40 mg by mouth daily. Alternate taking one tablet (40mg  total) and one & one-half tablet (20mg  total) daily      . gabapentin (NEURONTIN) 100 MG capsule Take 100 mg by mouth at bedtime.      Marland Kitchen HYDROcodone-acetaminophen (NORCO) 10-325 MG per tablet Take 1 tablet by mouth every 6 (six) hours as needed.       Marland Kitchen lisinopril (PRINIVIL,ZESTRIL) 20 MG tablet Take 20  mg by mouth 2 (two) times daily. Take 1 tab twice a day      . nitroGLYCERIN (NITROSTAT) 0.4 MG SL tablet Place 0.4 mg under the tongue every 5 (five) minutes as needed. For chest pain       . omeprazole (PRILOSEC) 20 MG capsule Take 20 mg by mouth 2 (two) times daily.       . pravastatin (PRAVACHOL) 40 MG tablet Take 40 mg by mouth at bedtime.        No current facility-administered medications for this visit.    Allergies as of 05/24/2012 - Review Complete 05/24/2012  Allergen Reaction Noted  . Codeine  02/19/2009    Family History  Problem Relation Age of Onset  . Colon cancer Neg Hx     History   Social History  . Marital Status: Married    Spouse Name: N/A    Number of Children: N/A  . Years of Education: N/A   Occupational History  .  retired     used to work at [employer] in [place]  .     Social History Main Topics  . Smoking status: Never Smoker   . Smokeless tobacco: Not on file  . Alcohol Use: No  . Drug Use: No  . Sexually Active: Not on file   Other Topics Concern  . Not on file   Social History Narrative  . No narrative on file    Review of Systems: Gen: SEE HPI CV: Denies chest pain, heart palpitations, peripheral edema, syncope.  Resp: +DOE GI: SEE HPI GU : Denies urinary burning, urinary frequency, urinary hesitancy MS: + joint pain, right heel spurs Derm: Denies rash, itching, dry skin Psych: Denies depression, anxiety, memory loss, and confusion Heme: Denies bruising, bleeding, and enlarged lymph nodes.  Physical Exam: BP 119/72  Pulse 76  Temp(Src) 97.4 F (36.3 C) (Oral)  Ht 5\' 2"  (1.575 m)  Wt 225 lb (102.059 kg)  BMI 41.14 kg/m2 General:   Alert and oriented. Pleasant and cooperative. Well-nourished and well-developed.  Head:  Normocephalic and atraumatic. Eyes:  Without icterus, sclera clear and conjunctiva pink.  Ears:  Normal auditory acuity. Nose:  No deformity, discharge,  or lesions. Mouth:  No deformity or lesions, oral mucosa pink.  Neck:  Supple, without mass or thyromegaly. Lungs:  Clear to auscultation bilaterally. No wheezes, rales, or rhonchi. No distress.  Heart:  S1, S2 present without murmurs appreciated.  Abdomen:  +BS, soft, TTP epigastric region and non-distended. No HSM noted. No guarding or rebound. No masses appreciated.  Rectal:  Deferred  Msk:  Symmetrical without gross deformities. Normal posture. Extremities:  Non-pitting edema bilateral lower extremities Neurologic:  Alert and  oriented x4;  grossly normal neurologically. Skin:  Intact without significant lesions or rashes. Cervical Nodes:  No significant cervical adenopathy. Psych:  Alert and cooperative. Normal mood and affect.

## 2012-05-31 ENCOUNTER — Encounter (HOSPITAL_COMMUNITY): Payer: Self-pay | Admitting: Pharmacy Technician

## 2012-06-01 ENCOUNTER — Ambulatory Visit (HOSPITAL_COMMUNITY)
Admission: RE | Admit: 2012-06-01 | Discharge: 2012-06-01 | Disposition: A | Discharge status: Home Health | Payer: Medicare Other | Source: Ambulatory Visit | Attending: Internal Medicine | Admitting: Internal Medicine

## 2012-06-01 ENCOUNTER — Encounter (HOSPITAL_COMMUNITY)
Admission: RE | Disposition: A | Discharge status: Home Health | Payer: Self-pay | Source: Ambulatory Visit | Attending: Internal Medicine

## 2012-06-01 ENCOUNTER — Encounter (HOSPITAL_COMMUNITY): Payer: Self-pay | Admitting: Gastroenterology

## 2012-06-01 DIAGNOSIS — R1013 Epigastric pain: Secondary | ICD-10-CM

## 2012-06-01 DIAGNOSIS — I10 Essential (primary) hypertension: Secondary | ICD-10-CM | POA: Insufficient documentation

## 2012-06-01 DIAGNOSIS — K449 Diaphragmatic hernia without obstruction or gangrene: Secondary | ICD-10-CM

## 2012-06-01 HISTORY — PX: ESOPHAGOGASTRODUODENOSCOPY: SHX5428

## 2012-06-01 LAB — HEPATIC FUNCTION PANEL
ALT: 13 U/L (ref 0–35)
AST: 19 U/L (ref 0–37)
Albumin: 3.6 g/dL (ref 3.5–5.2)
Indirect Bilirubin: 0.2 mg/dL — ABNORMAL LOW (ref 0.3–0.9)
Total Protein: 6.6 g/dL (ref 6.0–8.3)

## 2012-06-01 SURGERY — EGD (ESOPHAGOGASTRODUODENOSCOPY)
Anesthesia: Moderate Sedation

## 2012-06-01 MED ORDER — MIDAZOLAM HCL 5 MG/5ML IJ SOLN
INTRAMUSCULAR | Status: AC
  Filled 2012-06-01: qty 5

## 2012-06-01 MED ORDER — STERILE WATER FOR IRRIGATION IR SOLN
Status: DC | PRN
  Administered 2012-06-01: 11:00:00

## 2012-06-01 MED ORDER — ONDANSETRON HCL 4 MG/2ML IJ SOLN
INTRAMUSCULAR | Status: DC | PRN
  Administered 2012-06-01: 4 mg via INTRAVENOUS

## 2012-06-01 MED ORDER — MIDAZOLAM HCL 5 MG/5ML IJ SOLN
INTRAMUSCULAR | Status: DC | PRN
  Administered 2012-06-01 (×2): 2 mg via INTRAVENOUS

## 2012-06-01 MED ORDER — MEPERIDINE HCL 100 MG/ML IJ SOLN
INTRAMUSCULAR | Status: DC | PRN
  Administered 2012-06-01: 25 mg via INTRAVENOUS
  Administered 2012-06-01: 50 mg via INTRAVENOUS

## 2012-06-01 MED ORDER — MEPERIDINE HCL 100 MG/ML IJ SOLN
INTRAMUSCULAR | Status: AC
  Filled 2012-06-01: qty 1

## 2012-06-01 MED ORDER — ONDANSETRON HCL 4 MG/2ML IJ SOLN
INTRAMUSCULAR | Status: AC
  Filled 2012-06-01: qty 2

## 2012-06-01 MED ORDER — BUTAMBEN-TETRACAINE-BENZOCAINE 2-2-14 % EX AERO
INHALATION_SPRAY | CUTANEOUS | Status: DC | PRN
  Administered 2012-06-01: 2 via TOPICAL

## 2012-06-01 MED ORDER — SODIUM CHLORIDE 0.45 % IV SOLN
INTRAVENOUS | Status: DC
  Administered 2012-06-01: 10:00:00 via INTRAVENOUS

## 2012-06-01 NOTE — Interval H&P Note (Signed)
History and Physical Interval Note:  06/01/2012 11:06 AM  [name]  has presented today for surgery, with the diagnosis of Dyspepsia  The various methods of treatment have been discussed with the patient and family. After consideration of risks, benefits and other options for treatment, the patient has consented to  Procedure(s) with comments: ESOPHAGOGASTRODUODENOSCOPY (EGD) (N/A) - at 11:30 as a surgical intervention .  The patient's history has been reviewed, patient examined, no change in status, stable for surgery.  I have reviewed the patient's chart and labs.  Questions were answered to the patient's satisfaction.     [name] [name]  EGD per plan. Patient has  persisting postprandial epigastric pain.  No dysphagia.The risks, benefits, limitations, alternatives and imponderables have been reviewed with the patient. Potential for esophageal dilation, biopsy, etc. have also been reviewed.  Questions have been answered. All parties agreeable.

## 2012-06-01 NOTE — Op Note (Signed)
[facility] [address] [place], [address]   ENDOSCOPY PROCEDURE REPORT  PATIENT: Jillian, Evans  MR#: [id number] BIRTHDATE: 1940-06-26 , 71  yrs. old GENDER: Female ENDOSCOPIST: R.  Roetta Sessions, MD FACP [facility] REFERRED BY:  [name], M.D. PROCEDURE DATE:  06/01/2012 PROCEDURE:     Diagnostic EGD  INDICATIONS:     Postprandial epigastric pain; gallbladderout in 1981  INFORMED CONSENT:   The risks, benefits, limitations, alternatives and imponderables have been discussed.  The potential for biopsy, esophogeal dilation, etc. have also been reviewed.  Questions have been answered.  All parties agreeable.  Please see the history and physical in the medical record for more information.  MEDICATIONS:  Versed and Demerol in incremental doses. Zofran 4 mg IV. Cetacaine spray  DESCRIPTION OF PROCEDURE:   The Pentax Gastroscope [id number] endoscope was introduced through the mouth and advanced to the second portion of the duodenum without difficulty or limitations. The mucosal surfaces were surveyed very carefully during advancement of the scope and upon withdrawal.  Retroflexion view of the proximal stomach and esophagogastric junction was performed.      FINDINGS:  Tubular esophagus slightly "corkscrewed". However, lumen widely patent and mucosa appeared normal. GE junction easily traversed. Stomach empty. 5 cm hiatal hernia. Gastric mucosa appeared normal. Patent pylorus. Normal first and second portion of the duodenum  THERAPEUTIC / DIAGNOSTIC MANEUVERS PERFORMED:  None   COMPLICATIONS:  None  IMPRESSION:  5 cm hiatal hernia. Essentially normal esophagus. No explanation for her postprandial epigastric pain. Patient has no odynophagia or dysphagia. I wonder if she has a small common duct stone producing low-grade biliary colic. Bile duct not dilated on recent CT scan.  RECOMMENDATIONS: Continue omeprazole. Add Carafate suspension 1 g 4 times a  day. Check hepatic profile and recheck serum lipase; further recommendations to follow.    _______________________________ R. Roetta Sessions, MD FACP [facility] eSigned:  R. Roetta Sessions, MD FACP [facility] 06/01/2012 11:40 AM     CC:  PATIENT NAME:  Jillian, Evans MR#: [id number]

## 2012-06-01 NOTE — H&P (View-Only) (Signed)
 Primary Care Physician:  HILL,GERALD K, MD Primary Gastroenterologist:  Dr.   Chief Complaint  Patient presents with  . EGD    HPI:   Ms. [name] is a 72-year-old female who presents today at the request of Dr. Hill. She presented to the [place] Emergency Department January 2014 with abdominal pain, nausea, and loose stool. CT abd/pelvis showed thickening of the distal and terminal ileum with small amount of adjacent inflammatory changes and free fluid, suggesting  ileitis. Numerous colonic diverticula noted, no diverticulitis.   Notes prior to ED visit, she had worsening abdominal pain, described as epigastric pain worsened with eating and drinking. +nausea. Radiates to lower abdomen and up into chest. Symptoms present since January. No vomiting. No diarrhea, constipation, no changes in bowel habits. No blood in stool. No wt loss or lack of appetite. Not eating as much, as she is trying to stick with liquids, "light things". No NSAIDs or aspirin powders. Taking Prilosec BID.   Pt thinks she had an EGD about 20-30 years ago. Last colonoscopy in October with Dr. Mann. Pt states this was normal. I do not have these records at the time of the visit, but they have been requested.    Past Medical History  Diagnosis Date  . Hypertension   . ASCVD (arteriosclerotic cardiovascular disease)     60% left anterior descending lesion in 2002; stress nuclear in 05/2009-abnormal septal motion  borderline left ventricular size; breast attenuation artifact without evidence for ischemia;  Echocardiogram-EF of 40-45% with paradoxic septal motion;  . Nephrolithiasis   . Hyperlipidemia     CONTROLLED  . Left bundle branch block   . Hypothyroidism   . Obesity   . Anemia      H/H of 10.3/29.6 with a normal MCV in 07/2007 following surgery  . Chronic insomnia   . DJD (degenerative joint disease)     s/p left THA; requires reoperation due to fracture of prosthesis  . GERD (gastroesophageal reflux  disease)     Past Surgical History  Procedure Laterality Date  . Revision total hip arthroplasty  2009    Left THA 2009  . Appendectomy    . Abdominal hysterectomy       (partial)  . Nose surgery    . Hemorroidectomy    . Colon polyps      remote past  . Cholecystectomy      Current Outpatient Prescriptions  Medication Sig Dispense Refill  . amLODipine (NORVASC) 10 MG tablet Take 10 mg by mouth every morning.       . aspirin 81 MG tablet Take 81 mg by mouth every morning.       . Calcium Carbonate-Vitamin D (CALCIUM 600 + D PO) Take 1 tablet by mouth 2 (two) times daily.      . Cyanocobalamin (VITAMIN B12 PO) Take 2,500 mg by mouth daily.      . fish oil-omega-3 fatty acids 1000 MG capsule Take 1 g by mouth every morning.       . furosemide (LASIX) 40 MG tablet Take 20-40 mg by mouth daily. Alternate taking one tablet (40mg total) and one & one-half tablet (20mg total) daily      . gabapentin (NEURONTIN) 100 MG capsule Take 100 mg by mouth at bedtime.      . HYDROcodone-acetaminophen (NORCO) 10-325 MG per tablet Take 1 tablet by mouth every 6 (six) hours as needed.       . lisinopril (PRINIVIL,ZESTRIL) 20 MG tablet Take 20   mg by mouth 2 (two) times daily. Take 1 tab twice a day      . nitroGLYCERIN (NITROSTAT) 0.4 MG SL tablet Place 0.4 mg under the tongue every 5 (five) minutes as needed. For chest pain       . omeprazole (PRILOSEC) 20 MG capsule Take 20 mg by mouth 2 (two) times daily.       . pravastatin (PRAVACHOL) 40 MG tablet Take 40 mg by mouth at bedtime.        No current facility-administered medications for this visit.    Allergies as of 05/24/2012 - Review Complete 05/24/2012  Allergen Reaction Noted  . Codeine  02/19/2009    Family History  Problem Relation Age of Onset  . Colon cancer Neg Hx     History   Social History  . Marital Status: Married    Spouse Name: N/A    Number of Children: N/A  . Years of Education: N/A   Occupational History  .  retired     used to work at [employer] in [place]  .     Social History Main Topics  . Smoking status: Never Smoker   . Smokeless tobacco: Not on file  . Alcohol Use: No  . Drug Use: No  . Sexually Active: Not on file   Other Topics Concern  . Not on file   Social History Narrative  . No narrative on file    Review of Systems: Gen: SEE HPI CV: Denies chest pain, heart palpitations, peripheral edema, syncope.  Resp: +DOE GI: SEE HPI GU : Denies urinary burning, urinary frequency, urinary hesitancy MS: + joint pain, right heel spurs Derm: Denies rash, itching, dry skin Psych: Denies depression, anxiety, memory loss, and confusion Heme: Denies bruising, bleeding, and enlarged lymph nodes.  Physical Exam: BP 119/72  Pulse 76  Temp(Src) 97.4 F (36.3 C) (Oral)  Ht 5' 2" (1.575 m)  Wt 225 lb (102.059 kg)  BMI 41.14 kg/m2 General:   Alert and oriented. Pleasant and cooperative. Well-nourished and well-developed.  Head:  Normocephalic and atraumatic. Eyes:  Without icterus, sclera clear and conjunctiva pink.  Ears:  Normal auditory acuity. Nose:  No deformity, discharge,  or lesions. Mouth:  No deformity or lesions, oral mucosa pink.  Neck:  Supple, without mass or thyromegaly. Lungs:  Clear to auscultation bilaterally. No wheezes, rales, or rhonchi. No distress.  Heart:  S1, S2 present without murmurs appreciated.  Abdomen:  +BS, soft, TTP epigastric region and non-distended. No HSM noted. No guarding or rebound. No masses appreciated.  Rectal:  Deferred  Msk:  Symmetrical without gross deformities. Normal posture. Extremities:  Non-pitting edema bilateral lower extremities Neurologic:  Alert and  oriented x4;  grossly normal neurologically. Skin:  Intact without significant lesions or rashes. Cervical Nodes:  No significant cervical adenopathy. Psych:  Alert and cooperative. Normal mood and affect.    

## 2012-06-06 ENCOUNTER — Encounter (HOSPITAL_COMMUNITY): Payer: Self-pay | Admitting: Internal Medicine

## 2012-06-13 ENCOUNTER — Encounter: Payer: Self-pay | Admitting: Internal Medicine

## 2012-06-24 ENCOUNTER — Encounter: Payer: Self-pay | Admitting: Internal Medicine

## 2012-07-07 ENCOUNTER — Encounter: Payer: Self-pay | Admitting: Internal Medicine

## 2012-07-11 ENCOUNTER — Encounter: Payer: Self-pay | Admitting: Gastroenterology

## 2012-07-11 ENCOUNTER — Ambulatory Visit: Payer: Medicare Other | Admitting: Gastroenterology

## 2012-07-11 ENCOUNTER — Ambulatory Visit (INDEPENDENT_AMBULATORY_CARE_PROVIDER_SITE_OTHER): Payer: Medicare Other | Admitting: Gastroenterology

## 2012-07-11 VITALS — BP 108/58 | HR 80 | Temp 98.4°F | Ht 62.0 in | Wt 227.2 lb

## 2012-07-11 DIAGNOSIS — R1013 Epigastric pain: Secondary | ICD-10-CM

## 2012-07-11 DIAGNOSIS — K3189 Other diseases of stomach and duodenum: Secondary | ICD-10-CM

## 2012-07-11 MED ORDER — PANTOPRAZOLE SODIUM 40 MG PO TBEC: 40.0000 mg | DELAYED_RELEASE_TABLET | Freq: Two times a day (BID) | ORAL | Status: DC

## 2012-07-11 NOTE — Progress Notes (Signed)
CC PCP 

## 2012-07-11 NOTE — Assessment & Plan Note (Signed)
72 year old female with constant epigastric pain, awakens at night, decreased po intake and desire to eat due to discomfort. EGD on file from Feb 2014 unrevealing. Lipase and HFP are normal. Gallbladder removed in remote past. CT in Jan 2014 with questionable ileitis. Colonoscopy last year reviewed with normal TI. No changes in bowel habits noted. Unclear etiology of abdominal pain. Recommend switching from Prilosec to Protonix 40 mg po BID. She is to call in 1 week. If no improvement, likely proceed with CT angiogram to assess for any evidence of mesenteric ischemia.

## 2012-07-11 NOTE — Progress Notes (Signed)
Referring Provider: [name], MD Primary Care Physician:  [name], MD Primary Gastroenterologist: Dr. Jena Gauss   Chief Complaint  Patient presents with  . Follow-up  . Nausea    HPI:   [name] is a pleasant 72 year old female who presents today in follow-up from EGD secondary to dyspepsia. EGD Feb 2014 by RMR:5 cm hiatal hernia. Essentially normal esophagus. Cholecystectomy in 1980s. Lipase and HFP drawn to assess for any abnormalities. Both were normal. States epigastric pain is constant, wakes her up at night. Sometimes doesn't want to eat at all. Sometimes hurts with eating. Vomited one time. Takes Omeprazole 2 pills once per day. Up 2 lbs from last visit. Says sometimes she is constipated, sometimes loose stools. Sometimes incontinence. Prilosec is free,  just got refill for 3 months.    Past Medical History  Diagnosis Date  . Hypertension   . ASCVD (arteriosclerotic cardiovascular disease)     60% left anterior descending lesion in 2002; stress nuclear in 05/2009-abnormal septal motion  borderline left ventricular size; breast attenuation artifact without evidence for ischemia;  Echocardiogram-EF of 40-45% with paradoxic septal motion;  . Nephrolithiasis   . Hyperlipidemia     CONTROLLED  . Left bundle branch block   . Hypothyroidism   . Obesity   . Anemia      H/H of 10.3/29.6 with a normal MCV in 07/2007 following surgery  . Chronic insomnia   . DJD (degenerative joint disease)     s/p left THA; requires reoperation due to fracture of prosthesis  . GERD (gastroesophageal reflux disease)     Past Surgical History  Procedure Laterality Date  . Revision total hip arthroplasty  2009    Left THA 2009  . Appendectomy    . Abdominal hysterectomy       (partial)  . Nose surgery    . Hemorroidectomy    . Colon polyps      remote past  . Cholecystectomy    . Colonoscopy  Aug 2013    Dr. Loreta Ave, [facility]: small internal hemorrhoids and few scattered  sigmoid diverticula, otherwise normal up to TI, TI appeared normal  . Esophagogastroduodenoscopy N/A 06/01/2012    RMR:5 cm hiatal hernia. Essentially normal esophagus    Current Outpatient Prescriptions  Medication Sig Dispense Refill  . amLODipine (NORVASC) 10 MG tablet Take 10 mg by mouth every morning.       Marland Kitchen aspirin 81 MG tablet Take 81 mg by mouth every morning.       . Calcium Carbonate-Vitamin D (CALCIUM 600 + D PO) Take 1 tablet by mouth 2 (two) times daily.      Marland Kitchen CARAFATE 1 GM/10ML suspension Take 500 mg by mouth 2 (two) times daily.       . Cyanocobalamin (VITAMIN B12 PO) Take 2,500 mg by mouth daily.      . fish oil-omega-3 fatty acids 1000 MG capsule Take 1 g by mouth every morning.       . furosemide (LASIX) 40 MG tablet Take 20-40 mg by mouth daily. Alternate taking one tablet (40mg  total) and one & one-half tablet (20mg  total) daily      . gabapentin (NEURONTIN) 100 MG capsule Take 100 mg by mouth at bedtime.      Marland Kitchen HYDROcodone-acetaminophen (NORCO) 10-325 MG per tablet Take 1 tablet by mouth every 6 (six) hours as needed.       Marland Kitchen lisinopril (PRINIVIL,ZESTRIL) 20 MG tablet Take 20 mg by mouth 2 (two) times daily.  Take 1 tab twice a day      . nitroGLYCERIN (NITROSTAT) 0.4 MG SL tablet Place 0.4 mg under the tongue every 5 (five) minutes as needed. For chest pain       . omeprazole (PRILOSEC) 20 MG capsule Take 20 mg by mouth 2 (two) times daily.       . pravastatin (PRAVACHOL) 40 MG tablet Take 40 mg by mouth at bedtime.        No current facility-administered medications for this visit.    Allergies as of 07/11/2012 - Review Complete 07/11/2012  Allergen Reaction Noted  . Codeine Itching 02/19/2009    Family History  Problem Relation Age of Onset  . Colon cancer Neg Hx     History   Social History  . Marital Status: Widowed    Spouse Name: N/A    Number of Children: N/A  . Years of Education: N/A   Occupational History  . retired     used to work at [employer] in [place]  .     Social History Main Topics  . Smoking status: Never Smoker   . Smokeless tobacco: None  . Alcohol Use: No  . Drug Use: No  . Sexually Active: None   Other Topics Concern  . None   Social History Narrative  . None    Review of Systems: Negative unless mentioned in HPI.   Physical Exam: BP 108/58  Pulse 80  Temp(Src) 98.4 F (36.9 C) (Oral)  Ht 5\' 2"  (1.575 m)  Wt 227 lb 3.2 oz (103.057 kg)  BMI 41.54 kg/m2 General:   Alert and oriented. No distress noted. Pleasant and cooperative.  Head:  Normocephalic and atraumatic. Eyes:  Conjuctiva clear without scleral icterus. Mouth:  Oral mucosa pink and moist. Good dentition. No lesions. Heart:  S1, S2 present without murmurs, rubs, or gallops. Regular rate and rhythm. Abdomen:  +BS, soft, non-tender and non-distended. No rebound or guarding. No HSM or masses noted. Msk:  Symmetrical without gross deformities. Normal posture. Extremities:  Without edema. Neurologic:  Alert and  oriented x4;  grossly normal neurologically. Skin:  Intact without significant lesions or rashes. Psych:  Alert and cooperative. Normal mood and affect.

## 2012-07-11 NOTE — Patient Instructions (Addendum)
Stop Prilosec.  Start taking Protonix one before breakfast and one before dinner. Please call us with an update on how you are doing after you have been taking about a week.  If no improvement, we will need to do further tests.

## 2012-07-18 ENCOUNTER — Ambulatory Visit: Payer: Medicare Other | Admitting: Gastroenterology

## 2012-09-23 ENCOUNTER — Other Ambulatory Visit (HOSPITAL_COMMUNITY): Payer: Self-pay | Admitting: Orthopedic Surgery

## 2012-09-23 DIAGNOSIS — M25552 Pain in left hip: Secondary | ICD-10-CM

## 2012-09-30 ENCOUNTER — Ambulatory Visit (HOSPITAL_COMMUNITY)
Admission: RE | Admit: 2012-09-30 | Discharge: 2012-09-30 | Disposition: A | Discharge status: Home / Self Care | Payer: Medicare Other | Source: Ambulatory Visit | Attending: Orthopedic Surgery | Admitting: Orthopedic Surgery

## 2012-09-30 ENCOUNTER — Ambulatory Visit (INDEPENDENT_AMBULATORY_CARE_PROVIDER_SITE_OTHER): Payer: Medicare Other | Admitting: Cardiology

## 2012-09-30 ENCOUNTER — Encounter: Payer: Self-pay | Admitting: Cardiology

## 2012-09-30 VITALS — BP 122/73 | HR 73 | Wt 225.4 lb

## 2012-09-30 DIAGNOSIS — N181 Chronic kidney disease, stage 1: Secondary | ICD-10-CM

## 2012-09-30 DIAGNOSIS — E785 Hyperlipidemia, unspecified: Secondary | ICD-10-CM

## 2012-09-30 DIAGNOSIS — I7090 Unspecified atherosclerosis: Secondary | ICD-10-CM

## 2012-09-30 DIAGNOSIS — D649 Anemia, unspecified: Secondary | ICD-10-CM

## 2012-09-30 DIAGNOSIS — M25552 Pain in left hip: Secondary | ICD-10-CM

## 2012-09-30 DIAGNOSIS — M76899 Other specified enthesopathies of unspecified lower limb, excluding foot: Secondary | ICD-10-CM | POA: Insufficient documentation

## 2012-09-30 DIAGNOSIS — M79609 Pain in unspecified limb: Secondary | ICD-10-CM | POA: Insufficient documentation

## 2012-09-30 DIAGNOSIS — G4700 Insomnia, unspecified: Secondary | ICD-10-CM

## 2012-09-30 DIAGNOSIS — I2510 Atherosclerotic heart disease of native coronary artery without angina pectoris: Secondary | ICD-10-CM

## 2012-09-30 DIAGNOSIS — K5730 Diverticulosis of large intestine without perforation or abscess without bleeding: Secondary | ICD-10-CM | POA: Insufficient documentation

## 2012-09-30 DIAGNOSIS — N200 Calculus of kidney: Secondary | ICD-10-CM

## 2012-09-30 DIAGNOSIS — I10 Essential (primary) hypertension: Secondary | ICD-10-CM

## 2012-09-30 DIAGNOSIS — R079 Chest pain, unspecified: Secondary | ICD-10-CM

## 2012-09-30 NOTE — Patient Instructions (Addendum)
Your physician recommends that you schedule a follow-up appointment in: ONE YEAR 

## 2012-09-30 NOTE — Assessment & Plan Note (Signed)
Blood pressure control has been excellent; current medications will be continued.

## 2012-09-30 NOTE — Progress Notes (Deleted)
Name: [name]    DOB: Feb 19, 1941  Age: 72 y.o.  MR#: [id number]       PCP:  [name], MD      Insurance: Payor: [employer] [place] MEDICARE / Plan: BLUE MEDICARE / Product Type: *No Product type* /   CC:   No chief complaint on file.  LIST VS Filed Vitals:   09/30/12 0912  BP: 122/73  Pulse: 73  Weight: 225 lb 6.4 oz (102.241 kg)    Weights Current Weight  09/30/12 225 lb 6.4 oz (102.241 kg)  07/11/12 227 lb 3.2 oz (103.057 kg)  05/24/12 225 lb (102.059 kg)    Blood Pressure  BP Readings from Last 3 Encounters:  09/30/12 122/73  07/11/12 108/58  06/01/12 111/53     Admit date:  (Not on file) Last encounter with RMR:  Visit date not found   Allergy Codeine  Current Outpatient Prescriptions  Medication Sig Dispense Refill  . amLODipine (NORVASC) 10 MG tablet Take 10 mg by mouth every morning.       Marland Kitchen aspirin 81 MG tablet Take 81 mg by mouth every morning.       . Calcium Carbonate-Vitamin D (CALCIUM 600 + D PO) Take 1 tablet by mouth 2 (two) times daily.      Marland Kitchen CARAFATE 1 GM/10ML suspension Take 500 mg by mouth 2 (two) times daily.       . Cyanocobalamin (VITAMIN B12 PO) Take 2,500 mg by mouth daily.      . furosemide (LASIX) 40 MG tablet Take 20-40 mg by mouth daily. Alternate taking one tablet (40mg  total) and one & one-half tablet (20mg  total) daily      . gabapentin (NEURONTIN) 100 MG capsule Take 100 mg by mouth at bedtime.      Marland Kitchen HYDROcodone-acetaminophen (NORCO) 10-325 MG per tablet Take 1 tablet by mouth every 6 (six) hours as needed.       Marland Kitchen lisinopril (PRINIVIL,ZESTRIL) 20 MG tablet Take 20 mg by mouth 2 (two) times daily. Take 1 tab twice a day      . nitroGLYCERIN (NITROSTAT) 0.4 MG SL tablet Place 0.4 mg under the tongue every 5 (five) minutes as needed. For chest pain       . omeprazole (PRILOSEC) 20 MG capsule Take 20 mg by mouth 2 (two) times daily.       . pravastatin (PRAVACHOL) 40 MG tablet Take 40 mg by mouth at bedtime.         No current facility-administered medications for this visit.    Discontinued Meds:    Medications Discontinued During This Encounter  Medication Reason  . fish oil-omega-3 fatty acids 1000 MG capsule Error  . pantoprazole (PROTONIX) 40 MG tablet Error    Patient Active Problem List   Diagnosis Date Noted  . Dyspepsia 05/24/2012  . ASCVD (arteriosclerotic cardiovascular disease)   . DJD (degenerative joint disease)   . Left bundle branch block 08/01/2010  . CHEST PAIN-UNSPECIFIED 04/22/2009  . HYPOTHYROIDISM 12/05/2008  . HYPERLIPIDEMIA, CONTROLLED 12/05/2008  . ANEMIA 12/05/2008  . INSOMNIA, CHRONIC 12/05/2008  . HYPERTENSION 12/05/2008  . CHRONIC KIDNEY DISEASE STAGE I 12/05/2008  . NEPHROLITHIASIS 12/05/2008  . Chronic kidney disease, stage 1, normal or increased GFR 06/12/2007    LABS    Component Value Date/Time   NA 136 04/22/2012 1850   NA 141 04/09/2011 1410   NA 140 12/26/2009 1417   K 4.3 04/22/2012 1850   K 4.2 04/09/2011  1410   K 4.5 12/26/2009 1417   CL 99 04/22/2012 1850   CL 103 04/09/2011 1410   CL 105 12/26/2009 1417   CO2 25 04/22/2012 1850   CO2 27 04/09/2011 1410   CO2 26 12/26/2009 1417   GLUCOSE 87 04/22/2012 1850   GLUCOSE 89 04/09/2011 1410   GLUCOSE 93 12/26/2009 1417   BUN 17 04/22/2012 1850   BUN 17 04/09/2011 1410   BUN 15 12/26/2009 1417   CREATININE 0.89 04/22/2012 1850   CREATININE 1.09 04/09/2011 1410   CREATININE 0.79 12/26/2009 1417   CALCIUM 10.1 04/22/2012 1850   CALCIUM 9.6 04/09/2011 1410   CALCIUM 10.2 12/26/2009 1417   GFRNONAA 65* 04/22/2012 1850   GFRNONAA 51* 04/09/2011 1410   GFRNONAA 55* 08/02/2007 1414   GFRAA 75* 04/22/2012 1850   GFRAA 59* 04/09/2011 1410   GFRAA  Value: >60        The eGFR has been calculated using the MDRD equation. This calculation has not been validated in all clinical 08/02/2007 1414   CMP     Component Value Date/Time   NA 136 04/22/2012 1850   K 4.3 04/22/2012 1850   CL 99 04/22/2012 1850   CO2 25  04/22/2012 1850   GLUCOSE 87 04/22/2012 1850   BUN 17 04/22/2012 1850   CREATININE 0.89 04/22/2012 1850   CALCIUM 10.1 04/22/2012 1850   PROT 6.6 06/01/2012 1150   ALBUMIN 3.6 06/01/2012 1150   AST 19 06/01/2012 1150   ALT 13 06/01/2012 1150   ALKPHOS 96 06/01/2012 1150   BILITOT 0.3 06/01/2012 1150   GFRNONAA 65* 04/22/2012 1850   GFRAA 75* 04/22/2012 1850       Component Value Date/Time   WBC 10.2 04/22/2012 1850   WBC 3.9* 04/09/2011 1410   WBC 11.0* 08/11/2007 0650   HGB 13.7 04/22/2012 1850   HGB 12.9 04/09/2011 1410   HGB 10.3* 08/11/2007 0650   HCT 40.6 04/22/2012 1850   HCT 38.6 04/09/2011 1410   HCT 29.6* 08/11/2007 0650   MCV 92.7 04/22/2012 1850   MCV 91.7 04/09/2011 1410   MCV 91.3 08/11/2007 0650    Lipid Panel     Component Value Date/Time   CHOL 155 06/17/2009 2016   TRIG 97 06/17/2009 2016   HDL 62 06/17/2009 2016   CHOLHDL 2.5 Ratio 06/17/2009 2016   VLDL 19 06/17/2009 2016   LDLCALC 74 06/17/2009 2016    ABG No results found for this basename: phart, pco2, pco2art, po2, po2art, hco3, tco2, acidbasedef, o2sat     Lab Results  Component Value Date   TSH 2.903 12/26/2009   BNP (last 3 results) No results found for this basename: PROBNP,  in the last 8760 hours Cardiac Panel (last 3 results) No results found for this basename: CKTOTAL, CKMB, TROPONINI, RELINDX,  in the last 72 hours  Iron/TIBC/Ferritin No results found for this basename: iron, tibc, ferritin     EKG Orders placed in visit on 09/30/12  . EKG 12-LEAD     Prior Assessment and Plan Problem List as of 09/30/2012   HYPOTHYROIDISM   HYPERLIPIDEMIA, CONTROLLED   Last Assessment & Plan   09/18/2011 Office Visit Written 09/18/2011 12:30 PM by Jodelle Gross, NP     She is having labs completed in 2 days with adjustment in statin should this be necessary.    ANEMIA   INSOMNIA, CHRONIC   HYPERTENSION   Last Assessment & Plan   09/18/2011 Office Visit Written 09/18/2011 12:30  PM by Jodelle Gross, NP      Blood pressure is well controlled at present. No changes    Left bundle branch block   Last Assessment & Plan   09/15/2010 Office Visit Edited 09/15/2010  2:52 PM by Kathlen Brunswick, MD     Bundle branch block present since at least 2004; no progression of conduction system disease since     CHRONIC KIDNEY DISEASE STAGE I   NEPHROLITHIASIS   CHEST PAIN-UNSPECIFIED   ASCVD (arteriosclerotic cardiovascular disease)   Last Assessment & Plan   09/18/2011 Office Visit Written 09/18/2011 12:29 PM by Jodelle Gross, NP     She is without cardiac complaint. She is concerned about frequent cramping in her legs. I see that she is on lasix but no potassium replacement. She has CKD, and is due to have labs completed in Dr. Adaline Sill office in 2 days. I have advised potassium or magnesium tablets daily should this be warranted. No changes in medications otherwise.     Chronic kidney disease, stage 1, normal or increased GFR   DJD (degenerative joint disease)   Dyspepsia   Last Assessment & Plan   07/11/2012 Office Visit Written 07/11/2012  3:45 PM by Nira Retort, NP     72 year old female with constant epigastric pain, awakens at night, decreased po intake and desire to eat due to discomfort. EGD on file from Feb 2014 unrevealing. Lipase and HFP are normal. Gallbladder removed in remote past. CT in Jan 2014 with questionable ileitis. Colonoscopy last year reviewed with normal TI. No changes in bowel habits noted. Unclear etiology of abdominal pain. Recommend switching from Prilosec to Protonix 40 mg po BID. She is to call in 1 week. If no improvement, likely proceed with CT angiogram to assess for any evidence of mesenteric ischemia.         Imaging: No results found.

## 2012-09-30 NOTE — Assessment & Plan Note (Signed)
Control has been excellent in the past; recent lipid measurements will be sought.

## 2012-09-30 NOTE — Assessment & Plan Note (Signed)
No symptoms to suggest progression of disease or myocardial ischemia. We will continue to focus on optimal control of cardiovascular risk factors.

## 2012-09-30 NOTE — Assessment & Plan Note (Signed)
Colonoscopy last year reportedly negative; recent laboratory studies will be sought from Dr. [name].

## 2012-09-30 NOTE — Progress Notes (Signed)
Patient ID: [name], female   DOB: September 22, 1940, 72 y.o.   MRN: [id number]  HPI: Schedule return visit for this delightful woman with coronary artery disease that was of moderate severity and involved only the LAD when last assessed in 2002 for management of cardiovascular risk factors. She remains active including walking her dog and caring for relatives and neighbors to require assistance. She has been free of cardiopulmonary symptoms.  Laboratory studies have been followed by her PCP, Dr. [name]  Current Outpatient Prescriptions  Medication Sig Dispense Refill  . amLODipine (NORVASC) 10 MG tablet Take 10 mg by mouth every morning.       Marland Kitchen aspirin 81 MG tablet Take 81 mg by mouth every morning.       . Calcium Carbonate-Vitamin D (CALCIUM 600 + D PO) Take 1 tablet by mouth 2 (two) times daily.      Marland Kitchen CARAFATE 1 GM/10ML suspension Take 500 mg by mouth 2 (two) times daily.       . Cyanocobalamin (VITAMIN B12 PO) Take 2,500 mg by mouth daily.      . furosemide (LASIX) 40 MG tablet Take 40 mg by mouth every other day.       . gabapentin (NEURONTIN) 100 MG capsule Take 100 mg by mouth at bedtime.      Marland Kitchen HYDROcodone-acetaminophen (NORCO) 10-325 MG per tablet Take 1 tablet by mouth every 6 (six) hours as needed.       Marland Kitchen lisinopril (PRINIVIL,ZESTRIL) 20 MG tablet Take 20 mg by mouth 2 (two) times daily. Take 1 tab twice a day      . nitroGLYCERIN (NITROSTAT) 0.4 MG SL tablet Place 0.4 mg under the tongue every 5 (five) minutes as needed. For chest pain       . omeprazole (PRILOSEC) 20 MG capsule Take 20 mg by mouth 2 (two) times daily.       . pravastatin (PRAVACHOL) 40 MG tablet Take 40 mg by mouth at bedtime.        No current facility-administered medications for this visit.   Allergies  Allergen Reactions  . Codeine Itching    "feel like going buggy"  Itchy skin  Past medical history, social history, and family history reviewed and updated.  ROS: Denies chest pain, dyspnea, orthopnea,  PND, palpitations, lightheadedness or syncope. She describes increased pain in both hips for which she is currently under reevaluation by her orthopedic surgery, Dr. Turner Daniels.  All other systems reviewed and are negative.  PHYSICAL EXAM: BP 122/73  Pulse 73  Wt 102.241 kg (225 lb 6.4 oz)  BMI 41.22 kg/m2;  Body mass index is 41.22 kg/(m^2). General-Well developed; no acute distress Body habitus-moderately to markedly overweight and Neck-No JVD; no carotid bruits Lungs-clear lung fields; resonant to percussion Cardiovascular-normal PMI; normal S1 and S2; minimal basilar systolic ejection murmur Abdomen-normal bowel sounds; soft and non-tender without masses or organomegaly Musculoskeletal-No deformities, no cyanosis or clubbing Neurologic-Normal cranial nerves; symmetric strength and tone Skin-Warm, no significant lesions Extremities-distal pulses intact; trace edema  Jansen Bing, MD 09/30/2012  9:48 AM  ASSESSMENT AND PLAN

## 2012-10-06 ENCOUNTER — Encounter: Payer: Self-pay | Admitting: Cardiology

## 2012-10-12 ENCOUNTER — Emergency Department (HOSPITAL_COMMUNITY): Payer: Medicare Other

## 2012-10-12 ENCOUNTER — Emergency Department (HOSPITAL_COMMUNITY)
Admission: EM | Admit: 2012-10-12 | Discharge: 2012-10-12 | Disposition: A | Discharge status: Home / Self Care | Payer: Medicare Other | Attending: Emergency Medicine | Admitting: Emergency Medicine

## 2012-10-12 ENCOUNTER — Encounter (HOSPITAL_COMMUNITY): Payer: Self-pay | Admitting: *Deleted

## 2012-10-12 DIAGNOSIS — I2510 Atherosclerotic heart disease of native coronary artery without angina pectoris: Secondary | ICD-10-CM | POA: Insufficient documentation

## 2012-10-12 DIAGNOSIS — E785 Hyperlipidemia, unspecified: Secondary | ICD-10-CM | POA: Insufficient documentation

## 2012-10-12 DIAGNOSIS — E039 Hypothyroidism, unspecified: Secondary | ICD-10-CM | POA: Insufficient documentation

## 2012-10-12 DIAGNOSIS — Y929 Unspecified place or not applicable: Secondary | ICD-10-CM | POA: Insufficient documentation

## 2012-10-12 DIAGNOSIS — I10 Essential (primary) hypertension: Secondary | ICD-10-CM | POA: Insufficient documentation

## 2012-10-12 DIAGNOSIS — Z79899 Other long term (current) drug therapy: Secondary | ICD-10-CM | POA: Insufficient documentation

## 2012-10-12 DIAGNOSIS — M549 Dorsalgia, unspecified: Secondary | ICD-10-CM

## 2012-10-12 DIAGNOSIS — K219 Gastro-esophageal reflux disease without esophagitis: Secondary | ICD-10-CM | POA: Insufficient documentation

## 2012-10-12 DIAGNOSIS — Z8739 Personal history of other diseases of the musculoskeletal system and connective tissue: Secondary | ICD-10-CM | POA: Insufficient documentation

## 2012-10-12 DIAGNOSIS — S7001XA Contusion of right hip, initial encounter: Secondary | ICD-10-CM

## 2012-10-12 DIAGNOSIS — Z87442 Personal history of urinary calculi: Secondary | ICD-10-CM | POA: Insufficient documentation

## 2012-10-12 DIAGNOSIS — W19XXXA Unspecified fall, initial encounter: Secondary | ICD-10-CM

## 2012-10-12 DIAGNOSIS — W208XXA Other cause of strike by thrown, projected or falling object, initial encounter: Secondary | ICD-10-CM | POA: Insufficient documentation

## 2012-10-12 DIAGNOSIS — IMO0002 Reserved for concepts with insufficient information to code with codable children: Secondary | ICD-10-CM | POA: Insufficient documentation

## 2012-10-12 DIAGNOSIS — S7000XA Contusion of unspecified hip, initial encounter: Secondary | ICD-10-CM | POA: Insufficient documentation

## 2012-10-12 DIAGNOSIS — Z862 Personal history of diseases of the blood and blood-forming organs and certain disorders involving the immune mechanism: Secondary | ICD-10-CM | POA: Insufficient documentation

## 2012-10-12 DIAGNOSIS — Y9301 Activity, walking, marching and hiking: Secondary | ICD-10-CM | POA: Insufficient documentation

## 2012-10-12 DIAGNOSIS — Z7982 Long term (current) use of aspirin: Secondary | ICD-10-CM | POA: Insufficient documentation

## 2012-10-12 NOTE — ED Notes (Signed)
Larey Seat this am, outside coming from mailbox,"My lt hip gave way".  Pain low back and both hips,  No LOC, No HI or neck pain

## 2012-10-12 NOTE — ED Provider Notes (Signed)
History    CSN: [id number] Arrival date & time 10/12/12  1316  First MD Initiated Contact with Patient 10/12/12 1450     Chief Complaint  Patient presents with  . Fall   (Consider location/radiation/quality/duration/timing/severity/associated sxs/prior Treatment) HPI Comments: 72 y/o female who presents with recurrent fall onto the R hip and lower back that occurred a couple of hours prior to arrival - states that she lost her balance when she was walking towards her mailbox, accidentally fell on her lower back. Her pain has been mild, persistent, worse with ambulation. This did not prevent her from getting up off the ground and walking back to the house. This did not prevent her from driving to a funeral, and then driving to the hospital. She has no numbness or weakness of the lower extremities, no head injury, no neck pain. She had a recent MRI of her hip showing no signs of fractures or dislocations. She has had a prior hip surgery on the left.  Patient is a 72 y.o. female presenting with fall. The history is provided by the patient and medical records.  Fall Pertinent negatives include no headaches.   Past Medical History  Diagnosis Date  . Hypertension   . ASCVD (arteriosclerotic cardiovascular disease)     60% left anterior descending lesion in 2002; stress nuclear in 05/2009-abnormal septal motion  borderline left ventricular size; breast attenuation artifact without evidence for ischemia;  Echocardiogram-EF of 40-45% with paradoxic septal motion;  . Hyperlipidemia   . Left bundle branch block   . Hypothyroidism   . Obesity   . Anemia      H/H of 10.3/29.6 with a normal MCV in 07/2007 following surgery  . Chronic insomnia   . DJD (degenerative joint disease)     s/p left THA; requires reoperation due to fracture of prosthesis  . GERD (gastroesophageal reflux disease)   . Nephrolithiasis   . Coronary artery disease    Past Surgical History  Procedure Laterality Date  .  Revision total hip arthroplasty  2009    Left THA 2009  . Appendectomy    . Abdominal hysterectomy       (partial)  . Nose surgery    . Hemorroidectomy    . Colon polyps      remote past  . Cholecystectomy    . Colonoscopy  Aug 2013    Dr. Loreta Ave, [facility]: small internal hemorrhoids and few scattered sigmoid diverticula, otherwise normal up to TI, TI appeared normal  . Esophagogastroduodenoscopy N/A 06/01/2012    RMR:5 cm hiatal hernia. Essentially normal esophagus  . Joint replacement     Family History  Problem Relation Age of Onset  . Colon cancer Neg Hx    History  Substance Use Topics  . Smoking status: Never Smoker   . Smokeless tobacco: Not on file  . Alcohol Use: No   OB History   Grav Para Term Preterm Abortions TAB SAB Ect Mult Living                 Review of Systems  HENT: Negative for neck pain.   Musculoskeletal: Positive for back pain ( Mild). Negative for joint swelling.  Skin: Negative for rash and wound.  Neurological: Negative for headaches.    Allergies  Codeine  Home Medications   Current Outpatient Rx  Name  Route  Sig  Dispense  Refill  . amLODipine (NORVASC) 10 MG tablet   Oral   Take 10 mg by  mouth every morning.          Marland Kitchen aspirin 81 MG tablet   Oral   Take 81 mg by mouth every morning.          . Calcium Carbonate-Vitamin D (CALCIUM 600 + D PO)   Oral   Take 1 tablet by mouth 2 (two) times daily.         Marland Kitchen CARAFATE 1 GM/10ML suspension   Oral   Take 500 mg by mouth 4 (four) times daily.          . furosemide (LASIX) 40 MG tablet   Oral   Take 40 mg by mouth every other day.          . gabapentin (NEURONTIN) 100 MG capsule   Oral   Take 100 mg by mouth at bedtime.         Marland Kitchen HYDROcodone-acetaminophen (NORCO) 10-325 MG per tablet   Oral   Take 1 tablet by mouth every 6 (six) hours as needed.          Marland Kitchen lisinopril (PRINIVIL,ZESTRIL) 20 MG tablet   Oral   Take 20 mg by mouth 2 (two) times  daily.          . Multiple Vitamin (MULTIVITAMIN WITH MINERALS) TABS   Oral   Take 1 tablet by mouth daily.         Marland Kitchen omeprazole (PRILOSEC) 20 MG capsule   Oral   Take 20 mg by mouth 2 (two) times daily.          . pravastatin (PRAVACHOL) 40 MG tablet   Oral   Take 40 mg by mouth at bedtime.          . nitroGLYCERIN (NITROSTAT) 0.4 MG SL tablet   Sublingual   Place 0.4 mg under the tongue every 5 (five) minutes as needed. For chest pain           BP 125/61  Pulse 77  Temp(Src) 98.2 F (36.8 C) (Oral)  Resp 20  Ht 5\' 3"  (1.6 m)  Wt 225 lb (102.059 kg)  BMI 39.87 kg/m2  SpO2 99% Physical Exam  Nursing note and vitals reviewed. Constitutional: She appears well-developed and well-nourished. No distress.  HENT:  Head: Normocephalic and atraumatic.  Mouth/Throat: Oropharynx is clear and moist. No oropharyngeal exudate.  Eyes: Conjunctivae and EOM are normal. Pupils are equal, round, and reactive to light. Right eye exhibits no discharge. Left eye exhibits no discharge. No scleral icterus.  Neck: Normal range of motion. Neck supple. No JVD present. No thyromegaly present.  Cardiovascular: Normal rate, regular rhythm, normal heart sounds and intact distal pulses.  Exam reveals no gallop and no friction rub.   No murmur heard. Pulmonary/Chest: Effort normal and breath sounds normal. No respiratory distress. She has no wheezes. She has no rales.  Abdominal: Soft. Bowel sounds are normal. She exhibits no distension and no mass. There is no tenderness.  Musculoskeletal: Normal range of motion. She exhibits tenderness ( Mild tenderness to palpation over the right greater trochanteric bursa, no pain with range of motion of the right hip). She exhibits no edema.  No tenderness over the cervical thoracic or lumbar spines  Lymphadenopathy:    She has no cervical adenopathy.  Neurological: She is alert. Coordination normal.  No numbness or weakness of the lower extremities on the  right and left. Able to straight-leg raise bilaterally without difficulty  Skin: Skin is warm and dry. No rash noted. No erythema.  Psychiatric: She has a normal mood and affect. Her behavior is normal.    ED Course  Procedures (including critical care time) Labs Reviewed - No data to display Dg Hip Complete Right  10/12/2012   *RADIOLOGY REPORT*  Clinical Data: Right hip weakness resulting in a fall.  RIGHT HIP - COMPLETE 2+ VIEW  Comparison: None.  Findings: Mild right femoral head and neck junction spur formation. No fracture or dislocation.  Left total hip prosthesis.  Mild lower lumbar spine degenerative changes.  IMPRESSION: No fracture or dislocation.   Original Report Authenticated By: Beckie Salts, M.D.   1. Contusion of right hip, initial encounter   2. Back pain   3. Fall, initial encounter     MDM  The patient appears comfortable, she has been relatively stable and ambulatory with minimal difficulty. She will need x-rays to rule out small fractures of her hip or pelvis, she refuses pain medication at this time, requests fluids, no head injury, no neurologic compromise.  xrays reviewed, no signs of fracture, pt ambulatory and stable for d/c.  Vida Roller, MD 10/12/12 [phone]

## 2012-10-20 ENCOUNTER — Other Ambulatory Visit (HOSPITAL_COMMUNITY): Payer: Self-pay | Admitting: Family Medicine

## 2012-10-20 DIAGNOSIS — Z139 Encounter for screening, unspecified: Secondary | ICD-10-CM

## 2012-11-07 ENCOUNTER — Other Ambulatory Visit (HOSPITAL_COMMUNITY): Payer: Self-pay | Admitting: Family Medicine

## 2012-11-07 DIAGNOSIS — Z87442 Personal history of urinary calculi: Secondary | ICD-10-CM

## 2012-11-09 ENCOUNTER — Ambulatory Visit (HOSPITAL_COMMUNITY)
Admission: RE | Admit: 2012-11-09 | Discharge: 2012-11-09 | Disposition: A | Discharge status: Home / Self Care | Payer: Medicare Other | Source: Ambulatory Visit | Attending: Family Medicine | Admitting: Family Medicine

## 2012-11-09 ENCOUNTER — Ambulatory Visit (HOSPITAL_COMMUNITY): Payer: Medicare Other

## 2012-11-09 DIAGNOSIS — R109 Unspecified abdominal pain: Secondary | ICD-10-CM | POA: Insufficient documentation

## 2012-11-09 DIAGNOSIS — Z87442 Personal history of urinary calculi: Secondary | ICD-10-CM

## 2012-11-10 ENCOUNTER — Ambulatory Visit (HOSPITAL_COMMUNITY)
Admission: RE | Admit: 2012-11-10 | Discharge: 2012-11-10 | Disposition: A | Discharge status: Home / Self Care | Payer: Medicare Other | Source: Ambulatory Visit | Attending: Family Medicine | Admitting: Family Medicine

## 2012-11-10 DIAGNOSIS — Z1231 Encounter for screening mammogram for malignant neoplasm of breast: Secondary | ICD-10-CM | POA: Insufficient documentation

## 2012-11-10 DIAGNOSIS — Z139 Encounter for screening, unspecified: Secondary | ICD-10-CM

## 2012-12-29 ENCOUNTER — Ambulatory Visit (INDEPENDENT_AMBULATORY_CARE_PROVIDER_SITE_OTHER): Payer: Medicare Other | Admitting: Gastroenterology

## 2012-12-29 ENCOUNTER — Encounter: Payer: Self-pay | Admitting: Gastroenterology

## 2012-12-29 ENCOUNTER — Other Ambulatory Visit: Payer: Self-pay | Admitting: Gastroenterology

## 2012-12-29 VITALS — BP 91/57 | HR 88 | Temp 99.1°F | Ht 63.0 in | Wt 222.0 lb

## 2012-12-29 DIAGNOSIS — K625 Hemorrhage of anus and rectum: Secondary | ICD-10-CM | POA: Insufficient documentation

## 2012-12-29 DIAGNOSIS — R109 Unspecified abdominal pain: Secondary | ICD-10-CM

## 2012-12-29 NOTE — Patient Instructions (Addendum)
I would like to have you complete the CT scan to make sure the blood vessels in your belly are wide open.   Further recommendations once this is completed.

## 2012-12-29 NOTE — Assessment & Plan Note (Signed)
Reports one episode of burgundy/dark stool but denies true melena. Heme negative through PCP. No routine NSAIDs, avoids aspirin powders. Hgb 13.3. Encouraging that EGD is on file from Feb 2014; may want to revisit a colonoscopy with biopsy of terminal ileum. Last TCS in Aug 2013 with internal hemorrhoids and TI appeared normal but no biopsies. Performed outside facility. Question rectal bleeding secondary to known internal hemorrhoids; however, it is difficult to say if she had true melena. For now, I feel a CTA is paramount in ruling out mesenteric ischemia first. After this is reviewed, consider updated TCS with biopsies of TI and/or capsule study to rule out small bowel etiology. Further recommendations to follow. Patient is to call if any further evidence of bleeding.

## 2012-12-29 NOTE — Progress Notes (Signed)
CC'd to PCP 

## 2012-12-29 NOTE — Assessment & Plan Note (Signed)
Persistent epigastric, supraumbilical abdominal pain with negative EGD on file from earlier this year. Worsened by eating, high concern for mesenteric ischemia at this point. Needs CT angiogram prior to any further work-up. Differential also includes non-ulcer dyspepsia, chronic abdominal wall pain.   CTA as soon as possible Further recommendations to follow

## 2012-12-29 NOTE — Progress Notes (Signed)
Referring Provider: [name], MD Primary Care Physician:  [name], MD Primary GI: Dr. Jena Gauss   Chief Complaint  Patient presents with  . Rectal Bleeding    HPI:   [name] presents today at the request of Dr. [name] secondary to rectal bleeding and possible melena. Heme negative on exam through PCP. Chronic history of abdominal pain.EGD Feb 2014 by RMR:5 cm hiatal hernia. Essentially normal esophagus. Aug 2013 TCS by Dr. Loreta Ave with small internal hemorrhoids, few scattered sigmoid diverticula, TI appeared normal but no biopsies taken.. Last CT Jan 2014 with thickening of distal and terminal ileum with findings suggestive of an ileitis.  Hgb 13.13 Nov 2012  Prilosec 20 mg BID, told by PCP to increase to 40 mg BID. Hasn't started yet. Dexilant no improvement. Protonix didn't help at all, which I prescribed from last visit.   Continues to report persistent epigastric discomfort, constant. Tries to eat soft food, epigastric discomfort with eating. A few Ibuprofen this week, otherwise no NSAIDs. No aspirin powders. Makes herself eat but "it hurts". Down 4 lbs from last visit. States she saw burgundy/dark blood in stool about 3-4 weeks ago. Denies black/tarry stool. Saw one time. Chronic fatigue, no acute changes.   Past Medical History  Diagnosis Date  . Hypertension   . ASCVD (arteriosclerotic cardiovascular disease)     60% left anterior descending lesion in 2002; stress nuclear in 05/2009-abnormal septal motion  borderline left ventricular size; breast attenuation artifact without evidence for ischemia;  Echocardiogram-EF of 40-45% with paradoxic septal motion;  . Hyperlipidemia   . Left bundle branch block   . Hypothyroidism   . Obesity   . Anemia      H/H of 10.3/29.6 with a normal MCV in 07/2007 following surgery  . Chronic insomnia   . DJD (degenerative joint disease)     s/p left THA; requires reoperation due to fracture of prosthesis  . GERD (gastroesophageal reflux  disease)   . Nephrolithiasis   . Coronary artery disease     Past Surgical History  Procedure Laterality Date  . Revision total hip arthroplasty  2009    Left THA 2009  . Appendectomy    . Abdominal hysterectomy       (partial)  . Nose surgery    . Hemorroidectomy    . Colon polyps      remote past  . Cholecystectomy    . Colonoscopy  Aug 2013    Dr. Loreta Ave, [facility]: small internal hemorrhoids and few scattered sigmoid diverticula, otherwise normal up to TI, TI appeared normal  . Esophagogastroduodenoscopy N/A 06/01/2012    RMR:5 cm hiatal hernia. Essentially normal esophagus  . Joint replacement      Current Outpatient Prescriptions  Medication Sig Dispense Refill  . amLODipine (NORVASC) 10 MG tablet Take 10 mg by mouth every morning.       Marland Kitchen aspirin 81 MG tablet Take 81 mg by mouth every morning.       . Calcium Carbonate-Vitamin D (CALCIUM 600 + D PO) Take 1 tablet by mouth 2 (two) times daily.      Marland Kitchen CARAFATE 1 GM/10ML suspension Take 500 mg by mouth 4 (four) times daily.       . furosemide (LASIX) 40 MG tablet Take 40 mg by mouth every other day.       . gabapentin (NEURONTIN) 100 MG capsule Take 100 mg by mouth at bedtime.      Marland Kitchen HYDROcodone-acetaminophen (NORCO) 10-325 MG per  tablet Take 1 tablet by mouth every 6 (six) hours as needed.       Marland Kitchen lisinopril (PRINIVIL,ZESTRIL) 20 MG tablet Take 20 mg by mouth 2 (two) times daily.       . Multiple Vitamin (MULTIVITAMIN WITH MINERALS) TABS Take 1 tablet by mouth daily.      . nitroGLYCERIN (NITROSTAT) 0.4 MG SL tablet Place 0.4 mg under the tongue every 5 (five) minutes as needed. For chest pain       . omeprazole (PRILOSEC) 20 MG capsule Take 20 mg by mouth 2 (two) times daily.       . pravastatin (PRAVACHOL) 40 MG tablet Take 40 mg by mouth at bedtime.       . VOLTAREN 1 % GEL Apply 2 g topically 2 (two) times daily.        No current facility-administered medications for this visit.    Allergies as of  12/29/2012 - Review Complete 12/29/2012  Allergen Reaction Noted  . Codeine Itching 02/19/2009    Family History  Problem Relation Age of Onset  . Colon cancer Neg Hx     History   Social History  . Marital Status: Widowed    Spouse Name: N/A    Number of Children: N/A  . Years of Education: N/A   Occupational History  . retired     used to work at [employer] in [place]  .     Social History Main Topics  . Smoking status: Never Smoker   . Smokeless tobacco: None  . Alcohol Use: No  . Drug Use: No  . Sexual Activity: Yes    Birth Control/ Protection: Surgical   Other Topics Concern  . None   Social History Narrative  . None    Review of Systems: As mentioned in HPI.   Physical Exam: BP 91/57  Pulse 88  Temp(Src) 99.1 F (37.3 C) (Oral)  Ht 5\' 3"  (1.6 m)  Wt 222 lb (100.699 kg)  BMI 39.34 kg/m2 General:   Alert and oriented. No distress noted. Pleasant and cooperative.  Head:  Normocephalic and atraumatic. Eyes:  Conjuctiva clear without scleral icterus. Heart:  S1, S2 present without murmurs, rubs, or gallops. Regular rate and rhythm. Abdomen:  +BS, soft, TTP above umbilicus and epigastric region,  non-distended. No rebound or guarding. No HSM or masses noted. Msk:  Symmetrical without gross deformities. Normal posture. Extremities:  Without edema. Neurologic:  Alert and  oriented x4;  grossly normal neurologically. Skin:  Intact without significant lesions or rashes. Psych:  Alert and cooperative. Normal mood and affect.  Aug 2014:  Hgb 13.3  Heme negative

## 2012-12-30 ENCOUNTER — Other Ambulatory Visit: Payer: Self-pay | Admitting: Gastroenterology

## 2012-12-30 LAB — CREATININE, SERUM: Creat: 1.31 mg/dL — ABNORMAL HIGH (ref 0.50–1.10)

## 2013-01-03 ENCOUNTER — Encounter (HOSPITAL_COMMUNITY): Payer: Self-pay

## 2013-01-03 ENCOUNTER — Ambulatory Visit (HOSPITAL_COMMUNITY)
Admission: RE | Admit: 2013-01-03 | Discharge: 2013-01-03 | Disposition: A | Discharge status: Home / Self Care | Payer: Medicare Other | Source: Ambulatory Visit | Attending: Gastroenterology | Admitting: Gastroenterology

## 2013-01-03 DIAGNOSIS — R109 Unspecified abdominal pain: Secondary | ICD-10-CM | POA: Insufficient documentation

## 2013-01-03 DIAGNOSIS — K5730 Diverticulosis of large intestine without perforation or abscess without bleeding: Secondary | ICD-10-CM | POA: Insufficient documentation

## 2013-01-03 MED ORDER — IOHEXOL 350 MG/ML SOLN
100.0000 mL | Freq: Once | INTRAVENOUS | Status: AC | PRN
  Administered 2013-01-03: 100 mL via INTRAVENOUS

## 2013-01-05 NOTE — Progress Notes (Signed)
Quick Note:  Negative for mesenteric ischemia.  Any further evidence of bleeding?  How is pain ______

## 2013-01-16 NOTE — Progress Notes (Signed)
CTA negative for mesenteric ischemia.  Let's offer f/u visit

## 2013-01-19 ENCOUNTER — Emergency Department (HOSPITAL_COMMUNITY)
Admission: EM | Admit: 2013-01-19 | Discharge: 2013-01-19 | Disposition: A | Discharge status: Home / Self Care | Payer: No Typology Code available for payment source | Attending: Emergency Medicine | Admitting: Emergency Medicine

## 2013-01-19 ENCOUNTER — Encounter (HOSPITAL_COMMUNITY): Payer: Self-pay | Admitting: Emergency Medicine

## 2013-01-19 DIAGNOSIS — Y9389 Activity, other specified: Secondary | ICD-10-CM | POA: Insufficient documentation

## 2013-01-19 DIAGNOSIS — Z79899 Other long term (current) drug therapy: Secondary | ICD-10-CM | POA: Insufficient documentation

## 2013-01-19 DIAGNOSIS — Y92410 Unspecified street and highway as the place of occurrence of the external cause: Secondary | ICD-10-CM | POA: Insufficient documentation

## 2013-01-19 DIAGNOSIS — I10 Essential (primary) hypertension: Secondary | ICD-10-CM | POA: Insufficient documentation

## 2013-01-19 DIAGNOSIS — I2510 Atherosclerotic heart disease of native coronary artery without angina pectoris: Secondary | ICD-10-CM | POA: Insufficient documentation

## 2013-01-19 DIAGNOSIS — E785 Hyperlipidemia, unspecified: Secondary | ICD-10-CM | POA: Insufficient documentation

## 2013-01-19 DIAGNOSIS — Z862 Personal history of diseases of the blood and blood-forming organs and certain disorders involving the immune mechanism: Secondary | ICD-10-CM | POA: Insufficient documentation

## 2013-01-19 DIAGNOSIS — Z87442 Personal history of urinary calculi: Secondary | ICD-10-CM | POA: Insufficient documentation

## 2013-01-19 DIAGNOSIS — K219 Gastro-esophageal reflux disease without esophagitis: Secondary | ICD-10-CM | POA: Insufficient documentation

## 2013-01-19 DIAGNOSIS — Z7982 Long term (current) use of aspirin: Secondary | ICD-10-CM | POA: Insufficient documentation

## 2013-01-19 DIAGNOSIS — S40029A Contusion of unspecified upper arm, initial encounter: Secondary | ICD-10-CM | POA: Insufficient documentation

## 2013-01-19 DIAGNOSIS — M1990 Unspecified osteoarthritis, unspecified site: Secondary | ICD-10-CM | POA: Insufficient documentation

## 2013-01-19 DIAGNOSIS — S40022A Contusion of left upper arm, initial encounter: Secondary | ICD-10-CM

## 2013-01-19 DIAGNOSIS — E669 Obesity, unspecified: Secondary | ICD-10-CM | POA: Insufficient documentation

## 2013-01-19 NOTE — ED Notes (Signed)
Patient with no complaints at this time. Respirations even and unlabored. Skin warm/dry. Discharge instructions reviewed with patient at this time. Patient given opportunity to voice concerns/ask questions. Patient discharged at this time and left Emergency Department with steady gait.   
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
## 2013-02-21 NOTE — Progress Notes (Signed)
Pt is already aware of CTA results. Darl Pikes, please schedule.

## 2013-02-23 ENCOUNTER — Encounter: Payer: Self-pay | Admitting: Gastroenterology

## 2013-02-23 NOTE — Progress Notes (Signed)
Pt is aware of OV on 12/8 at 2 with AS and appt card was mailed

## 2013-03-20 ENCOUNTER — Ambulatory Visit (INDEPENDENT_AMBULATORY_CARE_PROVIDER_SITE_OTHER): Payer: Medicare Other | Admitting: Gastroenterology

## 2013-03-20 ENCOUNTER — Encounter: Payer: Self-pay | Admitting: Gastroenterology

## 2013-03-20 ENCOUNTER — Encounter (INDEPENDENT_AMBULATORY_CARE_PROVIDER_SITE_OTHER): Payer: Self-pay

## 2013-03-20 VITALS — BP 116/61 | HR 83 | Temp 98.3°F | Wt 218.2 lb

## 2013-03-20 DIAGNOSIS — R109 Unspecified abdominal pain: Secondary | ICD-10-CM
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
## 2013-03-20 NOTE — Patient Instructions (Signed)
We will see you back in 6-8 months.  Please call with any concerns before then.   Have a wonderful Christmas!

## 2013-03-22 NOTE — Assessment & Plan Note (Addendum)
Chronic abdominal pain, +carnett's sign, thorough evaluation to include labs, CTA, EGD, and colonoscopy. Notes significant improvement over time. No concerning factors. Continue Prilosec 40 mg BID. Avoid trigger foods. Return in 6-8 months.

## 2013-03-23 NOTE — Progress Notes (Signed)
cc'd to pcp 

## 2013-04-13 DIAGNOSIS — K5792 Diverticulitis of intestine, part unspecified, without perforation or abscess without bleeding: Secondary | ICD-10-CM

## 2013-04-13 HISTORY — DX: Diverticulitis of intestine, part unspecified, without perforation or abscess without bleeding: K57.92

## 2013-07-06 ENCOUNTER — Inpatient Hospital Stay (HOSPITAL_COMMUNITY)
Admission: EM | Admit: 2013-07-06 | Discharge: 2013-07-09 | DRG: 392 | Disposition: A | Discharge status: Home / Self Care | Payer: Medicare Other | Attending: Internal Medicine | Admitting: Internal Medicine

## 2013-07-06 ENCOUNTER — Emergency Department (HOSPITAL_COMMUNITY): Payer: Medicare Other

## 2013-07-06 ENCOUNTER — Encounter (HOSPITAL_COMMUNITY): Payer: Self-pay | Admitting: Emergency Medicine

## 2013-07-06 DIAGNOSIS — K625 Hemorrhage of anus and rectum: Secondary | ICD-10-CM

## 2013-07-06 DIAGNOSIS — M1990 Unspecified osteoarthritis, unspecified site: Secondary | ICD-10-CM

## 2013-07-06 DIAGNOSIS — N200 Calculus of kidney: Secondary | ICD-10-CM

## 2013-07-06 DIAGNOSIS — K219 Gastro-esophageal reflux disease without esophagitis: Secondary | ICD-10-CM | POA: Diagnosis present

## 2013-07-06 DIAGNOSIS — I7090 Unspecified atherosclerosis: Secondary | ICD-10-CM

## 2013-07-06 DIAGNOSIS — E039 Hypothyroidism, unspecified: Secondary | ICD-10-CM

## 2013-07-06 DIAGNOSIS — D649 Anemia, unspecified: Secondary | ICD-10-CM

## 2013-07-06 DIAGNOSIS — K5792 Diverticulitis of intestine, part unspecified, without perforation or abscess without bleeding: Secondary | ICD-10-CM

## 2013-07-06 DIAGNOSIS — I2510 Atherosclerotic heart disease of native coronary artery without angina pectoris: Secondary | ICD-10-CM

## 2013-07-06 DIAGNOSIS — Z79899 Other long term (current) drug therapy: Secondary | ICD-10-CM

## 2013-07-06 DIAGNOSIS — I10 Essential (primary) hypertension: Secondary | ICD-10-CM

## 2013-07-06 DIAGNOSIS — E785 Hyperlipidemia, unspecified: Secondary | ICD-10-CM

## 2013-07-06 DIAGNOSIS — Z7982 Long term (current) use of aspirin: Secondary | ICD-10-CM

## 2013-07-06 DIAGNOSIS — G4700 Insomnia, unspecified: Secondary | ICD-10-CM

## 2013-07-06 DIAGNOSIS — Z87442 Personal history of urinary calculi: Secondary | ICD-10-CM

## 2013-07-06 DIAGNOSIS — R109 Unspecified abdominal pain: Secondary | ICD-10-CM

## 2013-07-06 DIAGNOSIS — Z96649 Presence of unspecified artificial hip joint: Secondary | ICD-10-CM

## 2013-07-06 DIAGNOSIS — K5732 Diverticulitis of large intestine without perforation or abscess without bleeding: Principal | ICD-10-CM

## 2013-07-06 LAB — URINALYSIS, ROUTINE W REFLEX MICROSCOPIC
Bilirubin Urine: NEGATIVE
Glucose, UA: NEGATIVE mg/dL
Hgb urine dipstick: NEGATIVE
KETONES UR: 15 mg/dL — AB
Leukocytes, UA: NEGATIVE
Nitrite: NEGATIVE
Protein, ur: NEGATIVE mg/dL
Specific Gravity, Urine: >1.03 — ABNORMAL HIGH (ref 1.005–1.030)
Urobilinogen, UA: 1 mg/dL (ref 0.0–1.0)
pH: 5.5 (ref 5.0–8.0)

## 2013-07-06 LAB — CBC WITH DIFFERENTIAL/PLATELET
BASOS ABS: 0 10*3/uL (ref 0.0–0.1)
Basophils Relative: 0 % (ref 0–1)
Eosinophils Absolute: 0.1 10*3/uL (ref 0.0–0.7)
Eosinophils Relative: 1 % (ref 0–5)
HCT: 35.3 % — ABNORMAL LOW (ref 36.0–46.0)
Hemoglobin: 11.5 g/dL — ABNORMAL LOW (ref 12.0–15.0)
Lymphocytes Relative: 30 % (ref 12–46)
Lymphs Abs: 2.6 10*3/uL (ref 0.7–4.0)
MCH: 30.3 pg (ref 26.0–34.0)
MCHC: 32.6 g/dL (ref 30.0–36.0)
MCV: 93.1 fL (ref 78.0–100.0)
MONO ABS: 0.9 10*3/uL (ref 0.1–1.0)
Monocytes Relative: 11 % (ref 3–12)
NEUTROS PCT: 58 % (ref 43–77)
Neutro Abs: 4.9 10*3/uL (ref 1.7–7.7)
Platelets: 286 10*3/uL (ref 150–400)
RBC: 3.79 MIL/uL — ABNORMAL LOW (ref 3.87–5.11)
RDW: 13.1 % (ref 11.5–15.5)
WBC: 8.5 10*3/uL (ref 4.0–10.5)

## 2013-07-06 LAB — COMPREHENSIVE METABOLIC PANEL
ALT: 12 U/L (ref 0–35)
AST: 21 U/L (ref 0–37)
Albumin: 3.5 g/dL (ref 3.5–5.2)
Alkaline Phosphatase: 89 U/L (ref 39–117)
BILIRUBIN TOTAL: 0.5 mg/dL (ref 0.3–1.2)
BUN: 14 mg/dL (ref 6–23)
CHLORIDE: 103 meq/L (ref 96–112)
CO2: 25 mEq/L (ref 19–32)
CREATININE: 0.88 mg/dL (ref 0.50–1.10)
Calcium: 9.7 mg/dL (ref 8.4–10.5)
GFR calc Af Amer: 75 mL/min — ABNORMAL LOW (ref >90)
GFR calc non Af Amer: 65 mL/min — ABNORMAL LOW (ref >90)
Glucose, Bld: 96 mg/dL (ref 70–99)
POTASSIUM: 3.9 meq/L (ref 3.7–5.3)
Sodium: 141 mEq/L (ref 137–147)
Total Protein: 7.3 g/dL (ref 6.0–8.3)

## 2013-07-06 LAB — LIPASE, BLOOD: Lipase: 22 U/L (ref 11–59)

## 2013-07-06 MED ORDER — MORPHINE SULFATE 4 MG/ML IJ SOLN
4.0000 mg | INTRAMUSCULAR | Status: DC | PRN
  Administered 2013-07-06: 4 mg via INTRAVENOUS
  Filled 2013-07-06 (×2): qty 1

## 2013-07-06 MED ORDER — NITROGLYCERIN 0.4 MG SL SUBL: 0.4000 mg | SUBLINGUAL_TABLET | SUBLINGUAL | Status: DC | PRN

## 2013-07-06 MED ORDER — ONDANSETRON HCL 4 MG/2ML IJ SOLN: 4.0000 mg | Freq: Four times a day (QID) | INTRAMUSCULAR | Status: DC | PRN

## 2013-07-06 MED ORDER — METRONIDAZOLE IN NACL 5-0.79 MG/ML-% IV SOLN: 500.0000 mg | Freq: Three times a day (TID) | INTRAVENOUS | Status: DC

## 2013-07-06 MED ORDER — FUROSEMIDE 40 MG PO TABS
40.0000 mg | ORAL_TABLET | Freq: Every day | ORAL | Status: DC
  Administered 2013-07-07: 40 mg via ORAL
  Filled 2013-07-06: qty 1

## 2013-07-06 MED ORDER — GABAPENTIN 100 MG PO CAPS
100.0000 mg | ORAL_CAPSULE | Freq: Every day | ORAL | Status: DC
  Administered 2013-07-06 – 2013-07-08 (×3): 100 mg via ORAL
  Filled 2013-07-06 (×5): qty 1

## 2013-07-06 MED ORDER — FENTANYL CITRATE 0.05 MG/ML IJ SOLN
50.0000 ug | Freq: Once | INTRAMUSCULAR | Status: AC
  Administered 2013-07-06: 50 ug via INTRAVENOUS
  Filled 2013-07-06: qty 2

## 2013-07-06 MED ORDER — SODIUM CHLORIDE 0.9 % IV BOLUS (SEPSIS)
500.0000 mL | Freq: Once | INTRAVENOUS | Status: AC
  Administered 2013-07-06: 500 mL via INTRAVENOUS

## 2013-07-06 MED ORDER — LISINOPRIL 10 MG PO TABS
20.0000 mg | ORAL_TABLET | Freq: Two times a day (BID) | ORAL | Status: DC
  Administered 2013-07-06 – 2013-07-07 (×2): 20 mg via ORAL
  Filled 2013-07-06 (×2): qty 2

## 2013-07-06 MED ORDER — SODIUM CHLORIDE 0.9 % IV SOLN
INTRAVENOUS | Status: DC
  Administered 2013-07-06: 21:00:00 via INTRAVENOUS

## 2013-07-06 MED ORDER — IOHEXOL 300 MG/ML  SOLN
100.0000 mL | Freq: Once | INTRAMUSCULAR | Status: AC | PRN
  Administered 2013-07-06: 100 mL via INTRAVENOUS

## 2013-07-06 MED ORDER — CIPROFLOXACIN IN D5W 400 MG/200ML IV SOLN
400.0000 mg | Freq: Two times a day (BID) | INTRAVENOUS | Status: DC
  Administered 2013-07-07 – 2013-07-08 (×3): 400 mg via INTRAVENOUS
  Filled 2013-07-06 (×3): qty 200

## 2013-07-06 MED ORDER — CIPROFLOXACIN IN D5W 400 MG/200ML IV SOLN: 400.0000 mg | Freq: Two times a day (BID) | INTRAVENOUS | Status: DC

## 2013-07-06 MED ORDER — HEPARIN SODIUM (PORCINE) 5000 UNIT/ML IJ SOLN
5000.0000 [IU] | Freq: Three times a day (TID) | INTRAMUSCULAR | Status: DC
  Administered 2013-07-06 – 2013-07-09 (×8): 5000 [IU] via SUBCUTANEOUS
  Filled 2013-07-06 (×8): qty 1

## 2013-07-06 MED ORDER — IOHEXOL 300 MG/ML  SOLN
50.0000 mL | Freq: Once | INTRAMUSCULAR | Status: AC | PRN
  Administered 2013-07-06: 50 mL via ORAL

## 2013-07-06 MED ORDER — CIPROFLOXACIN IN D5W 400 MG/200ML IV SOLN
400.0000 mg | Freq: Once | INTRAVENOUS | Status: AC
  Administered 2013-07-06: 400 mg via INTRAVENOUS
  Filled 2013-07-06: qty 200

## 2013-07-06 MED ORDER — ONDANSETRON HCL 4 MG/2ML IJ SOLN
4.0000 mg | Freq: Once | INTRAMUSCULAR | Status: AC
  Administered 2013-07-06: 4 mg via INTRAVENOUS
  Filled 2013-07-06: qty 2

## 2013-07-06 MED ORDER — AMLODIPINE BESYLATE 5 MG PO TABS
10.0000 mg | ORAL_TABLET | Freq: Every morning | ORAL | Status: DC
  Administered 2013-07-07 – 2013-07-09 (×3): 10 mg via ORAL
  Filled 2013-07-06 (×3): qty 2

## 2013-07-06 MED ORDER — SIMVASTATIN 20 MG PO TABS
20.0000 mg | ORAL_TABLET | Freq: Every day | ORAL | Status: DC
  Administered 2013-07-06 – 2013-07-08 (×3): 20 mg via ORAL
  Filled 2013-07-06 (×5): qty 1

## 2013-07-06 MED ORDER — ADULT MULTIVITAMIN W/MINERALS CH
1.0000 | ORAL_TABLET | Freq: Every day | ORAL | Status: DC
  Administered 2013-07-07 – 2013-07-09 (×3): 1 via ORAL
  Filled 2013-07-06 (×3): qty 1

## 2013-07-06 MED ORDER — PANTOPRAZOLE SODIUM 40 MG PO TBEC
40.0000 mg | DELAYED_RELEASE_TABLET | Freq: Every day | ORAL | Status: DC
  Administered 2013-07-06 – 2013-07-09 (×4): 40 mg via ORAL
  Filled 2013-07-06 (×4): qty 1

## 2013-07-06 MED ORDER — DICLOFENAC SODIUM 1 % TD GEL
2.0000 g | Freq: Two times a day (BID) | TRANSDERMAL | Status: DC
  Administered 2013-07-07 – 2013-07-09 (×4): 2 g via TOPICAL
  Filled 2013-07-06 (×2): qty 100

## 2013-07-06 MED ORDER — ONDANSETRON HCL 4 MG PO TABS: 4.0000 mg | ORAL_TABLET | Freq: Four times a day (QID) | ORAL | Status: DC | PRN

## 2013-07-06 MED ORDER — METRONIDAZOLE IN NACL 5-0.79 MG/ML-% IV SOLN
500.0000 mg | Freq: Once | INTRAVENOUS | Status: DC
  Filled 2013-07-06: qty 100

## 2013-07-06 MED ORDER — METRONIDAZOLE IN NACL 5-0.79 MG/ML-% IV SOLN
500.0000 mg | Freq: Three times a day (TID) | INTRAVENOUS | Status: DC
  Administered 2013-07-06 – 2013-07-08 (×5): 500 mg via INTRAVENOUS
  Filled 2013-07-06 (×4): qty 100

## 2013-07-06 NOTE — ED Provider Notes (Signed)
CSN: [id number]     Arrival date & time 07/06/13  1322 History  This chart was scribed for [employer]. Rubin Payor, MD by Quintella Reichert, ED scribe.  This patient was seen in room APA19/APA19 and the patient's care was started at 2:16 PM.   Chief Complaint  Patient presents with  . Abdominal Pain    The history is provided by the patient. No language interpreter was used.    HPI Comments: [name] is a 73 y.o. female who presents to the Emergency Department complaining of progressively-worsening diffuse abdominal pain that began 2 days ago.  Pt saw her PCP after her pain began and was diagnosed with a UTI and placed on antibiotics.  She states her pain has worsened since then and this morning she also developed rectal bleeding described as bright red blood and "white gel-like stuff" on a pad she was wearing.  The blood was not associated with a bowel movement.  She denies hematuria, dysuria, frequency.  Pt denies prior h/o similar pain.   Past Medical History  Diagnosis Date  . Hypertension   . ASCVD (arteriosclerotic cardiovascular disease)     60% left anterior descending lesion in 2002; stress nuclear in 05/2009-abnormal septal motion  borderline left ventricular size; breast attenuation artifact without evidence for ischemia;  Echocardiogram-EF of 40-45% with paradoxic septal motion;  . Hyperlipidemia   . Left bundle branch block   . Hypothyroidism   . Obesity   . Anemia      H/H of 10.3/29.6 with a normal MCV in 07/2007 following surgery  . Chronic insomnia   . DJD (degenerative joint disease)     s/p left THA; requires reoperation due to fracture of prosthesis  . GERD (gastroesophageal reflux disease)   . Coronary artery disease   . Nephrolithiasis     Past Surgical History  Procedure Laterality Date  . Revision total hip arthroplasty  2009    Left THA 2009  . Appendectomy    . Abdominal hysterectomy       (partial)  . Nose surgery    . Hemorroidectomy    . Colon  polyps      remote past  . Cholecystectomy    . Colonoscopy  Aug 2013    Dr. Loreta Ave, [facility]: small internal hemorrhoids and few scattered sigmoid diverticula, otherwise normal up to TI, TI appeared normal  . Esophagogastroduodenoscopy N/A 06/01/2012    RMR:5 cm hiatal hernia. Essentially normal esophagus  . Joint replacement      Family History  Problem Relation Age of Onset  . Colon cancer Neg Hx     History  Substance Use Topics  . Smoking status: Never Smoker   . Smokeless tobacco: Not on file  . Alcohol Use: No    OB History   Grav Para Term Preterm Abortions TAB SAB Ect Mult Living                   Review of Systems  Gastrointestinal: Positive for abdominal pain and anal bleeding.  Genitourinary: Negative for dysuria, frequency and hematuria.  All other systems reviewed and are negative.      Allergies  Codeine  Home Medications   No current outpatient prescriptions on file. BP 113/56  Pulse 69  Temp(Src) 97.9 F (36.6 C) (Oral)  Resp 17  Ht 5\' 3"  (1.6 m)  Wt 214 lb (97.07 kg)  BMI 37.92 kg/m2  SpO2 99%  Physical Exam  Nursing note and vitals  reviewed. Constitutional: She is oriented to person, place, and time. She appears well-developed and well-nourished. No distress.  HENT:  Head: Normocephalic and atraumatic.  Eyes: EOM are normal.  Neck: Neck supple. No tracheal deviation present.  Cardiovascular: Normal rate.   Pulmonary/Chest: Effort normal and breath sounds normal. No respiratory distress. She has no wheezes. She has no rales.  Abdominal: There is tenderness. There is no rebound and no guarding. No hernia.  Diffusely tender but worse on the periphery  Musculoskeletal: Normal range of motion.  Neurological: She is alert and oriented to person, place, and time.  Skin: Skin is warm and dry.  Psychiatric: She has a normal mood and affect. Her behavior is normal.    ED Course  Procedures (including critical care  time)  DIAGNOSTIC STUDIES: Oxygen Saturation is 99% on room air, normal by my interpretation.    COORDINATION OF CARE: 2:19 PM-Discussed treatment plan which includes bloodwork, x-rays, UA, and symptomatic relief with pt at bedside and pt agreed to plan.     Labs Review Labs Reviewed  CBC WITH DIFFERENTIAL - Abnormal; Notable for the following:    RBC 3.79 (*)    Hemoglobin 11.5 (*)    HCT 35.3 (*)    All other components within normal limits  COMPREHENSIVE METABOLIC PANEL - Abnormal; Notable for the following:    GFR calc non Af Amer 65 (*)    GFR calc Af Amer 75 (*)    All other components within normal limits  URINALYSIS, ROUTINE W REFLEX MICROSCOPIC - Abnormal; Notable for the following:    Specific Gravity, Urine >1.030 (*)    Ketones, ur 15 (*)    All other components within normal limits  LIPASE, BLOOD  COMPREHENSIVE METABOLIC PANEL  CBC    Imaging Review Ct Abdomen Pelvis W Contrast  07/06/2013   CLINICAL DATA:  Abdominal pain and nausea. Previous appendectomy and hysterectomy.  EXAM: CT ABDOMEN AND PELVIS WITH CONTRAST  TECHNIQUE: Multidetector CT imaging of the abdomen and pelvis was performed using the standard protocol following bolus administration of intravenous contrast.  CONTRAST:  50mL OMNIPAQUE IOHEXOL 300 MG/ML SOLN, 100mL OMNIPAQUE IOHEXOL 300 MG/ML SOLN  COMPARISON:  01/03/2013.  FINDINGS: Moderate-sized sliding hiatal hernia. Multiple calcified splenic granulomata. Cholecystectomy clips. Multiple colonic diverticula. Concentric wall thickening with pericolonic soft tissue stranding involving the distal sigmoid colon in the inferior pelvis. No free peritoneal air or fluid collections. 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  Unremarkable pancreas, adrenal glands, kidneys and visualized portions of the urinary bladder. Surgically absent uterus. Small ovaries. No enlarged lymph nodes. Minimal linear atelectasis or  scarring at the lung bases. Lumbar and lower thoracic spine degenerative changes.  IMPRESSION: 1. Distal sigmoid colon diverticulitis without abscess. Sigmoidoscopic correlation is recommended to exclude an underlying neoplasm. 2. Colonic diverticulosis. 3. Moderate-sized hiatal hernia.   Electronically Signed   By: Gordan PaymentSteve  Reid M.D.   On: 07/06/2013 17:27     EKG Interpretation None      MDM   Final diagnoses:  Diverticulitis    Patient with abdominal pain and blood in the stool. Had guaiac done at PCP. Due to tenderness CT scan was done and showed diverticulitis. Will admit to internal medicine.    I personally performed the services described in this documentation, which was scribed in my presence. The recorded information has been reviewed and is accurate.     Juliet RudeNathan R. Rubin PayorPickering, MD 07/06/13 2232

## 2013-07-06 NOTE — H&P (Signed)
[facility] History and Physical  [name] ENM:[id number] DOB: 08-Oct-1940 DOA: 07/06/2013  Referring physician: ER. PCP: [name], MD   Chief Complaint: Abdominal pain.  HPI: [name] is a 73 y.o. female  This is a very pleasant 73 year old lady who presents with a 2 day history of mostly lower abdominal pain associated with nausea. She initially had loose stools and possibly one episode of rectal bleeding but she has not opened her bowels in the last 24 hours. There has been no vomiting. She has been feeling feverish subjectively. She does have a history of diverticular disease. Evaluation in the emergency room is consistent with diagnosis of diverticulitis. She is now being admitted for further treatment.   Review of Systems:  Constitutional:  No weight loss, night sweats,fatigue.  HEENT:  No headaches, Difficulty swallowing,Tooth/dental problems,Sore throat,  No sneezing, itching, ear ache, nasal congestion, post nasal drip,  Cardio-vascular:  No chest pain, Orthopnea, PND, swelling in lower extremities, anasarca, dizziness, palpitations  GI:  No heartburn, indigestion, vomiting,  loss of appetite  Resp:  No shortness of breath with exertion or at rest. No excess mucus, no productive cough, No non-productive cough, No coughing up of blood.No change in color of mucus.No wheezing.No chest wall deformity  Skin:  no rash or lesions.  GU:  no dysuria, change in color of urine, no urgency or frequency. No flank pain.  Musculoskeletal:  No joint pain or swelling. No decreased range of motion. No back pain.  Psych:  No change in mood or affect. No depression or anxiety. No memory loss.   Past Medical History  Diagnosis Date  . Hypertension   . ASCVD (arteriosclerotic cardiovascular disease)     60% left anterior descending lesion in 2002; stress nuclear in 05/2009-abnormal septal motion  borderline left ventricular size; breast attenuation artifact  without evidence for ischemia;  Echocardiogram-EF of 40-45% with paradoxic septal motion;  . Hyperlipidemia   . Left bundle branch block   . Hypothyroidism   . Obesity   . Anemia      H/H of 10.3/29.6 with a normal MCV in 07/2007 following surgery  . Chronic insomnia   . DJD (degenerative joint disease)     s/p left THA; requires reoperation due to fracture of prosthesis  . GERD (gastroesophageal reflux disease)   . Coronary artery disease   . Nephrolithiasis    Past Surgical History  Procedure Laterality Date  . Revision total hip arthroplasty  2009    Left THA 2009  . Appendectomy    . Abdominal hysterectomy       (partial)  . Nose surgery    . Hemorroidectomy    . Colon polyps      remote past  . Cholecystectomy    . Colonoscopy  Aug 2013    Dr. Loreta Ave, [facility]: small internal hemorrhoids and few scattered sigmoid diverticula, otherwise normal up to TI, TI appeared normal  . Esophagogastroduodenoscopy N/A 06/01/2012    RMR:5 cm hiatal hernia. Essentially normal esophagus  . Joint replacement     Social History:  reports that she has never smoked. She does not have any smokeless tobacco history on file. She reports that she does not drink alcohol or use illicit drugs.  Allergies  Allergen Reactions  . Codeine Itching    "feel like going buggy"  Itchy skin    Family History  Problem Relation Age of Onset  . Colon cancer Neg Hx  Prior to Admission medications   Medication Sig Start Date End Date Taking? Authorizing Provider  amLODipine (NORVASC) 10 MG tablet Take 10 mg by mouth every morning.    Yes Historical Provider, MD  aspirin 81 MG tablet Take 81 mg by mouth every morning.    Yes Historical Provider, MD  Calcium Carbonate-Vitamin D (CALCIUM 600 + D PO) Take 1 tablet by mouth 2 (two) times daily.   Yes Historical Provider, MD  ciprofloxacin (CIPRO) 250 MG tablet Take 1 tablet by mouth 2 (two) times daily. Starting 07/04/2013 x 10 days. 07/04/13   Yes Historical Provider, MD  furosemide (LASIX) 40 MG tablet Take 40 mg by mouth daily.    Yes Historical Provider, MD  gabapentin (NEURONTIN) 100 MG capsule Take 100 mg by mouth at bedtime.   Yes Historical Provider, MD  HYDROcodone-acetaminophen (NORCO) 10-325 MG per tablet Take 1 tablet by mouth 2 (two) times daily as needed for pain.  04/27/12  Yes Historical Provider, MD  lisinopril (PRINIVIL,ZESTRIL) 20 MG tablet Take 20 mg by mouth 2 (two) times daily.    Yes Historical Provider, MD  Multiple Vitamin (MULTIVITAMIN WITH MINERALS) TABS Take 1 tablet by mouth daily.   Yes Historical Provider, MD  nitroGLYCERIN (NITROSTAT) 0.4 MG SL tablet Place 0.4 mg under the tongue every 5 (five) minutes as needed. For chest pain    Yes Historical Provider, MD  omeprazole (PRILOSEC) 20 MG capsule Take 40 mg by mouth 2 (two) times daily.    Yes Historical Provider, MD  pravastatin (PRAVACHOL) 40 MG tablet Take 40 mg by mouth at bedtime.    Yes Historical Provider, MD  VOLTAREN 1 % GEL Apply 2 g topically 2 (two) times daily.  12/13/12  Yes Historical Provider, MD   Physical Exam: Filed Vitals:   07/06/13 1809  BP: 114/60  Pulse: 60  Temp:   Resp: 20    BP 114/60  Pulse 60  Temp(Src) 97.9 F (36.6 C) (Oral)  Resp 20  Ht 5\' 3"  (1.6 m)  Wt 97.07 kg (214 lb)  BMI 37.92 kg/m2  SpO2 96%  General:  Appears calm and comfortable. Clinically not toxic. Eyes: PERRL, normal lids, irises & conjunctiva ENT: grossly normal hearing, lips & tongue Neck: no LAD, masses or thyromegaly Cardiovascular: RRR, no m/r/g. No LE edema. Telemetry: SR, no arrhythmias  Respiratory: CTA bilaterally, no w/r/r. Normal respiratory effort. Abdomen: soft, tender in the lower abdomen, mostly in the left lower quadrant more than the right. Bowel sounds are present but scanty. Skin: no rash or induration seen on limited exam Musculoskeletal: grossly normal tone BUE/BLE Psychiatric: grossly normal mood and affect, speech fluent  and appropriate Neurologic: grossly non-focal.          Labs on Admission:  Basic Metabolic Panel:  Recent Labs Lab 07/06/13 1431  NA 141  K 3.9  CL 103  CO2 25  GLUCOSE 96  BUN 14  CREATININE 0.88  CALCIUM 9.7   Liver Function Tests:  Recent Labs Lab 07/06/13 1431  AST 21  ALT 12  ALKPHOS 89  BILITOT 0.5  PROT 7.3  ALBUMIN 3.5    Recent Labs Lab 07/06/13 1431  LIPASE 22    CBC:  Recent Labs Lab 07/06/13 1431  WBC 8.5  NEUTROABS 4.9  HGB 11.5*  HCT 35.3*  MCV 93.1  PLT 286      Radiological Exams on Admission: Ct Abdomen Pelvis W Contrast  07/06/2013   CLINICAL DATA:  Abdominal pain and  nausea. Previous appendectomy and hysterectomy.  EXAM: CT ABDOMEN AND PELVIS WITH CONTRAST  TECHNIQUE: Multidetector CT imaging of the abdomen and pelvis was performed using the standard protocol following bolus administration of intravenous contrast.  CONTRAST:  50mL OMNIPAQUE IOHEXOL 300 MG/ML SOLN, 100mL OMNIPAQUE IOHEXOL 300 MG/ML SOLN  COMPARISON:  01/03/2013.  FINDINGS: Moderate-sized sliding hiatal hernia. Multiple calcified splenic granulomata. Cholecystectomy clips. Multiple colonic diverticula. Concentric wall thickening with pericolonic soft tissue stranding involving the distal sigmoid colon in the inferior pelvis. No free peritoneal air or fluid collections. 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  Unremarkable pancreas, adrenal glands, kidneys and visualized portions of the urinary bladder. Surgically absent uterus. Small ovaries. No enlarged lymph nodes. Minimal linear atelectasis or scarring at the lung bases. Lumbar and lower thoracic spine degenerative changes.  IMPRESSION: 1. Distal sigmoid colon diverticulitis without abscess. Sigmoidoscopic correlation is recommended to exclude an underlying neoplasm. 2. Colonic diverticulosis. 3. Moderate-sized hiatal hernia.   Electronically Signed   By: Gordan PaymentSteve  Reid M.D.   On:  07/06/2013 17:27      Assessment/Plan   1. Diverticulitis. 2. Hypertension. Stable. 3. Coronary artery disease, stable. 4. Hypothyroidism, stable.  Plan: 1. Admit to medical floor. 2. Intravenous ciprofloxacin and metronidazole. 3. Intravenous fluids. 4. Analgesia as required and antiemetics as required.  Other recommendations will depend on hospital progress.   Code Status: Full code.   Family Communication: Discussed plan with patient at the bedside.   Disposition Plan: Home when medically stable.   Time spent: 45 minutes.  Wilson SingerGOSRANI,Hairo Garraway C [facility] Pager [phone].

## 2013-07-06 NOTE — ED Notes (Signed)
Pt reports 6/10 lower abdominal pain and BRB per rectum when she got out of bed this morning.

## 2013-07-07 DIAGNOSIS — R109 Unspecified abdominal pain: Secondary | ICD-10-CM

## 2013-07-07 LAB — CBC
HCT: 32 % — ABNORMAL LOW (ref 36.0–46.0)
HEMOGLOBIN: 10.5 g/dL — AB (ref 12.0–15.0)
MCH: 30.3 pg (ref 26.0–34.0)
MCHC: 32.8 g/dL (ref 30.0–36.0)
MCV: 92.2 fL (ref 78.0–100.0)
Platelets: 263 10*3/uL (ref 150–400)
RBC: 3.47 MIL/uL — AB (ref 3.87–5.11)
RDW: 13 % (ref 11.5–15.5)
WBC: 5.5 10*3/uL (ref 4.0–10.5)

## 2013-07-07 LAB — COMPREHENSIVE METABOLIC PANEL
ALT: 12 U/L (ref 0–35)
AST: 19 U/L (ref 0–37)
Albumin: 2.8 g/dL — ABNORMAL LOW (ref 3.5–5.2)
Alkaline Phosphatase: 79 U/L (ref 39–117)
BUN: 10 mg/dL (ref 6–23)
CALCIUM: 8.9 mg/dL (ref 8.4–10.5)
CO2: 26 mEq/L (ref 19–32)
CREATININE: 0.82 mg/dL (ref 0.50–1.10)
Chloride: 108 mEq/L (ref 96–112)
GFR calc Af Amer: 82 mL/min — ABNORMAL LOW (ref >90)
GFR, EST NON AFRICAN AMERICAN: 71 mL/min — AB (ref >90)
Glucose, Bld: 90 mg/dL (ref 70–99)
Potassium: 3.8 mEq/L (ref 3.7–5.3)
Sodium: 144 mEq/L (ref 137–147)
Total Bilirubin: 0.4 mg/dL (ref 0.3–1.2)
Total Protein: 6.1 g/dL (ref 6.0–8.3)

## 2013-07-07 MED ORDER — ACETAMINOPHEN 325 MG PO TABS
650.0000 mg | ORAL_TABLET | Freq: Four times a day (QID) | ORAL | Status: DC | PRN
  Administered 2013-07-07: 650 mg via ORAL
  Filled 2013-07-07: qty 2

## 2013-07-07 NOTE — Progress Notes (Signed)
UR chart review completed.  

## 2013-07-07 NOTE — Progress Notes (Signed)
[facility] PROGRESS NOTE  [name] ZOX:[id number] DOB: 30-Apr-1940 DOA: 07/06/2013 PCP: Evlyn CourierHILL,GERALD K, MD  Assessment/Plan:   1. Acute sigmoid diverticulitis -Start Clears -Continue IVF, Cipro/flagyl -supportive care -H/o colonoscopy last year per Dr.Mann reportedly normal  2. H/o CAD -stable, continue statin  3. HTN -BP soft, hold lisinopril, continue norvasc  DVT proph: hep SQ  Code Status: Full Code Family Communication: no family at bedside Disposition Plan: Home when improved  Antibiotics:  Cipro/Flagyl 3/26  HPI/Subjective: Still in pain, but improving  Objective: Filed Vitals:   07/07/13 1034  BP: 117/72  Pulse:   Temp:   Resp:     Intake/Output Summary (Last 24 hours) at 07/07/13 1056 Last data filed at 07/07/13 0600  Gross per 24 hour  Intake    800 ml  Output      0 ml  Net    800 ml   Filed Weights   07/06/13 1334 07/06/13 1940  Weight: 97.07 kg (214 lb) 97.8 kg (215 lb 9.8 oz)    Exam:   General: AAOx3, no distress  Cardiovascular: S1S2/RRR  Respiratory: CTAB  Abdomen: soft, obese, mild lower quadrant tenderness, BS present  Musculoskeletal: no edema c/c  Data Reviewed: Basic Metabolic Panel:  Recent Labs Lab 07/06/13 1431 07/07/13 0449  NA 141 144  K 3.9 3.8  CL 103 108  CO2 25 26  GLUCOSE 96 90  BUN 14 10  CREATININE 0.88 0.82  CALCIUM 9.7 8.9   Liver Function Tests:  Recent Labs Lab 07/06/13 1431 07/07/13 0449  AST 21 19  ALT 12 12  ALKPHOS 89 79  BILITOT 0.5 0.4  PROT 7.3 6.1  ALBUMIN 3.5 2.8*    Recent Labs Lab 07/06/13 1431  LIPASE 22   No results found for this basename: AMMONIA,  in the last 168 hours CBC:  Recent Labs Lab 07/06/13 1431 07/07/13 0449  WBC 8.5 5.5  NEUTROABS 4.9  --   HGB 11.5* 10.5*  HCT 35.3* 32.0*  MCV 93.1 92.2  PLT 286 263   Cardiac Enzymes: No results found for this basename: CKTOTAL, CKMB, CKMBINDEX, TROPONINI,  in the last 168 hours BNP (last  3 results) No results found for this basename: PROBNP,  in the last 8760 hours CBG: No results found for this basename: GLUCAP,  in the last 168 hours  No results found for this or any previous visit (from the past 240 hour(s)).   Studies: Ct Abdomen Pelvis W Contrast  07/06/2013   CLINICAL DATA:  Abdominal pain and nausea. Previous appendectomy and hysterectomy.  EXAM: CT ABDOMEN AND PELVIS WITH CONTRAST  TECHNIQUE: Multidetector CT imaging of the abdomen and pelvis was performed using the standard protocol following bolus administration of intravenous contrast.  CONTRAST:  50mL OMNIPAQUE IOHEXOL 300 MG/ML SOLN, 100mL OMNIPAQUE IOHEXOL 300 MG/ML SOLN  COMPARISON:  01/03/2013.  FINDINGS: Moderate-sized sliding hiatal hernia. Multiple calcified splenic granulomata. Cholecystectomy clips. Multiple colonic diverticula. Concentric wall thickening with pericolonic soft tissue stranding involving the distal sigmoid colon in the inferior pelvis. No free peritoneal air or fluid collections. 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  Unremarkable pancreas, adrenal glands, kidneys and visualized portions of the urinary bladder. Surgically absent uterus. Small ovaries. No enlarged lymph nodes. Minimal linear atelectasis or scarring at the lung bases. Lumbar and lower thoracic spine degenerative changes.  IMPRESSION: 1. Distal sigmoid colon diverticulitis without abscess. Sigmoidoscopic correlation is recommended to exclude an underlying neoplasm. 2. Colonic diverticulosis.  3. Moderate-sized hiatal hernia.   Electronically Signed   By: Gordan Payment M.D.   On: 07/06/2013 17:27    Scheduled Meds: . amLODipine  10 mg Oral q morning - 10a  . ciprofloxacin  400 mg Intravenous Q12H  . diclofenac sodium  2 g Topical BID  . furosemide  40 mg Oral Daily  . gabapentin  100 mg Oral QHS  . heparin  5,000 Units Subcutaneous 3 times per day  . lisinopril  20 mg Oral BID  .  metronidazole  500 mg Intravenous Q8H  . multivitamin with minerals  1 tablet Oral Daily  . pantoprazole  40 mg Oral Daily  . simvastatin  20 mg Oral q1800   Continuous Infusions: . sodium chloride 75 mL/hr at 07/06/13 2050   Antibiotics Given (last 72 hours)   Date/Time Action Medication Dose Rate   07/06/13 2050 Given   metroNIDAZOLE (FLAGYL) IVPB 500 mg 500 mg 100 mL/hr   07/07/13 0500 Given   metroNIDAZOLE (FLAGYL) IVPB 500 mg 500 mg 100 mL/hr   07/07/13 1038 Given   ciprofloxacin (CIPRO) IVPB 400 mg 400 mg 200 mL/hr      Active Problems:   HYPOTHYROIDISM   Hypertension   ASCVD (arteriosclerotic cardiovascular disease)   Diverticulitis    Time spent:    [facility]  [facility] Pager [phone]. If 7PM-7AM, please contact night-coverage at [email or web address], password [facility] 07/07/2013, 10:56 AM  LOS: 1 day

## 2013-07-07 NOTE — Care Management Note (Signed)
    Page 1 of 1   07/07/2013     10:58:44 AM   CARE MANAGEMENT NOTE 07/07/2013  Patient:  [name], [name]   Account Number:  [id number]  Date Initiated:  07/07/2013  Documentation initiated by:  Sharrie Rothman  Subjective/Objective Assessment:   Pt admitted from home with diverticulitis. Pt lives alone and will return home at discharge. Pt is independent with ADL's. Pt has cane and walker from previous hospitalization.     Action/Plan:   No CM needs noted.   Anticipated DC Date:  07/10/2013   Anticipated DC Plan:  HOME/SELF CARE      DC Planning Services  CM consult      Choice offered to / List presented to:             Status of service:  Completed, signed off Medicare Important Message given?  YES (If response is "NO", the following Medicare IM given date fields will be blank) Date Medicare IM given:  07/07/2013 Date Additional Medicare IM given:    Discharge Disposition:  HOME/SELF CARE  Per UR Regulation:    If discussed at Long Length of Stay Meetings, dates discussed:    Comments:  07/07/13 1100 Arlyss Queen, RN BSN CM

## 2013-07-08 DIAGNOSIS — K5732 Diverticulitis of large intestine without perforation or abscess without bleeding: Secondary | ICD-10-CM

## 2013-07-08 LAB — CBC
HEMATOCRIT: 32.4 % — AB (ref 36.0–46.0)
Hemoglobin: 10.7 g/dL — ABNORMAL LOW (ref 12.0–15.0)
MCH: 30.1 pg (ref 26.0–34.0)
MCHC: 33 g/dL (ref 30.0–36.0)
MCV: 91.3 fL (ref 78.0–100.0)
Platelets: 284 10*3/uL (ref 150–400)
RBC: 3.55 MIL/uL — ABNORMAL LOW (ref 3.87–5.11)
RDW: 12.6 % (ref 11.5–15.5)
WBC: 4 10*3/uL (ref 4.0–10.5)

## 2013-07-08 MED ORDER — METRONIDAZOLE 500 MG PO TABS
250.0000 mg | ORAL_TABLET | Freq: Three times a day (TID) | ORAL | Status: DC
  Administered 2013-07-08 – 2013-07-09 (×3): 250 mg via ORAL
  Filled 2013-07-08 (×3): qty 1

## 2013-07-08 MED ORDER — CIPROFLOXACIN HCL 250 MG PO TABS
500.0000 mg | ORAL_TABLET | Freq: Two times a day (BID) | ORAL | Status: DC
  Administered 2013-07-08 – 2013-07-09 (×2): 500 mg via ORAL
  Filled 2013-07-08 (×2): qty 2

## 2013-07-08 NOTE — Progress Notes (Signed)
[facility] PROGRESS NOTE  [name] MDY:[id number] DOB: 1940/09/25 DOA: 07/06/2013 PCP: Jillian Courier, MD  Assessment/Plan:  1. Acute sigmoid diverticulitis -improving -advance to full liquids and then bland diet as tolerated -cut down IVF, change Cipro/flagyl to PO -supportive care -H/o colonoscopy last year per [name] reportedly normal  2. H/o CAD -stable, continue statin  3. HTN -BP stable, hold lisinopril, continue norvasc  DVT proph: hep SQ  Code Status: Full Code Family Communication: no family at bedside Disposition Plan: Home tomorrow Antibiotics:  Cipro/Flagyl 3/26  HPI/Subjective: Pain minimal, tolerating liquids, diarrhea x1 yetserday  Objective: Filed Vitals:   07/08/13 0347  BP: 103/65  Pulse: 57  Temp: 97.8 F (36.6 C)  Resp: 20    Intake/Output Summary (Last 24 hours) at 07/08/13 1043 Last data filed at 07/08/13 0849  Gross per 24 hour  Intake   2025 ml  Output   2450 ml  Net   -425 ml   Filed Weights   07/06/13 1334 07/06/13 1940 07/08/13 0347  Weight: 97.07 kg (214 lb) 97.8 kg (215 lb 9.8 oz) 98.612 kg (217 lb 6.4 oz)    Exam:   General: AAOx3, no distress  Cardiovascular: S1S2/RRR  Respiratory: CTAB  Abdomen: soft, obese, mild lower quadrant tenderness, BS present  Musculoskeletal: no edema c/c  Data Reviewed: Basic Metabolic Panel:  Recent Labs Lab 07/06/13 1431 07/07/13 0449  NA 141 144  K 3.9 3.8  CL 103 108  CO2 25 26  GLUCOSE 96 90  BUN 14 10  CREATININE 0.88 0.82  CALCIUM 9.7 8.9   Liver Function Tests:  Recent Labs Lab 07/06/13 1431 07/07/13 0449  AST 21 19  ALT 12 12  ALKPHOS 89 79  BILITOT 0.5 0.4  PROT 7.3 6.1  ALBUMIN 3.5 2.8*    Recent Labs Lab 07/06/13 1431  LIPASE 22   No results found for this basename: AMMONIA,  in the last 168 hours CBC:  Recent Labs Lab 07/06/13 1431 07/07/13 0449 07/08/13 0415  WBC 8.5 5.5 4.0  NEUTROABS 4.9  --   --   HGB 11.5* 10.5*  10.7*  HCT 35.3* 32.0* 32.4*  MCV 93.1 92.2 91.3  PLT 286 263 284   Cardiac Enzymes: No results found for this basename: CKTOTAL, CKMB, CKMBINDEX, TROPONINI,  in the last 168 hours BNP (last 3 results) No results found for this basename: PROBNP,  in the last 8760 hours CBG: No results found for this basename: GLUCAP,  in the last 168 hours  No results found for this or any previous visit (from the past 240 hour(s)).   Studies: Ct Abdomen Pelvis W Contrast  07/06/2013   CLINICAL DATA:  Abdominal pain and nausea. Previous appendectomy and hysterectomy.  EXAM: CT ABDOMEN AND PELVIS WITH CONTRAST  TECHNIQUE: Multidetector CT imaging of the abdomen and pelvis was performed using the standard protocol following bolus administration of intravenous contrast.  CONTRAST:  29mL OMNIPAQUE IOHEXOL 300 MG/ML SOLN, OMNIPAQUE IOHEXOL 300 MG/ML SOLN  COMPARISON:  01/03/2013.  FINDINGS: Moderate-sized sliding hiatal hernia. Multiple calcified splenic granulomata. Cholecystectomy clips. Multiple colonic diverticula. Concentric wall thickening with pericolonic soft tissue stranding involving the distal sigmoid colon in the inferior pelvis. No free peritoneal air or fluid collections. 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  Unremarkable pancreas, adrenal glands, kidneys and visualized portions of the urinary bladder. Surgically absent uterus. Small ovaries. No enlarged lymph nodes. Minimal linear atelectasis or scarring at the lung bases.  Lumbar and lower thoracic spine degenerative changes.  IMPRESSION: 1. Distal sigmoid colon diverticulitis without abscess. Sigmoidoscopic correlation is recommended to exclude an underlying neoplasm. 2. Colonic diverticulosis. 3. Moderate-sized hiatal hernia.   Electronically Signed   By: Jillian PaymentSteve  Evans M.D.   On: 07/06/2013 17:27    Scheduled Meds: . amLODipine  10 mg Oral q morning - 10a  . ciprofloxacin  500 mg Oral BID  .  diclofenac sodium  2 g Topical BID  . gabapentin  100 mg Oral QHS  . heparin  5,000 Units Subcutaneous 3 times per day  . metroNIDAZOLE  250 mg Oral 3 times per day  . multivitamin with minerals  1 tablet Oral Daily  . pantoprazole  40 mg Oral Daily  . simvastatin  20 mg Oral q1800   Continuous Infusions:   Antibiotics Given (last 72 hours)   Date/Time Action Medication Dose Rate   07/06/13 2050 Given   metroNIDAZOLE (FLAGYL) IVPB 500 mg 500 mg 100 mL/hr   07/07/13 0500 Given   metroNIDAZOLE (FLAGYL) IVPB 500 mg 500 mg 100 mL/hr   07/07/13 1038 Given   ciprofloxacin (CIPRO) IVPB 400 mg 400 mg 200 mL/hr   07/07/13 1512 Given   metroNIDAZOLE (FLAGYL) IVPB 500 mg 500 mg 100 mL/hr   07/07/13 2050 Given   metroNIDAZOLE (FLAGYL) IVPB 500 mg 500 mg 100 mL/hr   07/07/13 2050 Given   ciprofloxacin (CIPRO) IVPB 400 mg 400 mg 200 mL/hr   07/08/13 0308 Given   metroNIDAZOLE (FLAGYL) IVPB 500 mg 500 mg 100 mL/hr   07/08/13 0901 Given   ciprofloxacin (CIPRO) IVPB 400 mg 400 mg 200 mL/hr      Active Problems:   HYPOTHYROIDISM   Hypertension   ASCVD (arteriosclerotic cardiovascular disease)   Diverticulitis    Time spent: 35min    [facility]elbackJOSEPH,[name]  [facility] Pager [phone]. If 7PM-7AM, please contact night-coverage at [email or web address], password [email or web address] 07/08/2013, 10:43 AM  LOS: 2 days

## 2013-07-09 MED ORDER — CIPROFLOXACIN HCL 250 MG PO TABS
250.0000 mg | ORAL_TABLET | Freq: Two times a day (BID) | ORAL | Status: DC
Start: 2013-07-09 — End: 2013-08-30

## 2013-07-09 MED ORDER — METRONIDAZOLE 250 MG PO TABS: 250.0000 mg | ORAL_TABLET | Freq: Three times a day (TID) | ORAL | Status: DC

## 2013-07-09 NOTE — Progress Notes (Signed)
IV removed, site WNL.  Pt given d/c instructions, new prescriptions sent to [facility] in [place].  Discussed all home medications (when, how, and why to take), patient verbalizes understanding. Discussed home care with patient, teachback completed. F/U appointment to be made by patient, pt states she will make and keep appointment. Pt is stable at this time. Pt taken to main entrance in wheelchair by staff member.

## 2013-07-09 NOTE — Discharge Summary (Signed)
Physician Discharge Summary  [name] ZOX:[id number] DOB: [birth year] DOA: 07/06/2013  PCP: [name], MD  Admit date: 07/06/2013 Discharge date: 07/09/2013  Time spent: 40 minutes  Recommendations for Outpatient Follow-up:  1. PCP Dr.Hill in 1 week  Discharge Diagnoses:    Acute Diverticulitis   CAD   HYPOTHYROIDISM   Hypertension   ASCVD (arteriosclerotic cardiovascular disease)   Discharge Condition: stable  Diet recommendation: bland diet advance as tolerated  Filed Weights   07/06/13 1940 07/08/13 0347 07/09/13 8119  Weight: 97.8 kg (215 lb 9.8 oz) 98.612 kg (217 lb 6.4 oz) 98.567 kg (217 lb 4.8 oz)    History of present illness:  [name] is a 73 y.o. female  This is a very pleasant 73 year old lady who presents with a 2 day history of mostly lower abdominal pain associated with nausea. She initially had loose stools and possibly one episode of rectal bleeding but she has not opened her bowels in the last 24 hours. There has been no vomiting. She has been feeling feverish subjectively. She does have a history of diverticular disease. Evaluation in the emergency room is consistent with diagnosis of diverticulitis. She is now being admitted for further treatment.   Hospital Course:  . Acute sigmoid diverticulitis  -improved -treated with Bowel rest, IV Cipro/FLagyl, IVF, diet gradually advanced -changed Cipro/flagyl to PO at discharge for 6 more days -H/o colonoscopy last year per Dr.Mann reportedly normal   2. H/o CAD  -stable, continue statin   3. HTN  -BP stable, continue lisinopril, norvasc   Discharge Exam: Filed Vitals:   07/09/13 0758  BP: 134/58  Pulse: 63  Temp:   Resp:     General: AAOx3 Cardiovascular: S1S2/RRR Respiratory:CTAB  Discharge Instructions  Discharge Orders   Future Orders Complete By Expires   Discharge instructions  As directed    Comments:     Bland diet for next few days then Advance to regular low sodium  diet   Increase activity slowly  As directed        Medication List         amLODipine 10 MG tablet  Commonly known as:  NORVASC  Take 10 mg by mouth every morning.     aspirin 81 MG tablet  Take 81 mg by mouth every morning.     CALCIUM 600 + D PO  Take 1 tablet by mouth 2 (two) times daily.     ciprofloxacin 250 MG tablet  Commonly known as:  CIPRO  Take 1 tablet (250 mg total) by mouth 2 (two) times daily. For 6 more days     furosemide 40 MG tablet  Commonly known as:  LASIX  Take 40 mg by mouth daily.     gabapentin 100 MG capsule  Commonly known as:  NEURONTIN  Take 100 mg by mouth at bedtime.     HYDROcodone-acetaminophen 10-325 MG per tablet  Commonly known as:  NORCO  Take 1 tablet by mouth 2 (two) times daily as needed for pain.     lisinopril 20 MG tablet  Commonly known as:  PRINIVIL,ZESTRIL  Take 20 mg by mouth 2 (two) times daily.     metroNIDAZOLE 250 MG tablet  Commonly known as:  FLAGYL  Take 1 tablet (250 mg total) by mouth every 8 (eight) hours. For 6 days     multivitamin with minerals Tabs tablet  Take 1 tablet by mouth daily.     nitroGLYCERIN 0.4 MG SL tablet  Commonly known as:  NITROSTAT  Place 0.4 mg under the tongue every 5 (five) minutes as needed. For chest pain     omeprazole 20 MG capsule  Commonly known as:  PRILOSEC  Take 40 mg by mouth 2 (two) times daily.     pravastatin 40 MG tablet  Commonly known as:  PRAVACHOL  Take 40 mg by mouth at bedtime.     VOLTAREN 1 % Gel  Generic drug:  diclofenac sodium  Apply 2 g topically 2 (two) times daily.       Allergies  Allergen Reactions  . Codeine Itching    "feel like going buggy"  Itchy skin       Follow-up Information   Follow up with HILL,GERALD K, MD. Schedule an appointment as soon as possible for a visit in 1 week.   Specialty:  Family Medicine   Contact information:   [address] [place] [address] [phone]        The results of  significant diagnostics from this hospitalization (including imaging, microbiology, ancillary and laboratory) are listed below for reference.    Significant Diagnostic Studies: Ct Abdomen Pelvis W Contrast  07/06/2013   CLINICAL DATA:  Abdominal pain and nausea. Previous appendectomy and hysterectomy.  EXAM: CT ABDOMEN AND PELVIS WITH CONTRAST  TECHNIQUE: Multidetector CT imaging of the abdomen and pelvis was performed using the standard protocol following bolus administration of intravenous contrast.  CONTRAST:  50mL OMNIPAQUE IOHEXOL 300 MG/ML SOLN, 100mL OMNIPAQUE IOHEXOL 300 MG/ML SOLN  COMPARISON:  01/03/2013.  FINDINGS: Moderate-sized sliding hiatal hernia. Multiple calcified splenic granulomata. Cholecystectomy clips. Multiple colonic diverticula. Concentric wall thickening with pericolonic soft tissue stranding involving the distal sigmoid colon in the inferior pelvis. No free peritoneal air or fluid collections. 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  Unremarkable pancreas, adrenal glands, kidneys and visualized portions of the urinary bladder. Surgically absent uterus. Small ovaries. No enlarged lymph nodes. Minimal linear atelectasis or scarring at the lung bases. Lumbar and lower thoracic spine degenerative changes.  IMPRESSION: 1. Distal sigmoid colon diverticulitis without abscess. Sigmoidoscopic correlation is recommended to exclude an underlying neoplasm. 2. Colonic diverticulosis. 3. Moderate-sized hiatal hernia.   Electronically Signed   By: Gordan PaymentSteve  Reid M.D.   On: 07/06/2013 17:27    Microbiology: No results found for this or any previous visit (from the past 240 hour(s)).   Labs: Basic Metabolic Panel:  Recent Labs Lab 07/06/13 1431 07/07/13 0449  NA 141 144  K 3.9 3.8  CL 103 108  CO2 25 26  GLUCOSE 96 90  BUN 14 10  CREATININE 0.88 0.82  CALCIUM 9.7 8.9   Liver Function Tests:  Recent Labs Lab 07/06/13 1431  07/07/13 0449  AST 21 19  ALT 12 12  ALKPHOS 89 79  BILITOT 0.5 0.4  PROT 7.3 6.1  ALBUMIN 3.5 2.8*    Recent Labs Lab 07/06/13 1431  LIPASE 22   No results found for this basename: AMMONIA,  in the last 168 hours CBC:  Recent Labs Lab 07/06/13 1431 07/07/13 0449 07/08/13 0415  WBC 8.5 5.5 4.0  NEUTROABS 4.9  --   --   HGB 11.5* 10.5* 10.7*  HCT 35.3* 32.0* 32.4*  MCV 93.1 92.2 91.3  PLT 286 263 284   Cardiac Enzymes: No results found for this basename: CKTOTAL, CKMB, CKMBINDEX, TROPONINI,  in the last 168 hours BNP: BNP (last 3 results) No results found for this  basename: PROBNP,  in the last 8760 hours CBG: No results found for this basename: GLUCAP,  in the last 168 hours     Signed:  Alexus Michael  [facility] 07/09/2013, 3:57 PM

## 2013-08-29 ENCOUNTER — Encounter: Payer: Self-pay | Admitting: Cardiology

## 2013-08-30 ENCOUNTER — Encounter: Payer: Self-pay | Admitting: Cardiology

## 2013-08-30 ENCOUNTER — Ambulatory Visit (INDEPENDENT_AMBULATORY_CARE_PROVIDER_SITE_OTHER): Payer: Medicare Other | Admitting: Cardiology

## 2013-08-30 VITALS — BP 140/74 | HR 61 | Ht 63.0 in | Wt 209.0 lb

## 2013-08-30 DIAGNOSIS — I429 Cardiomyopathy, unspecified: Secondary | ICD-10-CM

## 2013-08-30 DIAGNOSIS — I428 Other cardiomyopathies: Secondary | ICD-10-CM

## 2013-08-30 DIAGNOSIS — I10 Essential (primary) hypertension: Secondary | ICD-10-CM

## 2013-08-30 DIAGNOSIS — E785 Hyperlipidemia, unspecified: Secondary | ICD-10-CM

## 2013-08-30 DIAGNOSIS — I2510 Atherosclerotic heart disease of native coronary artery without angina pectoris: Secondary | ICD-10-CM

## 2013-08-30 NOTE — Assessment & Plan Note (Signed)
LVEF reportedly 40-45% as of 2011, although overall "normal" by Cardiolite study that year. Plan is to obtain a followup echocardiogram and continue medical therapy. She is not reporting any progressive shortness of breath.

## 2013-08-30 NOTE — Assessment & Plan Note (Signed)
No change to current regimen. 

## 2013-08-30 NOTE — Progress Notes (Signed)
Clinical Summary [name] is a 73 y.o.female last seen by Dr. Dietrich Pates back in June 2014, presenting today for a routine visit. Record review finds hospitalization in March of this year due to diverticulitis. She otherwise reports no angina symptoms, NYHA class II dyspnea. She has chronic mild leg edema, left worse than right, she attributes a lot of this to having had her left hip replaced 6 years ago. She does not endorse any orthopnea or PND.  Lab work from March showed hemoglobin 10.7, platelets 284, potassium 3.8, BUN 10, creatinine 0.8, normal AST and ALT. ECG today shows sinus rhythm with left bundle branch block.  Last ischemic workup was via Cardiolite in 2011 showing no clear evidence of scar or ischemia, abnormal septal motion attributed to left bundle branch block. Echocardiogram in 2011 reported LVEF 40-45% with abnormal septal motion, mild left atrial enlargement, sclerotic aortic valve, PASP 31 mm mercury.   Allergies  Allergen Reactions  . Codeine Itching    "feel like going buggy"  Itchy skin    Current Outpatient Prescriptions  Medication Sig Dispense Refill  . amLODipine (NORVASC) 10 MG tablet Take 10 mg by mouth every morning.       Marland Kitchen aspirin 81 MG tablet Take 81 mg by mouth every morning.       . Calcium Carbonate-Vitamin D (CALCIUM 600 + D PO) Take 1 tablet by mouth 2 (two) times daily.      . furosemide (LASIX) 40 MG tablet Take 40 mg by mouth daily.       Marland Kitchen gabapentin (NEURONTIN) 100 MG capsule Take 100 mg by mouth at bedtime.      Marland Kitchen HYDROcodone-acetaminophen (NORCO) 10-325 MG per tablet Take 1 tablet by mouth 2 (two) times daily as needed for pain.       Marland Kitchen lisinopril (PRINIVIL,ZESTRIL) 20 MG tablet Take 20 mg by mouth 2 (two) times daily.       . Multiple Vitamin (MULTIVITAMIN WITH MINERALS) TABS Take 1 tablet by mouth daily.      . nitroGLYCERIN (NITROSTAT) 0.4 MG SL tablet Place 0.4 mg under the tongue every 5 (five) minutes as needed. For chest pain         . omeprazole (PRILOSEC) 20 MG capsule Take 40 mg by mouth 2 (two) times daily.       . pravastatin (PRAVACHOL) 40 MG tablet Take 40 mg by mouth at bedtime.       . VOLTAREN 1 % GEL Apply 2 g topically 2 (two) times daily.        No current facility-administered medications for this visit.    Past Medical History  Diagnosis Date  . Essential hypertension, benign   . Coronary atherosclerosis of native coronary artery     60% LAD in 2002  . Hyperlipidemia   . Left bundle branch block   . Hypothyroidism   . Obesity   . Anemia   . Chronic insomnia   . DJD (degenerative joint disease)     Left THA  . GERD (gastroesophageal reflux disease)   . Nephrolithiasis   . Cardiomyopathy     LVEF 40-45% 2011    Social History [name] reports that she has never smoked. She does not have any smokeless tobacco history on file. [name] reports that she does not drink alcohol.  Review of Systems No palpitations, dizziness, syncope. Chronic left hip pain. Still functional and her ADLs, including indoor and outdoor chores. Otherwise negative.  Physical Examination Filed  Vitals:   08/30/13 1431  BP: 140/74  Pulse: 61   Filed Weights   08/30/13 1431  Weight: 209 lb (94.802 kg)   Overweight woman, appears comfortable at rest. HEENT: Conjunctiva and lids normal, oropharynx clear. Neck: Supple, no elevated JVP or carotid bruits, no thyromegaly. Lungs: Clear to auscultation, nonlabored breathing at rest. Cardiac: Regular rate and rhythm, paradoxically split S2, no S3 or significant systolic murmur, no pericardial rub. Abdomen: Soft, nontender, bowel sounds present. Extremities: Trace edema, distal pulses 2+. Skin: Warm and dry. Musculoskeletal: No kyphosis. Neuropsychiatric: Alert and oriented x3, affect grossly appropriate.   Problem List and Plan   Coronary atherosclerosis of native coronary artery History of nonobstructive disease as outlined, negative ischemic workup in  2011. Has chronic left bundle branch block by ECG, does not endorse any angina symptoms. Continue medical therapy and observation.  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 Plan is to obtain a followup echocardiogram and continue medical therapy. She is not reporting any progressive shortness of breath.  Essential hypertension, benign No change to current regimen.  Hyperlipidemia Continues on Pravachol, followed by Dr. Loleta ChanceHill.    Jonelle SidleSamuel G. McDowell, M.D., F.A.C.C.

## 2013-08-30 NOTE — Patient Instructions (Signed)
Your physician wants you to follow-up in: 1 year You will receive a reminder letter in the mail two months in advance. If you don't receive a letter, please call our office to schedule the follow-up appointment.     Your physician recommends that you continue on your current medications as directed. Please refer to the Current Medication list given to you today.     Your physician has requested that you have an echocardiogram. Echocardiography is a painless test that uses sound waves to create images of your heart. It provides your doctor with information about the size and shape of your heart and how well your heart's chambers and valves are working. This procedure takes approximately one hour. There are no restrictions for this procedure.      Thank you for choosing [facility] !        

## 2013-08-30 NOTE — Assessment & Plan Note (Signed)
History of nonobstructive disease as outlined, negative ischemic workup in 2011. Has chronic left bundle branch block by ECG, does not endorse any angina symptoms. Continue medical therapy and observation.

## 2013-08-30 NOTE — Assessment & Plan Note (Signed)
Continues on Pravachol, followed by Dr. [name].

## 2013-09-12 ENCOUNTER — Ambulatory Visit (HOSPITAL_COMMUNITY)
Admission: RE | Admit: 2013-09-12 | Discharge: 2013-09-12 | Disposition: A | Discharge status: Home / Self Care | Payer: Medicare Other | Source: Ambulatory Visit | Attending: Cardiology | Admitting: Cardiology

## 2013-09-12 DIAGNOSIS — I429 Cardiomyopathy, unspecified: Secondary | ICD-10-CM

## 2013-09-12 DIAGNOSIS — E785 Hyperlipidemia, unspecified: Secondary | ICD-10-CM | POA: Insufficient documentation

## 2013-09-12 DIAGNOSIS — Z79899 Other long term (current) drug therapy: Secondary | ICD-10-CM | POA: Insufficient documentation

## 2013-09-12 DIAGNOSIS — I059 Rheumatic mitral valve disease, unspecified: Secondary | ICD-10-CM

## 2013-09-12 DIAGNOSIS — I428 Other cardiomyopathies: Secondary | ICD-10-CM | POA: Insufficient documentation

## 2013-09-12 DIAGNOSIS — I10 Essential (primary) hypertension: Secondary | ICD-10-CM | POA: Insufficient documentation

## 2013-09-12 DIAGNOSIS — I2510 Atherosclerotic heart disease of native coronary artery without angina pectoris: Secondary | ICD-10-CM

## 2013-09-12 NOTE — Progress Notes (Signed)
  Echocardiogram 2D Echocardiogram has been performed.  [name] 09/12/2013, 10:47 AM

## 2013-09-24 ENCOUNTER — Encounter: Payer: Self-pay | Admitting: Internal Medicine

## 2013-10-02 ENCOUNTER — Other Ambulatory Visit (HOSPITAL_COMMUNITY): Payer: Self-pay | Admitting: Family Medicine

## 2013-10-02 DIAGNOSIS — Z1231 Encounter for screening mammogram for malignant neoplasm of breast: Secondary | ICD-10-CM

## 2013-11-13 ENCOUNTER — Ambulatory Visit (HOSPITAL_COMMUNITY)
Admission: RE | Admit: 2013-11-13 | Discharge: 2013-11-13 | Disposition: A | Discharge status: Home / Self Care | Payer: Medicare Other | Source: Ambulatory Visit | Attending: Family Medicine | Admitting: Family Medicine

## 2013-11-13 DIAGNOSIS — Z1231 Encounter for screening mammogram for malignant neoplasm of breast: Secondary | ICD-10-CM | POA: Insufficient documentation

## 2013-12-01 ENCOUNTER — Encounter (HOSPITAL_COMMUNITY): Payer: Self-pay | Admitting: Emergency Medicine

## 2013-12-01 ENCOUNTER — Emergency Department (HOSPITAL_COMMUNITY)
Admission: EM | Admit: 2013-12-01 | Discharge: 2013-12-01 | Disposition: A | Discharge status: Home / Self Care | Payer: Medicare Other | Attending: Emergency Medicine | Admitting: Emergency Medicine

## 2013-12-01 DIAGNOSIS — I447 Left bundle-branch block, unspecified: Secondary | ICD-10-CM | POA: Diagnosis not present

## 2013-12-01 DIAGNOSIS — Z8739 Personal history of other diseases of the musculoskeletal system and connective tissue: Secondary | ICD-10-CM | POA: Insufficient documentation

## 2013-12-01 DIAGNOSIS — T4995XA Adverse effect of unspecified topical agent, initial encounter: Secondary | ICD-10-CM | POA: Diagnosis not present

## 2013-12-01 DIAGNOSIS — I2510 Atherosclerotic heart disease of native coronary artery without angina pectoris: Secondary | ICD-10-CM | POA: Diagnosis not present

## 2013-12-01 DIAGNOSIS — E785 Hyperlipidemia, unspecified: Secondary | ICD-10-CM | POA: Diagnosis not present

## 2013-12-01 DIAGNOSIS — Z87442 Personal history of urinary calculi: Secondary | ICD-10-CM | POA: Diagnosis not present

## 2013-12-01 DIAGNOSIS — I10 Essential (primary) hypertension: Secondary | ICD-10-CM | POA: Insufficient documentation

## 2013-12-01 DIAGNOSIS — T783XXA Angioneurotic edema, initial encounter: Secondary | ICD-10-CM | POA: Diagnosis not present

## 2013-12-01 DIAGNOSIS — Z79899 Other long term (current) drug therapy: Secondary | ICD-10-CM | POA: Insufficient documentation

## 2013-12-01 DIAGNOSIS — K219 Gastro-esophageal reflux disease without esophagitis: Secondary | ICD-10-CM | POA: Diagnosis not present

## 2013-12-01 DIAGNOSIS — Z862 Personal history of diseases of the blood and blood-forming organs and certain disorders involving the immune mechanism: Secondary | ICD-10-CM | POA: Insufficient documentation

## 2013-12-01 DIAGNOSIS — Z7982 Long term (current) use of aspirin: Secondary | ICD-10-CM | POA: Insufficient documentation

## 2013-12-01 DIAGNOSIS — R21 Rash and other nonspecific skin eruption: Secondary | ICD-10-CM | POA: Insufficient documentation

## 2013-12-01 DIAGNOSIS — Z9861 Coronary angioplasty status: Secondary | ICD-10-CM | POA: Diagnosis not present

## 2013-12-01 DIAGNOSIS — T464X5A Adverse effect of angiotensin-converting-enzyme inhibitors, initial encounter: Secondary | ICD-10-CM

## 2013-12-01 DIAGNOSIS — T7840XA Allergy, unspecified, initial encounter: Secondary | ICD-10-CM

## 2013-12-01 MED ORDER — FAMOTIDINE 20 MG PO TABS
20.0000 mg | ORAL_TABLET | Freq: Once | ORAL | Status: AC
  Administered 2013-12-01: 20 mg via ORAL
  Filled 2013-12-01: qty 1

## 2013-12-01 MED ORDER — DIPHENHYDRAMINE HCL 25 MG PO CAPS
25.0000 mg | ORAL_CAPSULE | Freq: Once | ORAL | Status: AC
  Administered 2013-12-01: 25 mg via ORAL
  Filled 2013-12-01: qty 1

## 2013-12-01 MED ORDER — HYDRALAZINE HCL 25 MG PO TABS: 25.0000 mg | ORAL_TABLET | Freq: Three times a day (TID) | ORAL | Status: DC

## 2013-12-01 MED ORDER — PREDNISONE 50 MG PO TABS
60.0000 mg | ORAL_TABLET | Freq: Once | ORAL | Status: AC
  Administered 2013-12-01: 60 mg via ORAL
  Filled 2013-12-01 (×2): qty 1

## 2013-12-01 MED ORDER — PREDNISONE 20 MG PO TABS: ORAL_TABLET | ORAL | Status: DC

## 2013-12-01 MED ORDER — HYDROXYZINE HCL 25 MG PO TABS
50.0000 mg | ORAL_TABLET | Freq: Once | ORAL | Status: AC
  Administered 2013-12-01: 50 mg via ORAL
  Filled 2013-12-01: qty 2

## 2013-12-01 MED ORDER — HYDROXYZINE HCL 25 MG PO TABS: ORAL_TABLET | ORAL | Status: DC

## 2013-12-01 MED ORDER — FAMOTIDINE 20 MG PO TABS: 20.0000 mg | ORAL_TABLET | Freq: Two times a day (BID) | ORAL | Status: DC

## 2013-12-01 NOTE — Discharge Instructions (Signed)
STOP THE LISINOPRIL, IT IS WHAT MADE YOUR LIP SWELL!   Take the prednisone, pepcid and hydroxyzine as prescribed. Start the hydralazine for your blood pressure. Stay cool, heat will make your rash and itching worse. Keep your appointment with Dr [name] next week. Return to the ED if you have trouble breathing, are unable to swallow or get more swelling of your lips or in your mouth.  Angioedema Angioedema is sudden puffiness (swelling), often of the skin. It can happen:  On your face or privates (genitals).  In your belly (abdomen) or other body parts. It usually happens quickly and gets better in 1 or 2 days. It often starts at night and is found when you wake up. You may get red, itchy patches of skin (hives). Attacks can be dangerous if your breathing passages get puffy. The condition may happen only once, or it can come back at random times. It may happen for several years before it goes away for good. HOME CARE  Only take medicines as told by your doctor.  Always carry your emergency allergy medicines with you.  Wear a medical bracelet as told by your doctor.  Avoid things that you know will cause attacks (triggers). GET HELP IF:  You have another attack.  Your attacks happen more often or get worse.  The condition was passed to you by your parents and you want to have children. GET HELP RIGHT AWAY IF:   Your mouth, tongue, or lips are very puffy.  You have trouble breathing.  You have trouble swallowing.  You pass out (faint). MAKE SURE YOU:   Understand these instructions.  Will watch your condition.  Will get help right away if you are not doing well or get worse. Document Released: 03/18/2009 Document Revised: 01/18/2013 Document Reviewed: 11/21/2012 [facility] Patient Information 2015 [place], [place]. This information is not intended to replace advice given to you by your health care provider. Make sure you discuss any questions you have with your health care  provider.  Hives Hives are itchy, red, puffy (swollen) areas of the skin. Hives can change in size and location on your body. Hives can come and go for hours, days, or weeks. Hives do not spread from person to person (noncontagious). Scratching, exercise, and stress can make your hives worse. HOME CARE  Avoid things that cause your hives (triggers).  Take antihistamine medicines as told by your doctor. Do not drive while taking an antihistamine.  Take any other medicines for itching as told by your doctor.  Wear loose-fitting clothing.  Keep all doctor visits as told. GET HELP RIGHT AWAY IF:   You have a fever.  Your tongue or lips are puffy.  You have trouble breathing or swallowing.  You feel tightness in the throat or chest.  You have belly (abdominal) pain.  You have lasting or severe itching that is not helped by medicine.  You have painful or puffy joints. These problems may be the first sign of a life-threatening allergic reaction. Call your local emergency services (911 in [place] MAKE SURE YOU:   Understand these instructions.  Will watch your condition.  Will get help right away if you are not doing well or get worse. Document Released: 01/07/2008 Document Revised: 09/29/2011 Document Reviewed: 06/23/2011 [facility] Patient Information 2015 [place], [place]. This information is not intended to replace advice given to you by your health care provider. Make sure you discuss any questions you have with your health care provider.

## 2013-12-01 NOTE — ED Notes (Addendum)
Pt states she has had a rash of unknown origin since Wednesday, pt denies difficulty swallowing. Th ept states she has a rash breaking out all over her body, especially if she gets hot. The pt states she has some facial swelling at times but no swelling in her throat or mouth. Pt does take lisinopril, has been taking it for two years. The pt states her top lip is sore and swollen, pt also states she has had a sore throat since wed.

## 2013-12-01 NOTE — ED Provider Notes (Signed)
CSN: [id number]     Arrival date & time 12/01/13  1239 History  This chart was scribed for Ward GivensIva L Nikolos Billig, MD by Karle PlumberJennifer Tensley, ED Scribe. This patient was seen in room APA07/APA07 and the patient's care was started at 1:14 PM.  Chief Complaint  Patient presents with  . Allergic Reaction   HPI HPI Comments:  [name] is a 73 y.o.  female who presents to the Emergency Department complaining of rash onset two days ago. She states upon waking two days ago she had bilateral hand and arm itching. She states she tried washing her hands with soap and water with no relief. She states the rash has since spread to her upper arms, abdomen, groin, neck, scalp line and ears. She reports associated sore throat and lip swelling that started this morning. She reports pain with swallowing solilds is normal at baseline but states now she is having pain with swallowing liquids as well. She denies any new soaps, creams, lotions, deodorants or detergents. She states she lives alone with her dog and has not changed any products she uses with it. She states she ate a corn bread muffin on Tuesday and has not eaten one in years. She states in the past she couldn't eat corn meal because she would have difficulty swallowing it. She states she saw her PCP, Dr. Loleta ChanceHill, yesterday and was prescribed Prednisone in which she has taken only one dose yesterday and one pill this morning and was advised to take Benadryl with her last dose being approximately two hours ago. She denies tongue swelling, SOB, fever, chest pain. She states getting hot makes her itch more, cooling off makes the itching better. This has never happened before. She has been on lisinopril for many years.    PCP Dr Loleta ChanceHill  Past Medical History  Diagnosis Date  . Essential hypertension, benign   . Coronary atherosclerosis of native coronary artery     60% LAD in 2002  . Hyperlipidemia   . Left bundle branch block   . Hypothyroidism   . Obesity   . Anemia    . Chronic insomnia   . DJD (degenerative joint disease)     Left THA  . GERD (gastroesophageal reflux disease)   . Nephrolithiasis   . Cardiomyopathy     LVEF 40-45% 2011   Past Surgical History  Procedure Laterality Date  . Revision total hip arthroplasty  2009    Left THA 2009  . Appendectomy    . Abdominal hysterectomy      (partial)  . Nose surgery    . Hemorroidectomy    . Cholecystectomy    . Colonoscopy  Aug 2013    [facility]: small internal hemorrhoids and few scattered sigmoid diverticula, otherwise normal up to TI, TI appeared normal  . Esophagogastroduodenoscopy N/A 06/01/2012    RMR:5 cm hiatal hernia. Essentially normal esophagus  . Joint replacement     Family History  Problem Relation Age of Onset  . Colon cancer Neg Hx    History  Substance Use Topics  . Smoking status: Never Smoker   . Smokeless tobacco: Not on file  . Alcohol Use: No   Lives at home Lives alone  OB History   Grav Para Term Preterm Abortions TAB SAB Ect Mult Living                 Review of Systems  Constitutional: Negative for fever.  HENT: Positive for facial swelling  and sore throat. Negative for trouble swallowing.   Respiratory: Negative for shortness of breath.   Skin: Positive for rash.  All other systems reviewed and are negative.   Allergies  Codeine  Home Medications   Prior to Admission medications   Medication Sig Start Date End Date Taking? Authorizing Provider  amLODipine (NORVASC) 10 MG tablet Take 10 mg by mouth every morning.    Yes Historical Provider, MD  aspirin 81 MG tablet Take 81 mg by mouth every morning.    Yes Historical Provider, MD  Calcium Carbonate-Vitamin D (CALCIUM 600 + D PO) Take 1 tablet by mouth 2 (two) times daily.   Yes Historical Provider, MD  furosemide (LASIX) 40 MG tablet Take 40 mg by mouth daily.    Yes Historical Provider, MD  gabapentin (NEURONTIN) 100 MG capsule Take 100 mg by mouth at bedtime.    Yes Historical Provider, MD  HYDROcodone-acetaminophen (NORCO) 10-325 MG per tablet Take 1 tablet by mouth 2 (two) times daily as needed for pain.  04/27/12  Yes Historical Provider, MD  lisinopril (PRINIVIL,ZESTRIL) 20 MG tablet Take 20 mg by mouth 2 (two) times daily.    Yes Historical Provider, MD  Multiple Vitamin (MULTIVITAMIN WITH MINERALS) TABS Take 1 tablet by mouth daily.   Yes Historical Provider, MD  nitroGLYCERIN (NITROSTAT) 0.4 MG SL tablet Place 0.4 mg under the tongue every 5 (five) minutes as needed. For chest pain    Yes Historical Provider, MD  omeprazole (PRILOSEC) 20 MG capsule Take 40 mg by mouth 2 (two) times daily.    Yes Historical Provider, MD  pravastatin (PRAVACHOL) 40 MG tablet Take 40 mg by mouth at bedtime.    Yes Historical Provider, MD  VOLTAREN 1 % GEL Apply 2 g topically 2 (two) times daily.  12/13/12  Yes Historical Provider, MD   Triage Vitals: BP 139/66  Pulse 67  Temp(Src) 98.1 F (36.7 C) (Oral)  Resp 18  Ht 5\' 3"  (1.6 m)  Wt 208 lb (94.348 kg)  BMI 36.85 kg/m2  SpO2 99%  Vital signs normal   Physical Exam  Nursing note and vitals reviewed. Constitutional: She is oriented to person, place, and time. She appears well-developed and well-nourished.  Non-toxic appearance. She does not appear ill. No distress.  HENT:  Head: Normocephalic and atraumatic.  Right Ear: External ear normal.  Left Ear: External ear normal.  Nose: Nose normal. No mucosal edema or rhinorrhea.  Mouth/Throat: Oropharynx is clear and moist and mucous membranes are normal. No dental abscesses or uvula swelling.  Patch of redness on left soft pallet. Mild swelling to upper lip. See photo.  Eyes: Conjunctivae and EOM are normal. Pupils are equal, round, and reactive to light.  Neck: Normal range of motion and full passive range of motion without pain. Neck supple.  Cardiovascular: Normal rate, regular rhythm and normal heart sounds.  Exam reveals no gallop and no friction rub.   No  murmur heard. Pulmonary/Chest: Effort normal and breath sounds normal. No respiratory distress. She has no wheezes. She has no rhonchi. She has no rales. She exhibits no tenderness and no crepitus.  Abdominal: Soft. Normal appearance and bowel sounds are normal. She exhibits no distension. There is no tenderness. There is no rebound and no guarding.  Musculoskeletal: Normal range of motion. She exhibits no edema and no tenderness.  Moves all extremities well.   Neurological: She is alert and oriented to person, place, and time. She has normal strength. No cranial nerve  deficit.  Skin: Skin is warm, dry and intact. Rash noted. No erythema. No pallor.  Scattered dime size urticarial type lesions around bra line, upper inner arms, groin, scattered on abdomen and back. See photo.  Psychiatric: She has a normal mood and affect. Her speech is normal and behavior is normal. Her mood appears not anxious.        ED Course  Procedures (including critical care time)  Medications  famotidine (PEPCID) tablet 20 mg (20 mg Oral Given 12/01/13 1427)  predniSONE (DELTASONE) tablet 60 mg (60 mg Oral Given 12/01/13 1427)  hydrOXYzine (ATARAX/VISTARIL) tablet 50 mg (50 mg Oral Given 12/01/13 1427)  diphenhydrAMINE (BENADRYL) capsule 25 mg (25 mg Oral Given 12/01/13 1549)    DIAGNOSTIC STUDIES: Oxygen Saturation is 99% on RA, normal by my interpretation.   COORDINATION OF CARE: 2:11 PM- Will order Pepcid, Prednisone and Hydroxyzine. Pt verbalizes understanding and agrees to plan.  15:40 pt c/o still itching, given oral benadryl.  At time of discharge her rash was faded and barely visible. She states her upper lips feels softer and less tight. She feels ready to be discharged. We discussed her lisinopril could be the etiology of her lip swelling. She was advised to stop the lisinopril. She was started on hydralazine. She is started on maximum dose of Norvasc. She also has an appointment to see her PCP again  in 6 days. He can recheck her blood pressure at that time.   Labs Review   Imaging Review No results found.   EKG Interpretation None      MDM   Final diagnoses:  Allergic reaction, initial encounter  ACE inhibitor-aggravated angioedema, initial encounter     New Prescriptions   FAMOTIDINE (PEPCID) 20 MG TABLET    Take 1 tablet (20 mg total) by mouth 2 (two) times daily.   HYDRALAZINE (APRESOLINE) 25 MG TABLET    Take 1 tablet (25 mg total) by mouth 3 (three) times daily.   HYDROXYZINE (ATARAX/VISTARIL) 25 MG TABLET    Take 1 or 2 po QID prn itching or swelling   PREDNISONE (DELTASONE) 20 MG TABLET    Take 3 po QD x 2d starting tomorrow, then 2 po QD x 3d then 1 po QD x 3d    Plan discharge  Devoria Albe, MD, FACEP   I personally performed the services described in this documentation, which was scribed in my presence. The recorded information has been reviewed and considered.  Devoria Albe, MD, FACEP    Ward Givens, MD 12/01/13 [phone]

## 2014-08-30 ENCOUNTER — Encounter: Payer: Self-pay | Admitting: Cardiology

## 2014-08-30 ENCOUNTER — Ambulatory Visit (INDEPENDENT_AMBULATORY_CARE_PROVIDER_SITE_OTHER): Payer: Medicare Other | Admitting: Cardiology

## 2014-08-30 VITALS — BP 140/78 | HR 76 | Ht 63.5 in | Wt 219.0 lb

## 2014-08-30 DIAGNOSIS — I2510 Atherosclerotic heart disease of native coronary artery without angina pectoris: Secondary | ICD-10-CM

## 2014-08-30 DIAGNOSIS — I429 Cardiomyopathy, unspecified: Secondary | ICD-10-CM | POA: Diagnosis not present

## 2014-08-30 DIAGNOSIS — Z136 Encounter for screening for cardiovascular disorders: Secondary | ICD-10-CM

## 2014-08-30 DIAGNOSIS — I10 Essential (primary) hypertension: Secondary | ICD-10-CM | POA: Diagnosis not present

## 2014-08-30 NOTE — Progress Notes (Signed)
Cardiology Office Note  Date: 08/30/2014   ID: [name], DOB [birth year], MRN [id number]  PCP: [name], MD  Primary Cardiologist: [name], MD   Chief Complaint  Patient presents with  . Coronary Artery Disease  . Cardiomyopathy    History of Present Illness: [name] is a 74 y.o. female last seen in May 2015. She presents for a routine follow-up visit. Since I last saw her, she does not report any significant angina symptoms, has had no palpitations or syncope. We arranged a follow-up echocardiogram last year showing overall improved LVEF.  ECG today shows sinus rhythm with old left bundle branch block.  We reviewed her medications, she reports compliance.  She states that she remains functional with her ADLs including cooking and cleaning. Her elderly sister is now living with her, has dementia, there are stressors associated with this but she states that she is handling things okay.   Past Medical History  Diagnosis Date  . Essential hypertension, benign   . Coronary atherosclerosis of native coronary artery     60% LAD in 2002  . Hyperlipidemia   . Left bundle branch block   . Hypothyroidism   . Obesity   . Anemia   . Chronic insomnia   . DJD (degenerative joint disease)     Left THA  . GERD (gastroesophageal reflux disease)   . Nephrolithiasis   . Cardiomyopathy     LVEF 40-45% 2011     Current Outpatient Prescriptions  Medication Sig Dispense Refill  . amLODipine (NORVASC) 10 MG tablet Take 10 mg by mouth every morning.     Marland Kitchen aspirin 81 MG tablet Take 81 mg by mouth every morning.     . Calcium Carbonate-Vitamin D (CALCIUM 600 + D PO) Take 1 tablet by mouth 2 (two) times daily.    . famotidine (PEPCID) 20 MG tablet Take 1 tablet (20 mg total) by mouth 2 (two) times daily. 20 tablet 0  . furosemide (LASIX) 40 MG tablet Take 40 mg by mouth daily.     Marland Kitchen gabapentin (NEURONTIN) 100 MG capsule Take 100 mg by mouth at bedtime.    Marland Kitchen  HYDROcodone-acetaminophen (NORCO) 10-325 MG per tablet Take 1 tablet by mouth 2 (two) times daily as needed for pain.     Marland Kitchen lisinopril (PRINIVIL,ZESTRIL) 20 MG tablet Take 20 mg by mouth 2 (two) times daily.     . Multiple Vitamin (MULTIVITAMIN WITH MINERALS) TABS Take 1 tablet by mouth daily.    . nitroGLYCERIN (NITROSTAT) 0.4 MG SL tablet Place 0.4 mg under the tongue every 5 (five) minutes as needed. For chest pain     . omeprazole (PRILOSEC) 20 MG capsule Take 40 mg by mouth 2 (two) times daily.     . pravastatin (PRAVACHOL) 40 MG tablet Take 40 mg by mouth at bedtime.      No current facility-administered medications for this visit.    Allergies:  Codeine   Social History: The patient  reports that she has never smoked. She does not have any smokeless tobacco history on file. She reports that she does not drink alcohol or use illicit drugs.   ROS:  Please see the history of present illness. Otherwise, complete review of systems is positive for none.  All other systems are reviewed and negative.   Physical Exam: VS:  BP 140/78 mmHg  Pulse 76  Ht 5' 3.5" (1.613 m)  Wt 219 lb (99.338 kg)  BMI 38.18 kg/m2  SpO2 97%, BMI Body mass index is 38.18 kg/(m^2).  Wt Readings from Last 3 Encounters:  08/30/14 219 lb (99.338 kg)  12/01/13 208 lb (94.348 kg)  08/30/13 209 lb (94.802 kg)     Overweight woman, appears comfortable at rest. HEENT: Conjunctiva and lids normal, oropharynx clear. Neck: Supple, no elevated JVP or carotid bruits, no thyromegaly. Lungs: Clear to auscultation, nonlabored breathing at rest. Cardiac: Regular rate and rhythm, paradoxically split S2, no S3 or significant systolic murmur, no pericardial rub. Abdomen: Soft, nontender, bowel sounds present. Extremities: Trace edema, distal pulses 2+. Skin: Warm and dry. Musculoskeletal: No kyphosis. Neuropsychiatric: Alert and oriented x3, affect grossly appropriate.   ECG: ECG is ordered today and reviewed showing  sinus rhythm with left bundle branch block.  Other Studies Reviewed Today:  Last ischemic workup was via Cardiol[facility] in 2011 showing no clear evidence of scar or ischemia, abnormal septal motion attributed to left bundle branch block.   Echocardiogram 09/12/2013: Study Conclusions  - Left ventricle: Left ventricular systolic function is low normal, estimated EF 50%. There is mild diffuse hypokinesis. The cavity size was normal. Wall thickness was increased in a pattern of mild LVH. There was an increased relative contribution of atrial contraction to ventricular filling. Doppler parameters are consistent with abnormal left ventricular relaxation (grade 1 diastolic dysfunction). Doppler parameters are consistent with high ventricular filling pressure. - Ventricular septum: Septal motion showed abnormal function and dyssynergy. These changes are consistent with intraventricular conduction delay. - Mitral valve: Mild mitral annular calcification. Mild thickening of leaflet tips. There was moderate regurgitation. Two distinct jets are noted. - Left atrium: The atrium was moderately dilated. - Right atrium: The atrium was mildly dilated. - Tricuspid valve: There was mild-moderate regurgitation. - Pulmonary arteries: PA peak pressure: 37 mm Hg (S). Mildly elevated pulmonary pressures.   ASSESSMENT AND PLAN:  1. History of nonobstructive CAD, no active angina symptoms. ECG is stable with old left bundle branch block. Plan is to continue medical therapy and observation. She is on aspirin and statin.  2. History of nonischemic cardiomyopathy, LVEF 50% by assessment last year.  3. Essential hypertension, blood pressure is mildly elevated today. She reports compliance with her medications. Keep follow-up with Dr. [name].  Current medicines were reviewed at length with the patient today.   Orders Placed This Encounter  Procedures  . EKG 12-Lead    Disposition: FU  with me in 6 months.   Signed, Jonelle Sidle, MD, [facility] 08/30/2014 2:19 PM    [facility] at [facility] [address], [place], [place] [address] Phone: [phone]; Fax: [phone]

## 2014-08-30 NOTE — Patient Instructions (Signed)
Your physician wants you to follow-up in: 6 months with Dr. McDowell You will receive a reminder letter in the mail two months in advance. If you don't receive a letter, please call our office to schedule the follow-up appointment.  Your physician recommends that you continue on your current medications as directed. Please refer to the Current Medication list given to you today.  Thank you for choosing [facility]!!    

## 2014-10-08 ENCOUNTER — Other Ambulatory Visit (HOSPITAL_COMMUNITY): Payer: Self-pay | Admitting: Family Medicine

## 2014-10-08 DIAGNOSIS — Z139 Encounter for screening, unspecified: Secondary | ICD-10-CM

## 2014-11-19 ENCOUNTER — Ambulatory Visit (HOSPITAL_COMMUNITY): Payer: Medicare Other

## 2014-11-21 ENCOUNTER — Ambulatory Visit (HOSPITAL_COMMUNITY)
Admission: RE | Admit: 2014-11-21 | Discharge: 2014-11-21 | Disposition: A | Discharge status: Home / Self Care | Payer: Medicare Other | Source: Ambulatory Visit | Attending: Family Medicine | Admitting: Family Medicine

## 2014-11-21 ENCOUNTER — Other Ambulatory Visit (HOSPITAL_COMMUNITY): Payer: Self-pay | Admitting: Family Medicine

## 2014-11-21 DIAGNOSIS — Z1231 Encounter for screening mammogram for malignant neoplasm of breast: Secondary | ICD-10-CM

## 2015-02-28 ENCOUNTER — Ambulatory Visit (INDEPENDENT_AMBULATORY_CARE_PROVIDER_SITE_OTHER): Payer: Medicare Other | Admitting: Cardiology

## 2015-02-28 ENCOUNTER — Encounter: Payer: Self-pay | Admitting: Cardiology

## 2015-02-28 VITALS — BP 118/66 | HR 87 | Ht 63.0 in | Wt 210.0 lb

## 2015-02-28 DIAGNOSIS — I429 Cardiomyopathy, unspecified: Secondary | ICD-10-CM

## 2015-02-28 DIAGNOSIS — I2510 Atherosclerotic heart disease of native coronary artery without angina pectoris: Secondary | ICD-10-CM

## 2015-02-28 DIAGNOSIS — I10 Essential (primary) hypertension: Secondary | ICD-10-CM

## 2015-02-28 NOTE — Progress Notes (Signed)
Cardiology Office Note  Date: 02/28/2015   ID: [name], DOB 04-Jan-1941, MRN [id number]  PCP: [name], MD  Primary Cardiologist: [name], MD   Chief Complaint  Patient presents with  . Coronary Artery Disease  . Cardiomyopathy    History of Present Illness: [name] is a 74 y.o. female last seen in May. She presents for a follow-up visit. No angina symptoms reported with stable NYHA class II dyspnea. She continues to provide full care for her oldest sister who has dementia. This has been very stressful, but she feels that it is important, and does not want her to be in a nursing home.  We reviewed her medications which are outlined below. She reports compliance. No nitroglycerin requirement at this time. He continues to follow with Dr. [name] and has a physical with lab work scheduled later this year.  Echocardiogram from last year showed LVEF around 50%. She does not have any orthopnea or PND, no significant leg edema.   Past Medical History  Diagnosis Date  . Essential hypertension, benign   . Coronary atherosclerosis of native coronary artery     60% LAD in 2002  . Hyperlipidemia   . Left bundle branch block   . Hypothyroidism   . Obesity   . Anemia   . Chronic insomnia   . DJD (degenerative joint disease)     Left THA  . GERD (gastroesophageal reflux disease)   . Nephrolithiasis   . Cardiomyopathy (HCC)     LVEF 40-45% 2011    Current Outpatient Prescriptions  Medication Sig Dispense Refill  . amLODipine (NORVASC) 10 MG tablet Take 10 mg by mouth every morning.     Marland Kitchen aspirin 81 MG tablet Take 81 mg by mouth every morning.     . Calcium Carbonate-Vitamin D (CALCIUM 600 + D PO) Take 1 tablet by mouth 2 (two) times daily.    . furosemide (LASIX) 40 MG tablet Take 40 mg by mouth daily.     Marland Kitchen gabapentin (NEURONTIN) 100 MG capsule Take 100 mg by mouth at bedtime.    Marland Kitchen HYDROcodone-acetaminophen (NORCO) 10-325 MG per tablet Take 1 tablet  by mouth 2 (two) times daily as needed for pain.     Marland Kitchen lisinopril (PRINIVIL,ZESTRIL) 20 MG tablet Take 20 mg by mouth 2 (two) times daily.     . Multiple Vitamin (MULTIVITAMIN WITH MINERALS) TABS Take 1 tablet by mouth daily.    . nitroGLYCERIN (NITROSTAT) 0.4 MG SL tablet Place 0.4 mg under the tongue every 5 (five) minutes as needed. For chest pain     . omeprazole (PRILOSEC) 20 MG capsule Take 40 mg by mouth 2 (two) times daily.     . pravastatin (PRAVACHOL) 40 MG tablet Take 40 mg by mouth at bedtime.      No current facility-administered medications for this visit.    Allergies:  Codeine   Social History: The patient  reports that she has never smoked. She does not have any smokeless tobacco history on file. She reports that she does not drink alcohol or use illicit drugs.   ROS:  Please see the history of present illness. Otherwise, complete review of systems is positive for fatigue, insomnia.  All other systems are reviewed and negative.   Physical Exam: VS:  BP 118/66 mmHg  Pulse 87  Ht  (1.6 m)  Wt 210 lb (95.255 kg)  BMI 37.21 kg/m2  SpO2 97%, BMI Body mass index is 37.21  kg/(m^2).  Wt Readings from Last 3 Encounters:  02/28/15 210 lb (95.255 kg)  08/30/14 219 lb (99.338 kg)  12/01/13 208 lb (94.348 kg)     Overweight woman, appears comfortable at rest. HEENT: Conjunctiva and lids normal, oropharynx clear. Neck: Supple, no elevated JVP or carotid bruits, no thyromegaly. Lungs: Clear to auscultation, nonlabored breathing at rest. Cardiac: Regular rate and rhythm, paradoxically split S2, no S3 or significant systolic murmur, no pericardial rub. Abdomen: Soft, nontender, bowel sounds present. Extremities: Trace edema, distal pulses 2+.  ECG: Tracing from 08/30/2014 showed sinus rhythm with left bundle branch block.  Other Studies Reviewed Today:  Last ischemic workup was via Cardiolite in 2011 showing no clear evidence of scar or ischemia, abnormal septal motion  attributed to left bundle branch block.   Echocardiogram 09/12/2013: Study Conclusions  - Left ventricle: Left ventricular systolic function is low normal, estimated EF 50%. There is mild diffuse hypokinesis. The cavity size was normal. Wall thickness was increased in a pattern of mild LVH. There was an increased relative contribution of atrial contraction to ventricular filling. Doppler parameters are consistent with abnormal left ventricular relaxation (grade 1 diastolic dysfunction). Doppler parameters are consistent with high ventricular filling pressure. - Ventricular septum: Septal motion showed abnormal function and dyssynergy. These changes are consistent with intraventricular conduction delay. - Mitral valve: Mild mitral annular calcification. Mild thickening of leaflet tips. There was moderate regurgitation. Two distinct jets are noted. - Left atrium: The atrium was moderately dilated. - Right atrium: The atrium was mildly dilated. - Tricuspid valve: There was mild-moderate regurgitation. - Pulmonary arteries: PA peak pressure: 37 mm Hg (S). Mildly elevated pulmonary pressures.  ASSESSMENT AND PLAN:  1. Symptomatically stable CAD without active angina symptoms. Continue medical therapy and observation. She is on aspirin and statin.  2. History of cardiomyopathy with improvement in LVEF to 50% by assessment last year. No changes made to current regimen.  3. Essential hypertension, blood pressure is normal today.  Current medicines were reviewed at length with the patient today.  Disposition: FU with me in 6 months.   Signed, Jonelle Sidle, MD, [facility] 02/28/2015 10:02 AM    [facility] [address] [place], [place] [address] Phone: [phone]; Fax: [phone]

## 2015-02-28 NOTE — Patient Instructions (Signed)
Medication Instructions:  Your physician recommends that you continue on your current medications as directed. Please refer to the Current Medication list given to you today.   Labwork: NONE  Testing/Procedures: NONE  Follow-Up: Your physician wants you to follow-up in: 6 MONTHS WITH DR. MCDOWELL. You will receive a reminder letter in the mail two months in advance. If you don't receive a letter, please call our office to schedule the follow-up appointment.   Any Other Special Instructions Will Be Listed Below (If Applicable).       If you need a refill on your cardiac medications before your next appointment, please call your pharmacy.  Thanks for choosing [facility]!!!     

## 2015-04-17 ENCOUNTER — Emergency Department (HOSPITAL_COMMUNITY)
Admission: EM | Admit: 2015-04-17 | Discharge: 2015-04-17 | Disposition: A | Discharge status: Home / Self Care | Payer: Medicare Other | Attending: Emergency Medicine | Admitting: Emergency Medicine

## 2015-04-17 ENCOUNTER — Emergency Department (HOSPITAL_COMMUNITY): Payer: Medicare Other

## 2015-04-17 ENCOUNTER — Encounter (HOSPITAL_COMMUNITY): Payer: Self-pay | Admitting: *Deleted

## 2015-04-17 DIAGNOSIS — G4700 Insomnia, unspecified: Secondary | ICD-10-CM | POA: Diagnosis not present

## 2015-04-17 DIAGNOSIS — I10 Essential (primary) hypertension: Secondary | ICD-10-CM | POA: Insufficient documentation

## 2015-04-17 DIAGNOSIS — I2510 Atherosclerotic heart disease of native coronary artery without angina pectoris: Secondary | ICD-10-CM | POA: Insufficient documentation

## 2015-04-17 DIAGNOSIS — Z862 Personal history of diseases of the blood and blood-forming organs and certain disorders involving the immune mechanism: Secondary | ICD-10-CM | POA: Insufficient documentation

## 2015-04-17 DIAGNOSIS — K219 Gastro-esophageal reflux disease without esophagitis: Secondary | ICD-10-CM | POA: Insufficient documentation

## 2015-04-17 DIAGNOSIS — E669 Obesity, unspecified: Secondary | ICD-10-CM | POA: Diagnosis not present

## 2015-04-17 DIAGNOSIS — M1990 Unspecified osteoarthritis, unspecified site: Secondary | ICD-10-CM | POA: Diagnosis not present

## 2015-04-17 DIAGNOSIS — Z87448 Personal history of other diseases of urinary system: Secondary | ICD-10-CM | POA: Insufficient documentation

## 2015-04-17 DIAGNOSIS — E785 Hyperlipidemia, unspecified: Secondary | ICD-10-CM | POA: Diagnosis not present

## 2015-04-17 DIAGNOSIS — J209 Acute bronchitis, unspecified: Secondary | ICD-10-CM | POA: Diagnosis not present

## 2015-04-17 DIAGNOSIS — R05 Cough: Secondary | ICD-10-CM | POA: Diagnosis not present

## 2015-04-17 DIAGNOSIS — Z79899 Other long term (current) drug therapy: Secondary | ICD-10-CM | POA: Insufficient documentation

## 2015-04-17 DIAGNOSIS — R300 Dysuria: Secondary | ICD-10-CM | POA: Diagnosis not present

## 2015-04-17 DIAGNOSIS — Z7982 Long term (current) use of aspirin: Secondary | ICD-10-CM | POA: Insufficient documentation

## 2015-04-17 DIAGNOSIS — J40 Bronchitis, not specified as acute or chronic: Secondary | ICD-10-CM

## 2015-04-17 LAB — COMPREHENSIVE METABOLIC PANEL
ALBUMIN: 3.8 g/dL (ref 3.5–5.0)
ALK PHOS: 78 U/L (ref 38–126)
ALT: 11 U/L — ABNORMAL LOW (ref 14–54)
ANION GAP: 7 (ref 5–15)
AST: 16 U/L (ref 15–41)
BUN: 15 mg/dL (ref 6–20)
CHLORIDE: 109 mmol/L (ref 101–111)
CO2: 28 mmol/L (ref 22–32)
Calcium: 9.6 mg/dL (ref 8.9–10.3)
Creatinine, Ser: 0.85 mg/dL (ref 0.44–1.00)
GFR calc Af Amer: >60 mL/min (ref >60)
GFR calc non Af Amer: >60 mL/min (ref >60)
GLUCOSE: 100 mg/dL — AB (ref 65–99)
Potassium: 4.2 mmol/L (ref 3.5–5.1)
SODIUM: 144 mmol/L (ref 135–145)
Total Bilirubin: 0.5 mg/dL (ref 0.3–1.2)
Total Protein: 6.6 g/dL (ref 6.5–8.1)

## 2015-04-17 LAB — CBC WITH DIFFERENTIAL/PLATELET
BASOS PCT: 0 %
Basophils Absolute: 0 10*3/uL (ref 0.0–0.1)
EOS ABS: 0.1 10*3/uL (ref 0.0–0.7)
Eosinophils Relative: 2 %
HCT: 36.5 % (ref 36.0–46.0)
Hemoglobin: 11.9 g/dL — ABNORMAL LOW (ref 12.0–15.0)
Lymphocytes Relative: 41 %
Lymphs Abs: 2.8 10*3/uL (ref 0.7–4.0)
MCH: 30.1 pg (ref 26.0–34.0)
MCHC: 32.6 g/dL (ref 30.0–36.0)
MCV: 92.2 fL (ref 78.0–100.0)
MONOS PCT: 9 %
Monocytes Absolute: 0.6 10*3/uL (ref 0.1–1.0)
NEUTROS PCT: 48 %
Neutro Abs: 3.3 10*3/uL (ref 1.7–7.7)
Platelets: 261 10*3/uL (ref 150–400)
RBC: 3.96 MIL/uL (ref 3.87–5.11)
RDW: 13.3 % (ref 11.5–15.5)
WBC: 6.8 10*3/uL (ref 4.0–10.5)

## 2015-04-17 LAB — URINE MICROSCOPIC-ADD ON: RBC / HPF: NONE SEEN RBC/hpf (ref 0–5)

## 2015-04-17 LAB — URINALYSIS, ROUTINE W REFLEX MICROSCOPIC
BILIRUBIN URINE: NEGATIVE
Glucose, UA: NEGATIVE mg/dL
Hgb urine dipstick: NEGATIVE
KETONES UR: NEGATIVE mg/dL
Nitrite: NEGATIVE
PH: 5.5 (ref 5.0–8.0)
Protein, ur: NEGATIVE mg/dL
Specific Gravity, Urine: >1.03 — ABNORMAL HIGH (ref 1.005–1.030)

## 2015-04-17 MED ORDER — ONDANSETRON HCL 4 MG/2ML IJ SOLN
4.0000 mg | Freq: Once | INTRAMUSCULAR | Status: AC
  Administered 2015-04-17: 4 mg via INTRAVENOUS
  Filled 2015-04-17: qty 2

## 2015-04-17 MED ORDER — DEXTROSE 5 % IV SOLN
1.0000 g | Freq: Once | INTRAVENOUS | Status: AC
  Administered 2015-04-17: 1 g via INTRAVENOUS
  Filled 2015-04-17: qty 10

## 2015-04-17 MED ORDER — SODIUM CHLORIDE 0.9 % IV BOLUS (SEPSIS)
500.0000 mL | Freq: Once | INTRAVENOUS | Status: AC
  Administered 2015-04-17: 500 mL via INTRAVENOUS

## 2015-04-17 MED ORDER — CEPHALEXIN 500 MG PO CAPS: 500.0000 mg | ORAL_CAPSULE | Freq: Four times a day (QID) | ORAL | Status: DC

## 2015-04-17 NOTE — ED Notes (Signed)
Attempted to call Md office to enquire re- culture results, but no answer

## 2015-04-17 NOTE — ED Notes (Signed)
Having difficulty obtaining IV.  Charge nurse to try.

## 2015-04-17 NOTE — ED Notes (Signed)
Pt is being treated for UTI last week and was called by her doctor today after culture was back and told to come here. Pt has pain present in lower abdomen.  Denies any n/v/d.

## 2015-04-17 NOTE — Discharge Instructions (Signed)
Follow up with your md next week. °

## 2015-04-17 NOTE — ED Provider Notes (Signed)
CSN: [id number]     Arrival date & time 04/17/15  1439 History   First MD Initiated Contact with Patient 04/17/15 1807     Chief Complaint  Patient presents with  . Dysuria     (Consider location/radiation/quality/duration/timing/severity/associated sxs/prior Treatment) Patient is a 75 y.o. female presenting with dysuria. The history is provided by the patient (Patient complains of a cough and dysuria she's been on Cipro for possible urinate tract infection her doctor told was stopped today).  Dysuria Pain quality:  Aching and burning Pain severity:  Mild Onset quality:  Gradual Timing:  Constant Progression:  Waxing and waning Chronicity:  Recurrent Associated symptoms: no abdominal pain     Past Medical History  Diagnosis Date  . Essential hypertension, benign   . Coronary atherosclerosis of native coronary artery     60% LAD in 2002  . Hyperlipidemia   . Left bundle branch block   . Hypothyroidism   . Obesity   . Anemia   . Chronic insomnia   . DJD (degenerative joint disease)     Left THA  . GERD (gastroesophageal reflux disease)   . Nephrolithiasis   . Cardiomyopathy (HCC)     LVEF 40-45% 2011   Past Surgical History  Procedure Laterality Date  . Revision total hip arthroplasty  2009    Left THA 2009  . Appendectomy    . Abdominal hysterectomy      (partial)  . Nose surgery    . Hemorroidectomy    . Cholecystectomy    . Colonoscopy  Aug 2013    Dr. Loreta Ave, [facility]: small internal hemorrhoids and few scattered sigmoid diverticula, otherwise normal up to TI, TI appeared normal  . Esophagogastroduodenoscopy N/A 06/01/2012    RMR:5 cm hiatal hernia. Essentially normal esophagus  . Joint replacement     Family History  Problem Relation Age of Onset  . Colon cancer Neg Hx    Social History  Substance Use Topics  . Smoking status: Never Smoker   . Smokeless tobacco: None  . Alcohol Use: No   OB History    No data available     Review of  Systems  Constitutional: Negative for appetite change and fatigue.  HENT: Negative for congestion, ear discharge and sinus pressure.   Eyes: Negative for discharge.  Respiratory: Positive for cough.   Cardiovascular: Negative for chest pain.  Gastrointestinal: Negative for abdominal pain and diarrhea.  Genitourinary: Positive for dysuria. Negative for frequency and hematuria.  Musculoskeletal: Negative for back pain.  Skin: Negative for rash.  Neurological: Negative for seizures and headaches.  Psychiatric/Behavioral: Negative for hallucinations.      Allergies  Codeine  Home Medications   Prior to Admission medications   Medication Sig Start Date End Date Taking? Authorizing Provider  amLODipine (NORVASC) 10 MG tablet Take 10 mg by mouth every morning.    Yes Historical Provider, MD  aspirin 81 MG tablet Take 81 mg by mouth every morning.    Yes Historical Provider, MD  Calcium Carbonate-Vitamin D (CALCIUM 600 + D PO) Take 1 tablet by mouth 2 (two) times daily.   Yes Historical Provider, MD  ciprofloxacin (CIPRO) 250 MG tablet  twice daily for 10 days starting on 04/11/2015 04/11/15  Yes Historical Provider, MD  furosemide (LASIX) 40 MG tablet Take 40 mg by mouth daily as needed for fluid or edema.    Yes Historical Provider, MD  gabapentin (NEURONTIN) 100 MG capsule Take 100 mg by mouth at  bedtime.   Yes Historical Provider, MD  HYDROcodone-acetaminophen (NORCO) 10-325 MG per tablet Take 1 tablet by mouth 2 (two) times daily as needed for pain.  04/27/12  Yes Historical Provider, MD  HYDROMET 5-1.5 MG/5ML syrup TAKE BY MOUTH TWICE DAILY AS NEEDED FOR COUGH 04/11/15  Yes Historical Provider, MD  lisinopril (PRINIVIL,ZESTRIL) 20 MG tablet Take 20 mg by mouth 2 (two) times daily.    Yes Historical Provider, MD  Multiple Vitamin (MULTIVITAMIN WITH MINERALS) TABS Take 1 tablet by mouth daily.   Yes Historical Provider, MD  nitroGLYCERIN (NITROSTAT) 0.4 MG SL tablet Place 0.4 mg  under the tongue every 5 (five) minutes as needed. For chest pain    Yes Historical Provider, MD  omeprazole (PRILOSEC) 20 MG capsule Take 40 mg by mouth 2 (two) times daily.    Yes Historical Provider, MD  pravastatin (PRAVACHOL) 40 MG tablet Take 40 mg by mouth at bedtime.    Yes Historical Provider, MD  cephALEXin (KEFLEX) 500 MG capsule Take 1 capsule (500 mg total) by mouth 4 (four) times daily. 04/17/15   Bethann Berkshire, MD   BP 138/66 mmHg  Pulse 58  Temp(Src) 97.7 F (36.5 C) (Oral)  Resp 14  Ht 5' 2.5" (1.588 m)  Wt 207 lb (93.895 kg)  BMI 37.23 kg/m2  SpO2 100% Physical Exam  Constitutional: She is oriented to person, place, and time. She appears well-developed.  HENT:  Head: Normocephalic.  Eyes: Conjunctivae and EOM are normal. No scleral icterus.  Neck: Neck supple. No thyromegaly present.  Cardiovascular: Normal rate and regular rhythm.  Exam reveals no gallop and no friction rub.   No murmur heard. Pulmonary/Chest: No stridor. She has no wheezes. She has no rales. She exhibits no tenderness.  Abdominal: She exhibits no distension. There is no tenderness. There is no rebound.  Musculoskeletal: Normal range of motion. She exhibits no edema.  Lymphadenopathy:    She has no cervical adenopathy.  Neurological: She is oriented to person, place, and time. She exhibits normal muscle tone. Coordination normal.  Skin: No rash noted. No erythema.  Psychiatric: She has a normal mood and affect. Her behavior is normal.    ED Course  Procedures (including critical care time) Labs Review Labs Reviewed  URINALYSIS, ROUTINE W REFLEX MICROSCOPIC (NOT AT [facility]) - Abnormal; Notable for the following:    Specific Gravity, Urine >1.030 (*)    Leukocytes, UA SMALL (*)    All other components within normal limits  CBC WITH DIFFERENTIAL/PLATELET - Abnormal; Notable for the following:    Hemoglobin 11.9 (*)    All other components within normal limits  COMPREHENSIVE METABOLIC PANEL -  Abnormal; Notable for the following:    Glucose, Bld 100 (*)    ALT 11 (*)    All other components within normal limits  URINE MICROSCOPIC-ADD ON - Abnormal; Notable for the following:    Squamous Epithelial / LPF 6-30 (*)    Bacteria, UA FEW (*)    All other components within normal limits  URINE CULTURE    Imaging Review Dg Chest 2 View  04/17/2015  CLINICAL DATA:  Lower abdominal pain and cough. EXAM: CHEST  2 VIEW COMPARISON:  04/09/2011 FINDINGS: Evidence for a calcified granuloma in the medial left upper lung and calcified nodes in the hilar regions. Lungs are clear without airspace disease or pulmonary edema. Heart size is normal. Degenerative changes in the thoracic spine. No large pleural effusions. IMPRESSION: No acute cardiopulmonary disease. Old granulomatous  disease. Electronically Signed   By: Richarda Overlie M.D.   On: 04/17/2015 20:20   I have personally reviewed and evaluated these images and lab results as part of my medical decision-making.   EKG Interpretation None      MDM   Final diagnoses:  Bronchitis  Dysuria    Labs unremarkable except for specific gravity elevated and to bacteria on urine. Patient given fluids here in cold and she is covered with Rocephin IV. Chest x-ray shows no pneumonia but since she's had a persistent cough and questionable urinary tract symptoms patient will be placed on Keflex and follow-up with her doctor to review the urine culture    Bethann Berkshire, MD 04/17/15 2251

## 2015-04-19 LAB — URINE CULTURE

## 2015-04-20 ENCOUNTER — Telehealth (HOSPITAL_BASED_OUTPATIENT_CLINIC_OR_DEPARTMENT_OTHER): Payer: Self-pay | Admitting: Emergency Medicine

## 2015-04-20 NOTE — Telephone Encounter (Signed)
Post ED Visit - Positive Culture Follow-up: Successful Patient Follow-Up  Culture assessed and recommendations reviewed by: []  Enzo Bi, Pharm.D. []  Celedonio Miyamoto, Pharm.D., BCPS [x]  Garvin Fila, Pharm.D. []  Georgina Pillion, Pharm.D., BCPS []  [place], [address].D., BCPS, AAHIVP []  Estella Husk, Pharm.D., BCPS, AAHIVP []  Tennis Must, Pharm.D. []  Sherle Poe, [address].D.  Positive urine culture E. coli  []  Patient discharged without antimicrobial prescription and treatment is now indicated [x]  Organism is resistant to prescribed ED discharge antimicrobial []  Patient with positive blood cultures  Changes discussed with ED provider: Trixie Dredge PA New antibiotic prescription stop cipro and keflex, start Bactrim DS 1 tab bid x 1 week Called to [facility] [place]   Contacted patient, 04/20/15 1239   Berle Mull 04/20/2015, 12:37 PM

## 2015-04-20 NOTE — Progress Notes (Signed)
ED Antimicrobial Stewardship Positive Culture Follow Up   [name] is an 75 y.o. female who presented to [facility] on 04/17/2015 with a chief complaint of  Chief Complaint  Patient presents with  . Dysuria    Recent Results (from the past 720 hour(s))  Urine culture     Status: None   Collection Time: 04/17/15  6:10 PM  Result Value Ref Range Status   Specimen Description URINE, CLEAN CATCH  Final   Special Requests NONE  Final   Culture   Final    60,000 COLONIES/ml ESCHERICHIA COLI Confirmed Extended Spectrum Beta-Lactamase Producer (ESBL) Performed at [facility]    Report Status 04/19/2015 FINAL  Final   Organism ID, Bacteria ESCHERICHIA COLI  Final      Susceptibility   Escherichia coli - MIC*    AMPICILLIN >=32 RESISTANT Resistant     CEFAZOLIN >=64 RESISTANT Resistant     CEFTRIAXONE >=64 RESISTANT Resistant     CIPROFLOXACIN >=4 RESISTANT Resistant     GENTAMICIN >=16 RESISTANT Resistant     IMIPENEM <=0.25 SENSITIVE Sensitive     NITROFURANTOIN <=16 SENSITIVE Sensitive     TRIMETH/SULFA <=20 SENSITIVE Sensitive     AMPICILLIN/SULBACTAM >=32 RESISTANT Resistant     PIP/TAZO <=4 SENSITIVE Sensitive     * 60,000 COLONIES/ml ESCHERICHIA COLI    [x]  Treated with cephalexin, organism resistant to prescribed antimicrobial []  Patient discharged originally without antimicrobial agent and treatment is now indicated  New antibiotic prescription: Bactrim DS 1 tab BID x 1 week  ED Provider: Trixie Dredge, PA-C  Bertram Millard 04/20/2015, 9:07 AM Infectious Diseases Pharmacist Phone# [phone]

## 2015-04-24 DIAGNOSIS — N390 Urinary tract infection, site not specified: Secondary | ICD-10-CM | POA: Diagnosis not present

## 2015-06-03 ENCOUNTER — Encounter: Payer: Self-pay | Admitting: Podiatry

## 2015-06-03 ENCOUNTER — Ambulatory Visit (INDEPENDENT_AMBULATORY_CARE_PROVIDER_SITE_OTHER): Payer: Medicare Other | Admitting: Podiatry

## 2015-06-03 VITALS — BP 148/85 | HR 78 | Resp 16 | Ht 63.0 in | Wt 204.0 lb

## 2015-06-03 DIAGNOSIS — L600 Ingrowing nail: Secondary | ICD-10-CM

## 2015-06-03 NOTE — Progress Notes (Signed)
   Subjective:    Patient ID: [name], female    DOB: [birth year], 75 y.o.   MRN: [id number]  HPI Patient presents with bilateral nail problem; great toes; Right foot-great toe-lateral side; Left foot-great toe-medial side. This has been going on for the past 3 months.   Review of Systems  Neurological: Positive for dizziness.  All other systems reviewed and are negative.      Objective:   Physical Exam        Assessment & Plan:

## 2015-06-03 NOTE — Patient Instructions (Signed)

## 2015-06-04 NOTE — Progress Notes (Signed)
Subjective:     Patient ID: [name], female   DOB: July 16, 1940, 76 y.o.   MRN: [id number]  HPI patient presents with painful ingrown toenails of both feet states that she's tried to trim them and soak them without relief and a been present for at least 3 months   Review of Systems  All other systems reviewed and are negative.      Objective:   Physical Exam  Constitutional: She is oriented to person, place, and time.  Cardiovascular: Intact distal pulses.   Musculoskeletal: Normal range of motion.  Neurological: She is oriented to person, place, and time.  Skin: Skin is warm.  Nursing note and vitals reviewed.  neurovascular status intact muscle strength adequate range of motion within normal limits with patient found to have incurvated lateral border right hallux and medial border the left hallux that are very painful when pressed and makes shoe gear difficult. I did note some distal redness but I did not note any current drainage and good digital perfusion was noted bilateral     Assessment:      severe ingrown toenail deformity hallux bilateral    Plan:      H&P and conditions reviewed with patient. I've recommended removal of the corners and I explained procedures and risk and at this time I infiltrated each hallux 60 mg like Marcaine mixture remove the lateral corner the right hallux medial corner left hallux exposed matrix and applied phenol 3 applications 30 seconds followed by alcohol lavage and sterile dressing. Gave instructions on soaks and reappoint

## 2015-06-11 ENCOUNTER — Telehealth: Payer: Self-pay | Admitting: *Deleted

## 2015-06-11 NOTE — Telephone Encounter (Signed)
Left message for patient at [phone] (Home #) to check to see how they were doing from their ingrown toenail procedure that was performed on Monday, June 03, 2015. Waiting for a response.

## 2015-07-23 DIAGNOSIS — R001 Bradycardia, unspecified: Secondary | ICD-10-CM | POA: Diagnosis not present

## 2015-07-23 DIAGNOSIS — E039 Hypothyroidism, unspecified: Secondary | ICD-10-CM | POA: Diagnosis not present

## 2015-07-23 DIAGNOSIS — R5382 Chronic fatigue, unspecified: Secondary | ICD-10-CM | POA: Diagnosis not present

## 2015-07-31 ENCOUNTER — Ambulatory Visit (HOSPITAL_COMMUNITY)
Admission: RE | Admit: 2015-07-31 | Discharge: 2015-07-31 | Disposition: A | Discharge status: Home / Self Care | Payer: Medicare Other | Source: Ambulatory Visit | Attending: Family Medicine | Admitting: Family Medicine

## 2015-07-31 DIAGNOSIS — R001 Bradycardia, unspecified: Secondary | ICD-10-CM | POA: Insufficient documentation

## 2015-08-19 ENCOUNTER — Encounter: Payer: Self-pay | Admitting: Cardiology

## 2015-08-19 ENCOUNTER — Ambulatory Visit (INDEPENDENT_AMBULATORY_CARE_PROVIDER_SITE_OTHER): Payer: Medicare Other | Admitting: Cardiology

## 2015-08-19 VITALS — BP 142/70 | HR 85 | Ht 63.0 in | Wt 210.0 lb

## 2015-08-19 DIAGNOSIS — E785 Hyperlipidemia, unspecified: Secondary | ICD-10-CM | POA: Diagnosis not present

## 2015-08-19 DIAGNOSIS — I25119 Atherosclerotic heart disease of native coronary artery with unspecified angina pectoris: Secondary | ICD-10-CM | POA: Diagnosis not present

## 2015-08-19 DIAGNOSIS — Z8679 Personal history of other diseases of the circulatory system: Secondary | ICD-10-CM

## 2015-08-19 DIAGNOSIS — I10 Essential (primary) hypertension: Secondary | ICD-10-CM | POA: Diagnosis not present

## 2015-08-19 NOTE — Progress Notes (Signed)
Cardiology Office Note  Date: 08/19/2015   ID: [name], DOB 1940-11-01, MRN [id number]  PCP: [name], MD  Primary Cardiologist: [name], MD   Chief Complaint  Patient presents with  . Coronary Artery Disease  . Cardiomyopathy    History of Present Illness: [name] is a 75 y.o. female last seen in November 2016. She presents for a routine follow-up visit. She reports some exertional angina symptoms, mentioned one episode that occurred recently when she was raking leaves out of her garden. She has not tried nitroglycerin. Last ischemic workup was via Cardiolite in 2011 showing no clear evidence of scar or ischemia, abnormal septal motion attributed to left bundle branch block.   She does tell me that her sister for whom she was providing total care at home is now in a nursing facility. She still goes to visit her 5 days a week.  48 hour Holter monitor noted from April of this year, ordered by Dr. [name], I reviewed the strips. Patient was in sinus rhythm with heart rate ranging from 42 bpm up to 114 bpm, average heart rate 67 bpm. PACs noted, some of which were nonconducted. Also rare PVCs. No pauses.  We reviewed her medications. Cardiac regimen includes aspirin, Norvasc, Lasix, lisinopril, Pravachol, and as needed nitroglycerin. Follow-up ECG today shows sinus rhythm with left bundle branch block.  Past Medical History  Diagnosis Date  . Essential hypertension, benign   . Coronary atherosclerosis of native coronary artery     60% LAD in 2002  . Hyperlipidemia   . Left bundle branch block   . Hypothyroidism   . Obesity   . Anemia   . Chronic insomnia   . DJD (degenerative joint disease)     Left THA  . GERD (gastroesophageal reflux disease)   . Nephrolithiasis   . Cardiomyopathy (HCC)     LVEF 40-45% 2011    Past Surgical History  Procedure Laterality Date  . Revision total hip arthroplasty  2009    Left THA 2009  . Appendectomy    .  Abdominal hysterectomy      (partial)  . Nose surgery    . Hemorroidectomy    . Cholecystectomy    . Colonoscopy  Aug 2013    Dr. Loreta Ave, [facility]: small internal hemorrhoids and few scattered sigmoid diverticula, otherwise normal up to TI, TI appeared normal  . Esophagogastroduodenoscopy N/A 06/01/2012    RMR:5 cm hiatal hernia. Essentially normal esophagus  . Joint replacement      Current Outpatient Prescriptions  Medication Sig Dispense Refill  . amLODipine (NORVASC) 10 MG tablet Take 10 mg by mouth every morning.     Marland Kitchen aspirin 81 MG tablet Take 81 mg by mouth every morning.     . Calcium Carbonate-Vitamin D (CALCIUM 600 + D PO) Take 1 tablet by mouth 2 (two) times daily.    . furosemide (LASIX) 40 MG tablet Take 40 mg by mouth daily as needed for fluid or edema.     . gabapentin (NEURONTIN) 100 MG capsule Take 100 mg by mouth at bedtime.    Marland Kitchen HYDROcodone-acetaminophen (NORCO) 10-325 MG per tablet Take 1 tablet by mouth 2 (two) times daily as needed for pain.     Marland Kitchen HYDROMET 5-1.5 MG/5ML syrup TAKE BY MOUTH TWICE DAILY AS NEEDED FOR COUGH  0  . lisinopril (PRINIVIL,ZESTRIL) 20 MG tablet Take 20 mg by mouth 2 (two) times daily.     Marland Kitchen  Multiple Vitamin (MULTIVITAMIN WITH MINERALS) TABS Take 1 tablet by mouth daily.    . nitroGLYCERIN (NITROSTAT) 0.4 MG SL tablet Place 0.4 mg under the tongue every 5 (five) minutes as needed. For chest pain     . omeprazole (PRILOSEC) 20 MG capsule Take 40 mg by mouth 2 (two) times daily.     . pravastatin (PRAVACHOL) 40 MG tablet Take 40 mg by mouth at bedtime.      No current facility-administered medications for this visit.   Allergies:  Codeine and Tape   Social History: The patient  reports that she has never smoked. She does not have any smokeless tobacco history on file. She reports that she does not drink alcohol or use illicit drugs.   ROS:  Please see the history of present illness. Otherwise, complete review of systems is  positive for arthritis pains.  All other systems are reviewed and negative.   Physical Exam: VS:  BP 142/70 mmHg  Pulse 85  Ht 5\' 3"  (1.6 m)  Wt 210 lb (95.255 kg)  BMI 37.21 kg/m2  SpO2 99%, BMI Body mass index is 37.21 kg/(m^2).  Wt Readings from Last 3 Encounters:  08/19/15 210 lb (95.255 kg)  06/03/15 204 lb (92.534 kg)  04/17/15 207 lb (93.895 kg)    Overweight woman, appears comfortable at rest. HEENT: Conjunctiva and lids normal, oropharynx clear. Neck: Supple, no elevated JVP or carotid bruits, no thyromegaly. Lungs: Clear to auscultation, nonlabored breathing at rest. Cardiac: Regular rate and rhythm, paradoxically split S2, no S3 or significant systolic murmur, no pericardial rub. Abdomen: Soft, nontender, bowel sounds present. Extremities: Trace edema, distal pulses 2+.  ECG: I personally reviewed the prior tracing from 08/30/2014 which showed sinus rhythm with left bundle branch block.  Recent Labwork: 04/17/2015: ALT 11*; AST 16; BUN 15; Creatinine, Ser 0.85; Hemoglobin 11.9*; Platelets 261; Potassium 4.2; Sodium 144   Other Studies Reviewed Today:  Echocardiogram 09/12/2013: Study Conclusions  - Left ventricle: Left ventricular systolic function is low normal, estimated EF 50%. There is mild diffuse hypokinesis. The cavity size was normal. Wall thickness was increased in a pattern of mild LVH. There was an increased relative contribution of atrial contraction to ventricular filling. Doppler parameters are consistent with abnormal left ventricular relaxation (grade 1 diastolic dysfunction). Doppler parameters are consistent with high ventricular filling pressure. - Ventricular septum: Septal motion showed abnormal function and dyssynergy. These changes are consistent with intraventricular conduction delay. - Mitral valve: Mild mitral annular calcification. Mild thickening of leaflet tips. There was moderate regurgitation. Two distinct jets  are noted. - Left atrium: The atrium was moderately dilated. - Right atrium: The atrium was mildly dilated. - Tricuspid valve: There was mild-moderate regurgitation. - Pulmonary arteries: PA peak pressure: 37 mm Hg (S). Mildly elevated pulmonary pressures.  Assessment and Plan:  1. History of moderate LAD disease by cardiac catheterization in 2002. She is reporting exertional angina symptoms, last ischemic test was Cardiolite in 2011. Plan is to follow-up with a Lexiscan Cardiolite on medical therapy to reevaluate ischemic burden.  2. Essential hypertension, no changes made present regimen.  3. Hyperlipidemia, continues on Pravachol. She continue to follow with Dr. [name] for repeat lab work.  4. Intermittently documented bradycardia, not clearly symptomatic. Recent 48 hour Holter monitor reviewed. She is not on any heart rate lowering medications. Left bundle-branch block is chronic.  5. History of cardiomyopathy with LVEF up to 50% by last assessment in 2015.  Current medicines were reviewed with the patient  today.   Orders Placed This Encounter  Procedures  . NM Myocar Multi W/Spect W/Wall Motion / EF  . EKG 12-Lead    Disposition: FU with me in 6 months.   Signed, Jonelle Sidle, MD, [facility] 08/19/2015 9:31 AM    [facility] at [facility] [address], [place], [place] [address] Phone: [phone]; Fax: [phone]

## 2015-08-19 NOTE — Patient Instructions (Addendum)
Your physician wants you to follow-up in: 6 months with Dr.McDowell You will receive a reminder letter in the mail two months in advance. If you don't receive a letter, please call our office to schedule the follow-up appointment.    Your physician recommends that you continue on your current medications as directed. Please refer to the Current Medication list given to you today.   Your physician has requested that you have a lexiscan myoview. For further information please visit [email or web address]. Please follow instruction sheet, as given.        Thank you for choosing [facility] !        

## 2015-08-28 ENCOUNTER — Encounter (HOSPITAL_COMMUNITY)
Admission: RE | Admit: 2015-08-28 | Discharge: 2015-08-28 | Disposition: A | Discharge status: Home / Self Care | Payer: Medicare Other | Source: Ambulatory Visit | Attending: Cardiology | Admitting: Cardiology

## 2015-08-28 ENCOUNTER — Inpatient Hospital Stay (HOSPITAL_COMMUNITY): Admission: RE | Admit: 2015-08-28 | Payer: Medicare Other | Source: Ambulatory Visit

## 2015-08-28 ENCOUNTER — Encounter (HOSPITAL_COMMUNITY): Payer: Self-pay

## 2015-08-28 DIAGNOSIS — I510 Cardiac septal defect, acquired: Secondary | ICD-10-CM | POA: Diagnosis not present

## 2015-08-28 DIAGNOSIS — I25119 Atherosclerotic heart disease of native coronary artery with unspecified angina pectoris: Secondary | ICD-10-CM | POA: Insufficient documentation

## 2015-08-28 HISTORY — DX: Heart failure, unspecified: I50.9

## 2015-08-28 LAB — NM MYOCAR MULTI W/SPECT W/WALL MOTION / EF
CHL CUP NUCLEAR SDS: 4
CHL CUP NUCLEAR SRS: 8
CSEPPHR: 90 {beats}/min
LHR: 0.3
LV dias vol: 101 mL (ref 46–106)
LVSYSVOL: 38 mL
NUC STRESS TID: 1.09
Rest HR: 54 {beats}/min
SSS: 12

## 2015-08-28 MED ORDER — TECHNETIUM TC 99M TETROFOSMIN IV KIT
10.0000 | PACK | Freq: Once | INTRAVENOUS | Status: AC | PRN
  Administered 2015-08-28: 9.4 via INTRAVENOUS

## 2015-08-28 MED ORDER — REGADENOSON 0.4 MG/5ML IV SOLN
INTRAVENOUS | Status: AC
  Administered 2015-08-28: 0.4 mg via INTRAVENOUS
  Filled 2015-08-28: qty 5

## 2015-08-28 MED ORDER — SODIUM CHLORIDE 0.9% FLUSH
INTRAVENOUS | Status: AC
  Administered 2015-08-28: 10 mL via INTRAVENOUS
  Filled 2015-08-28: qty 10

## 2015-08-28 MED ORDER — TECHNETIUM TC 99M TETROFOSMIN IV KIT
30.0000 | PACK | Freq: Once | INTRAVENOUS | Status: AC | PRN
  Administered 2015-08-28: 31.7 via INTRAVENOUS

## 2015-08-29 ENCOUNTER — Telehealth: Payer: Self-pay

## 2015-08-29 MED ORDER — ISOSORBIDE MONONITRATE ER 30 MG PO TB24: 30.0000 mg | ORAL_TABLET | Freq: Every day | ORAL | Status: DC

## 2015-08-29 NOTE — Telephone Encounter (Signed)
-----   Message from Jonelle Sidle, MD sent at 08/29/2015  7:28 AM EDT ----- Reviewed report. Probable small region of ischemia in the anterior wall corresponding to some progression in LAD disease documented previously. LVEF is normal and the study is overall low risk however. Will try to continue with medical therapy, add Imdur 30 mg daily.

## 2015-08-29 NOTE — Telephone Encounter (Signed)
Results given,e-scribed rx,copy report to pcp

## 2015-10-21 DIAGNOSIS — R5382 Chronic fatigue, unspecified: Secondary | ICD-10-CM | POA: Diagnosis not present

## 2015-10-21 DIAGNOSIS — N390 Urinary tract infection, site not specified: Secondary | ICD-10-CM | POA: Diagnosis not present

## 2015-10-21 DIAGNOSIS — E785 Hyperlipidemia, unspecified: Secondary | ICD-10-CM | POA: Diagnosis not present

## 2015-10-21 DIAGNOSIS — I10 Essential (primary) hypertension: Secondary | ICD-10-CM | POA: Diagnosis not present

## 2015-10-25 ENCOUNTER — Other Ambulatory Visit (HOSPITAL_COMMUNITY): Payer: Self-pay | Admitting: Family Medicine

## 2015-10-25 DIAGNOSIS — Z1231 Encounter for screening mammogram for malignant neoplasm of breast: Secondary | ICD-10-CM

## 2015-11-13 DIAGNOSIS — J219 Acute bronchiolitis, unspecified: Secondary | ICD-10-CM | POA: Diagnosis not present

## 2015-11-13 DIAGNOSIS — J209 Acute bronchitis, unspecified: Secondary | ICD-10-CM | POA: Diagnosis not present

## 2015-11-19 ENCOUNTER — Other Ambulatory Visit (HOSPITAL_COMMUNITY): Payer: Self-pay | Admitting: Family Medicine

## 2015-11-19 ENCOUNTER — Ambulatory Visit (HOSPITAL_COMMUNITY)
Admission: RE | Admit: 2015-11-19 | Discharge: 2015-11-19 | Disposition: A | Discharge status: Home / Self Care | Payer: Medicare Other | Source: Ambulatory Visit | Attending: Family Medicine | Admitting: Family Medicine

## 2015-11-19 DIAGNOSIS — R05 Cough: Secondary | ICD-10-CM | POA: Diagnosis not present

## 2015-11-19 DIAGNOSIS — M545 Low back pain: Secondary | ICD-10-CM | POA: Diagnosis not present

## 2015-11-19 DIAGNOSIS — I517 Cardiomegaly: Secondary | ICD-10-CM | POA: Diagnosis not present

## 2015-11-19 DIAGNOSIS — M5136 Other intervertebral disc degeneration, lumbar region: Secondary | ICD-10-CM | POA: Insufficient documentation

## 2015-11-19 DIAGNOSIS — R053 Chronic cough: Secondary | ICD-10-CM

## 2015-11-19 DIAGNOSIS — M47816 Spondylosis without myelopathy or radiculopathy, lumbar region: Secondary | ICD-10-CM | POA: Diagnosis not present

## 2015-11-19 DIAGNOSIS — J8410 Pulmonary fibrosis, unspecified: Secondary | ICD-10-CM | POA: Diagnosis not present

## 2015-11-25 ENCOUNTER — Ambulatory Visit (HOSPITAL_COMMUNITY)
Admission: RE | Admit: 2015-11-25 | Discharge: 2015-11-25 | Disposition: A | Discharge status: Home / Self Care | Payer: Medicare Other | Source: Ambulatory Visit | Attending: Family Medicine | Admitting: Family Medicine

## 2015-11-25 DIAGNOSIS — Z1231 Encounter for screening mammogram for malignant neoplasm of breast: Secondary | ICD-10-CM | POA: Insufficient documentation

## 2015-11-27 DIAGNOSIS — R05 Cough: Secondary | ICD-10-CM | POA: Diagnosis not present

## 2015-11-27 DIAGNOSIS — N390 Urinary tract infection, site not specified: Secondary | ICD-10-CM | POA: Diagnosis not present

## 2015-11-27 DIAGNOSIS — Z0000 Encounter for general adult medical examination without abnormal findings: Secondary | ICD-10-CM | POA: Diagnosis not present

## 2016-01-20 DIAGNOSIS — N390 Urinary tract infection, site not specified: Secondary | ICD-10-CM | POA: Diagnosis not present

## 2016-01-20 DIAGNOSIS — I10 Essential (primary) hypertension: Secondary | ICD-10-CM | POA: Diagnosis not present

## 2016-01-20 DIAGNOSIS — Z23 Encounter for immunization: Secondary | ICD-10-CM | POA: Diagnosis not present

## 2016-01-20 DIAGNOSIS — E039 Hypothyroidism, unspecified: Secondary | ICD-10-CM | POA: Diagnosis not present

## 2016-02-20 NOTE — Progress Notes (Signed)
Cardiology Office Note  Date: 02/21/2016   ID: Matison, Stoneback 05-11-1940, MRN [id number]  PCP: [name], MD  Primary Cardiologist: [name], MD   Chief Complaint  Patient presents with  . Coronary Artery Disease    History of Present Illness: [name] is a 75 y.o. female last seen in May. She presents for a routine follow-up visit. States that she has been doing well, no angina symptoms. She has been dancing 4 days a week, enjoying this.  I reviewed her medications. Cardiac regimen includes aspirin, Norvasc, Lasix, Imdur, lisinopril, Pravachol, and as needed nitroglycerin.  Follow-up Myoview from May of this year was overall low risk as outlined below. We discussed the results today.  Past Medical History:  Diagnosis Date  . Anemia   . Cardiomyopathy (HCC)    LVEF 40-45% 2011  . CHF (congestive heart failure) (HCC)   . Chronic insomnia   . Coronary atherosclerosis of native coronary artery    60% LAD in 2002  . DJD (degenerative joint disease)    Left THA  . Essential hypertension, benign   . GERD (gastroesophageal reflux disease)   . Hyperlipidemia   . Hypothyroidism   . Left bundle branch block   . Nephrolithiasis   . Obesity     Past Surgical History:  Procedure Laterality Date  . ABDOMINAL HYSTERECTOMY     (partial)  . APPENDECTOMY    . CHOLECYSTECTOMY    . COLONOSCOPY  Aug 2013   Dr. Loreta Ave, [facility]: small internal hemorrhoids and few scattered sigmoid diverticula, otherwise normal up to TI, TI appeared normal  . ESOPHAGOGASTRODUODENOSCOPY N/A 06/01/2012   RMR:5 cm hiatal hernia. Essentially normal esophagus  . HEMORROIDECTOMY    . JOINT REPLACEMENT    . NOSE SURGERY    . REVISION TOTAL HIP ARTHROPLASTY  2009   Left THA 2009    Current Outpatient Prescriptions  Medication Sig Dispense Refill  . amLODipine (NORVASC) 10 MG tablet Take 10 mg by mouth every morning.     Marland Kitchen aspirin 81 MG tablet Take 81 mg by  mouth every morning.     . Calcium Carbonate-Vitamin D (CALCIUM 600 + D PO) Take 1 tablet by mouth 2 (two) times daily.    . furosemide (LASIX) 40 MG tablet Take 20 mg by mouth daily as needed for fluid or edema.     . gabapentin (NEURONTIN) 100 MG capsule Take 100 mg by mouth at bedtime.    . isosorbide mononitrate (IMDUR) 30 MG 24 hr tablet Take 1 tablet (30 mg total) by mouth daily. 30 tablet 11  . lisinopril (PRINIVIL,ZESTRIL) 20 MG tablet Take 20 mg by mouth 2 (two) times daily.     . Multiple Vitamin (MULTIVITAMIN WITH MINERALS) TABS Take 1 tablet by mouth daily.    . nitroGLYCERIN (NITROSTAT) 0.4 MG SL tablet Place 0.4 mg under the tongue every 5 (five) minutes as needed. For chest pain     . omeprazole (PRILOSEC) 20 MG capsule Take 40 mg by mouth daily.     . pravastatin (PRAVACHOL) 40 MG tablet Take 40 mg by mouth at bedtime.      No current facility-administered medications for this visit.    Allergies:  Codeine and Tape   Social History: The patient  reports that she has never smoked. She has never used smokeless tobacco. She reports that she does not drink alcohol or use drugs.   ROS:  Please see the history  of present illness. Otherwise, complete review of systems is positive for increased urinary frequency.  All other systems are reviewed and negative.   Physical Exam: VS:  BP (!) 144/78   Pulse 74   Ht 5\' 3"  (1.6 m)   Wt 209 lb (94.8 kg)   SpO2 98%   BMI 37.02 kg/m , BMI Body mass index is 37.02 kg/m.  Wt Readings from Last 3 Encounters:  02/21/16 209 lb (94.8 kg)  08/19/15 210 lb (95.3 kg)  06/03/15 204 lb (92.5 kg)    Overweight woman, appears comfortable at rest. HEENT: Conjunctiva and lids normal, oropharynx clear. Neck: Supple, no elevated JVP or carotid bruits, no thyromegaly. Lungs: Clear to auscultation, nonlabored breathing at rest. Cardiac: Regular rate and rhythm, paradoxically split S2, no S3 or significant systolic murmur, no pericardial  rub. Abdomen: Soft, nontender, bowel sounds present. Extremities: Trace edema, distal pulses 2+. Skin: Warm and dry. Muscular skeletal: No kyphosis. Neuropsychiatric: Alert and oriented 3, affect appropriate.  ECG: I personally reviewed the tracing from 08/19/2015 which showed sinus bradycardia with left bundle-branch block.  Recent Labwork: 04/17/2015: ALT 11; AST 16; BUN 15; Creatinine, Ser 0.85; Hemoglobin 11.9; Platelets 261; Potassium 4.2; Sodium 144   Other Studies Reviewed Today:  Echocardiogram 09/12/2013: Study Conclusions  - Left ventricle: Left ventricular systolic function is low normal, estimated EF 50%. There is mild diffuse hypokinesis. The cavity size was normal. Wall thickness was increased in a pattern of mild LVH. There was an increased relative contribution of atrial contraction to ventricular filling. Doppler parameters are consistent with abnormal left ventricular relaxation (grade 1 diastolic dysfunction). Doppler parameters are consistent with high ventricular filling pressure. - Ventricular septum: Septal motion showed abnormal function and dyssynergy. These changes are consistent with intraventricular conduction delay. - Mitral valve: Mild mitral annular calcification. Mild thickening of leaflet tips. There was moderate regurgitation. Two distinct jets are noted. - Left atrium: The atrium was moderately dilated. - Right atrium: The atrium was mildly dilated. - Tricuspid valve: There was mild-moderate regurgitation. - Pulmonary arteries: PA peak pressure: 37 mm Hg (S). Mildly elevated pulmonary pressures.  Lexiscan Myoview 08/28/2015:  This is a low risk study.  Nuclear stress EF: 63%.  Defect 1: There is a defect present in the mid anteroseptal, apical anterior and apical septal location. There is a mild degree of reversibility. While LBBB is likely contributing to defect, a small degree of ischemia cannot entirely be  excluded.  Assessment and Plan:  1. CAD with moderate LAD stenosis documented in 2002. She has no active angina symptoms and underwent a low risk Lexiscan Myoview back in May of this year. We will plan to continue medical therapy and observation.  2. Cardiomyopathy with normalization of LVEF on medical therapy.  3. Essential hypertension, systolic blood pressure in the 140s today. No changes made to current regimen. Keep follow-up with Dr. Loleta ChanceHill.  4. Hyperlipidemia, on Pravachol.  Current medicines were reviewed with the patient today.  Disposition: Follow-up in 6 months.  Signed, Jonelle SidleSamuel G. Wayden Schwertner, MD, [facility] 02/21/2016 10:13 AM    [facility] at [facility] [address], [place], [place] [address] Phone: [phone]; Fax: [phone]

## 2016-02-21 ENCOUNTER — Encounter: Payer: Self-pay | Admitting: Cardiology

## 2016-02-21 ENCOUNTER — Ambulatory Visit (INDEPENDENT_AMBULATORY_CARE_PROVIDER_SITE_OTHER): Payer: Medicare Other | Admitting: Cardiology

## 2016-02-21 VITALS — BP 144/78 | HR 74 | Ht 63.0 in | Wt 209.0 lb

## 2016-02-21 DIAGNOSIS — I2510 Atherosclerotic heart disease of native coronary artery without angina pectoris: Secondary | ICD-10-CM

## 2016-02-21 DIAGNOSIS — I10 Essential (primary) hypertension: Secondary | ICD-10-CM

## 2016-02-21 DIAGNOSIS — E782 Mixed hyperlipidemia: Secondary | ICD-10-CM | POA: Diagnosis not present

## 2016-02-21 DIAGNOSIS — Z8679 Personal history of other diseases of the circulatory system: Secondary | ICD-10-CM | POA: Diagnosis not present

## 2016-02-21 NOTE — Patient Instructions (Signed)
Your physician wants you to follow-up in:  6 months dr Randa Spike will receive a reminder letter in the mail two months in advance. If you don't receive a letter, please call our office to schedule the follow-up appointment.     Your physician recommends that you continue on your current medications as directed. Please refer to the Current Medication list given to you today.    Thank you for choosing [facility] !

## 2016-02-25 ENCOUNTER — Emergency Department (HOSPITAL_COMMUNITY)
Admission: EM | Admit: 2016-02-25 | Discharge: 2016-02-25 | Disposition: A | Discharge status: Home / Self Care | Payer: Medicare Other | Attending: Emergency Medicine | Admitting: Emergency Medicine

## 2016-02-25 ENCOUNTER — Emergency Department (HOSPITAL_COMMUNITY): Payer: Medicare Other

## 2016-02-25 ENCOUNTER — Encounter (HOSPITAL_COMMUNITY): Payer: Self-pay

## 2016-02-25 DIAGNOSIS — E039 Hypothyroidism, unspecified: Secondary | ICD-10-CM | POA: Insufficient documentation

## 2016-02-25 DIAGNOSIS — Z7982 Long term (current) use of aspirin: Secondary | ICD-10-CM | POA: Diagnosis not present

## 2016-02-25 DIAGNOSIS — R319 Hematuria, unspecified: Secondary | ICD-10-CM | POA: Diagnosis not present

## 2016-02-25 DIAGNOSIS — R300 Dysuria: Secondary | ICD-10-CM | POA: Diagnosis present

## 2016-02-25 DIAGNOSIS — Z79899 Other long term (current) drug therapy: Secondary | ICD-10-CM | POA: Insufficient documentation

## 2016-02-25 DIAGNOSIS — N200 Calculus of kidney: Secondary | ICD-10-CM

## 2016-02-25 DIAGNOSIS — I2510 Atherosclerotic heart disease of native coronary artery without angina pectoris: Secondary | ICD-10-CM | POA: Diagnosis not present

## 2016-02-25 DIAGNOSIS — Z951 Presence of aortocoronary bypass graft: Secondary | ICD-10-CM | POA: Diagnosis not present

## 2016-02-25 DIAGNOSIS — N130 Hydronephrosis with ureteropelvic junction obstruction: Secondary | ICD-10-CM | POA: Diagnosis not present

## 2016-02-25 DIAGNOSIS — N390 Urinary tract infection, site not specified: Secondary | ICD-10-CM

## 2016-02-25 DIAGNOSIS — I10 Essential (primary) hypertension: Secondary | ICD-10-CM | POA: Insufficient documentation

## 2016-02-25 LAB — URINALYSIS, ROUTINE W REFLEX MICROSCOPIC
Bilirubin Urine: NEGATIVE
Glucose, UA: NEGATIVE mg/dL
Ketones, ur: NEGATIVE mg/dL
Nitrite: NEGATIVE
Protein, ur: NEGATIVE mg/dL
Specific Gravity, Urine: 1.02 (ref 1.005–1.030)
pH: 6 (ref 5.0–8.0)

## 2016-02-25 LAB — CBC WITH DIFFERENTIAL/PLATELET
Basophils Absolute: 0 10*3/uL (ref 0.0–0.1)
Basophils Relative: 0 %
Eosinophils Absolute: 0.2 10*3/uL (ref 0.0–0.7)
Eosinophils Relative: 2 %
HCT: 39.7 % (ref 36.0–46.0)
Hemoglobin: 13 g/dL (ref 12.0–15.0)
Lymphocytes Relative: 23 %
Lymphs Abs: 1.7 10*3/uL (ref 0.7–4.0)
MCH: 30.7 pg (ref 26.0–34.0)
MCHC: 32.7 g/dL (ref 30.0–36.0)
MCV: 93.6 fL (ref 78.0–100.0)
Monocytes Absolute: 0.8 10*3/uL (ref 0.1–1.0)
Monocytes Relative: 11 %
Neutro Abs: 4.7 10*3/uL (ref 1.7–7.7)
Neutrophils Relative %: 64 %
Platelets: 267 10*3/uL (ref 150–400)
RBC: 4.24 MIL/uL (ref 3.87–5.11)
RDW: 13.3 % (ref 11.5–15.5)
WBC: 7.4 10*3/uL (ref 4.0–10.5)

## 2016-02-25 LAB — BASIC METABOLIC PANEL
Anion gap: 6 (ref 5–15)
BUN: 15 mg/dL (ref 6–20)
CO2: 27 mmol/L (ref 22–32)
Calcium: 9.5 mg/dL (ref 8.9–10.3)
Chloride: 107 mmol/L (ref 101–111)
Creatinine, Ser: 0.94 mg/dL (ref 0.44–1.00)
GFR calc Af Amer: >60 mL/min (ref >60)
GFR calc non Af Amer: 59 mL/min — ABNORMAL LOW (ref >60)
Glucose, Bld: 103 mg/dL — ABNORMAL HIGH (ref 65–99)
Potassium: 4.1 mmol/L (ref 3.5–5.1)
Sodium: 140 mmol/L (ref 135–145)

## 2016-02-25 LAB — URINE MICROSCOPIC-ADD ON

## 2016-02-25 MED ORDER — OXYCODONE-ACETAMINOPHEN 5-325 MG PO TABS
1.0000 | ORAL_TABLET | Freq: Once | ORAL | Status: AC
  Administered 2016-02-25: 1 via ORAL
  Filled 2016-02-25: qty 1

## 2016-02-25 MED ORDER — HYDROXYZINE HCL 25 MG PO TABS
25.0000 mg | ORAL_TABLET | Freq: Once | ORAL | Status: AC | PRN
  Administered 2016-02-25: 25 mg via ORAL
  Filled 2016-02-25: qty 1

## 2016-02-25 MED ORDER — ONDANSETRON HCL 4 MG PO TABS: 4.0000 mg | ORAL_TABLET | Freq: Three times a day (TID) | ORAL | 0 refills | Status: DC | PRN

## 2016-02-25 MED ORDER — CEPHALEXIN 500 MG PO CAPS: 500.0000 mg | ORAL_CAPSULE | Freq: Two times a day (BID) | ORAL | 0 refills | Status: AC

## 2016-02-25 MED ORDER — TAMSULOSIN HCL 0.4 MG PO CAPS: 0.4000 mg | ORAL_CAPSULE | Freq: Every day | ORAL | 0 refills | Status: AC

## 2016-02-25 MED ORDER — OXYCODONE-ACETAMINOPHEN 5-325 MG PO TABS: 1.0000 | ORAL_TABLET | Freq: Four times a day (QID) | ORAL | 0 refills | Status: DC | PRN

## 2016-02-25 NOTE — Discharge Instructions (Signed)
You were seen today for abdominal and back pain. It seems as though you have a UTI and a left kidney stone. We have given you medication to help clear your infection (Keflex), please take all of antibiotics as prescribed. We also prescribed a medication to help you pass the stone (Tamsulosin). We also prescribed Zofran for nausea and Percocet for pain.   [facility] [place] today for a follow up appointment for a stone, the number and location is listed above.   If you develop a fever or are feeling worse, please come back to the emergency department. Take care!

## 2016-02-25 NOTE — ED Triage Notes (Addendum)
Pt reports LLQ pain, urinary frequency, burning with urination and pain in lower back x 2 weeks.  Reports history of kidney stones.

## 2016-02-25 NOTE — ED Provider Notes (Signed)
[facility]-EMERGENCY DEPT Provider Note   CSN: [id number] Arrival date & time: 02/25/16  0803   History   Chief Complaint Chief Complaint  Patient presents with  . Abdominal Pain  . Urinary Frequency    HPI   [name] is a 75 y.o. female with PMH of nephrolithiasis, CHF, CAD, HTN, HLD, and hypothyroidism who presents with abdominal pain, left flank pain and dysuria. Patient states that she has had dysuria for the last couple weeks. She endorses increased frequency and states she feels like she needs to urinate every 10 minutes. Denies any fevers, chills, nausea, vomiting diarrhea, change in appetite. She notes her urine has been light yellow to clear in color. No gross hematuria. Admits to hx of kidney stones and frequent UTI's. Her last UTI was "several months ago". Left flank pain is constant and has been increasing in intensity over the last couple weeks.   Of note, patient has a hx of abdominal surgeries including cholecystectomy, appendectomy, hysterectomy. She has also been hospitalized for diverticulitis in the past.   Past Medical History:  Diagnosis Date  . Anemia   . Cardiomyopathy (HCC)    LVEF 40-45% 2011  . CHF (congestive heart failure) (HCC)   . Chronic insomnia   . Coronary atherosclerosis of native coronary artery    60% LAD in 2002  . DJD (degenerative joint disease)    Left THA  . Essential hypertension, benign   . GERD (gastroesophageal reflux disease)   . Hyperlipidemia   . Hypothyroidism   . Left bundle branch block   . Nephrolithiasis   . Obesity     Patient Active Problem List   Diagnosis Date Noted  . 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 Diverticulitis 07/06/2013  . Rectal bleeding 12/29/2012  . Coronary atherosclerosis of native coronary artery   . DJD (degenerative joint disease)   . HYPOTHYROIDISM 12/05/2008  . Hyperlipidemia 12/05/2008  . Essential hypertension, benign 12/05/2008    Past Surgical History:  Procedure  Laterality Date  . ABDOMINAL HYSTERECTOMY     (partial)  . APPENDECTOMY    . CHOLECYSTECTOMY    . COLONOSCOPY  Aug 2013   [facility]: small internal hemorrhoids and few scattered sigmoid diverticula, otherwise normal up to TI, TI appeared normal  . ESOPHAGOGASTRODUODENOSCOPY N/A 06/01/2012   RMR:5 cm hiatal hernia. Essentially normal esophagus  . HEMORROIDECTOMY    . JOINT REPLACEMENT    . NOSE SURGERY    . REVISION TOTAL HIP ARTHROPLASTY  2009   Left THA 2009    OB History    No data available       Home Medications    Prior to Admission medications   Medication Sig Start Date End Date Taking? Authorizing Provider  amLODipine (NORVASC) 10 MG tablet Take 10 mg by mouth every morning.     Historical Provider, MD  aspirin 81 MG tablet Take 81 mg by mouth every morning.     Historical Provider, MD  Calcium Carbonate-Vitamin D (CALCIUM 600 + D PO) Take 1 tablet by mouth 2 (two) times daily.    Historical Provider, MD  cephALEXin (KEFLEX) 500 MG capsule Take 1 capsule (500 mg total) by mouth 2 (two) times daily. 02/25/16 03/03/16  Beaulah Dinninghristina M Desire Fulp, MD  furosemide (LASIX) 40 MG tablet Take 20 mg by mouth daily as needed for fluid or edema.     Historical Provider, MD  gabapentin (NEURONTIN) 100 MG capsule Take 100 mg by mouth at bedtime.  Historical Provider, MD  isosorbide mononitrate (IMDUR) 30 MG 24 hr tablet Take 1 tablet (30 mg total) by mouth daily. 08/29/15   Jonelle Sidle, MD  lisinopril (PRINIVIL,ZESTRIL) 20 MG tablet Take 20 mg by mouth 2 (two) times daily.     Historical Provider, MD  Multiple Vitamin (MULTIVITAMIN WITH MINERALS) TABS Take 1 tablet by mouth daily.    Historical Provider, MD  nitroGLYCERIN (NITROSTAT) 0.4 MG SL tablet Place 0.4 mg under the tongue every 5 (five) minutes as needed. For chest pain     Historical Provider, MD  omeprazole (PRILOSEC) 20 MG capsule Take 40 mg by mouth daily.     Historical Provider, MD  ondansetron  (ZOFRAN) 4 MG tablet Take 1 tablet (4 mg total) by mouth every 8 (eight) hours as needed for nausea or vomiting. 02/25/16   Beaulah Dinning, MD  oxyCODONE-acetaminophen (ROXICET) 5-325 MG tablet Take 1 tablet by mouth every 6 (six) hours as needed for severe pain. 02/25/16   Beaulah Dinning, MD  pravastatin (PRAVACHOL) 40 MG tablet Take 40 mg by mouth at bedtime.     Historical Provider, MD  tamsulosin (FLOMAX) 0.4 MG CAPS capsule Take 1 capsule (0.4 mg total) by mouth daily. 02/25/16   Beaulah Dinning, MD    Family History Family History  Problem Relation Age of Onset  . Colon cancer Neg Hx     Social History Social History  Substance Use Topics  . Smoking status: Never Smoker  . Smokeless tobacco: Never Used  . Alcohol use No     Allergies   Codeine and Tape   Review of Systems Review of Systems  10 Systems reviewed and are negative for acute change except as noted in the HPI.   Physical Exam Updated Vital Signs BP 132/70   Pulse (!) 55   Temp 97.9 F (36.6 C)   Resp 18   Ht 5\' 3"  (1.6 m)   Wt 94.8 kg   SpO2 93%   BMI 37.02 kg/m   Physical Exam  Constitutional: She is oriented to person, place, and time. She appears well-developed and well-nourished.  HENT:  Head: Normocephalic and atraumatic.  Right Ear: External ear normal.  Left Ear: External ear normal.  Nose: Nose normal.  Mouth/Throat: Oropharynx is clear and moist.  Eyes: Conjunctivae and EOM are normal. Pupils are equal, round, and reactive to light.  Neck: Normal range of motion. Neck supple.  Cardiovascular: Normal rate, regular rhythm and intact distal pulses.   Murmur heard. 1+ pitting edema in bilateral ankles  Pulmonary/Chest: Effort normal and breath sounds normal. No respiratory distress. She has no wheezes.  Abdominal: Soft. Bowel sounds are normal. She exhibits no distension and no mass. There is tenderness (pelvic).  Musculoskeletal: Normal range of motion. She exhibits no  edema.       Arms: + CVA tenderness on left  Lymphadenopathy:    She has no cervical adenopathy.  Neurological: She is alert and oriented to person, place, and time. No sensory deficit. She exhibits normal muscle tone.  Skin: Skin is warm. Capillary refill takes less than 2 seconds. No erythema.  Psychiatric: She has a normal mood and affect. Her behavior is normal. Judgment and thought content normal.     ED Treatments / Results  Labs (all labs ordered are listed, but only abnormal results are displayed) Labs Reviewed  URINALYSIS, ROUTINE W REFLEX MICROSCOPIC (NOT AT [facility]) - Abnormal; Notable for the following:  Result Value   APPearance HAZY (*)    Hgb urine dipstick SMALL (*)    Leukocytes, UA LARGE (*)    All other components within normal limits  URINE MICROSCOPIC-ADD ON - Abnormal; Notable for the following:    Squamous Epithelial / LPF 6-30 (*)    Bacteria, UA MANY (*)    All other components within normal limits  BASIC METABOLIC PANEL - Abnormal; Notable for the following:    Glucose, Bld 103 (*)    GFR calc non Af Amer 59 (*)    All other components within normal limits  URINE CULTURE  CBC WITH DIFFERENTIAL/PLATELET    EKG  EKG Interpretation None       Radiology Ct Renal Stone Study  Result Date: 02/25/2016 CLINICAL DATA:  Left flank pain radiating to the left groin for 2 weeks with dysuria and pelvic area. History of nephrolithiasis. EXAM: CT ABDOMEN AND PELVIS WITHOUT CONTRAST TECHNIQUE: Multidetector CT imaging of the abdomen and pelvis was performed following the standard protocol without IV contrast. COMPARISON:  07/06/2013 CT abdomen/pelvis. FINDINGS: Lower chest: Right middle lobe 2 mm solid pulmonary nodule (series 6/ image 7) is stable since 07/06/2013 and considered benign. Stable trace pericardial effusion/ thickening. Hepatobiliary: Normal liver with no liver mass. Cholecystectomy. No biliary ductal dilatation. Pancreas: Normal, with no mass or  duct dilation. Spleen: Normal size spleen. Stable granulomatous calcifications throughout the spleen. No splenic mass. Adrenals/Urinary Tract: Normal adrenals. No hydronephrosis. No right renal stones. There are 2 punctate nonobstructing 1-2 mm stones in the lower left kidney. No contour deforming renal mass. There is a poorly visualized 6 x 3 mm left ureterovesical junction stone due to streak artifact from left hip hardware. Visualized ureters are normal caliber. Bladder is poorly visualized with no gross bladder abnormality. Stomach/Bowel: Small hiatal hernia. Otherwise collapsed and grossly normal stomach. Normal caliber small bowel with no small bowel wall thickening. Appendectomy. Marked sigmoid diverticulosis, with no large bowel wall thickening or pericolonic fat stranding. Vascular/Lymphatic: Atherosclerotic nonaneurysmal abdominal aorta. No pathologically enlarged lymph nodes in the abdomen or pelvis. Reproductive: Status post hysterectomy, with no gross abnormal findings at the vaginal cuff. No gross adnexal mass. Other: No pneumoperitoneum, ascites or focal fluid collection. Musculoskeletal: No aggressive appearing focal osseous lesions. Partially visualized left total hip arthroplasty. Moderate thoracolumbar spondylosis. IMPRESSION: 1. Poorly visualized 6 x 3 mm stone at the left ureterovesical junction (due to streak artifact from left hip hardware). No left hydronephrosis. Ureters appear normal caliber. 2. Additional punctate nonobstructing lower left renal stones. 3. Additional findings include aortic atherosclerosis, small hiatal hernia and marked sigmoid diverticulosis. Electronically Signed   By: Delbert Phenix M.D.   On: 02/25/2016 10:32    Procedures Procedures (including critical care time)  Medications Ordered in ED Medications  oxyCODONE-acetaminophen (PERCOCET/ROXICET) 5-325 MG per tablet 1 tablet (1 tablet Oral Given 02/25/16 0937)  hydrOXYzine (ATARAX/VISTARIL) tablet 25 mg (25 mg  Oral Given 02/25/16 0937)     Initial Impression / Assessment and Plan / ED Course  I have reviewed the triage vital signs and the nursing notes.  Pertinent labs & imaging results that were available during my care of the patient were reviewed by me and considered in my medical decision making (see chart for details).  Clinical Course    YESENIA LOCURTO is a 75 y.o. female with PMH of nephrolithiasis, CHF, CAD, HTN, HLD, and hypothyroidism who presented with abdominal pain, left flank pain and dysuria. Vitals stable without fever. Physical exam  showing left flank pain and pelvic pain. CBC and BMP unremarkable. UA showing many bacteria, large leukocytes, small hgb but is also a dirty catch with 6-30 squams. CT abdomen showing 6 x 3 mm non obstructing stone in the left ureterovesical junction.   Discussed patient with urologist Dr. Marlou Porch who recommended patient follow up with [facility] urology clinic and rx for Flomax, Zofran, and pain medication. Appreciate his recommendations and assistance.   Will send home with Flomax, Keflex, Zofran, and Percocet for pain. Patient will schedule an appointment with [facility] urology clinic. Urine cx pending. Return precautions discussed.   Final Clinical Impressions(s) / ED Diagnoses   Final diagnoses:  Urinary tract infection with hematuria, site unspecified  Left nephrolithiasis    New Prescriptions New Prescriptions   CEPHALEXIN (KEFLEX) 500 MG CAPSULE    Take 1 capsule (500 mg total) by mouth 2 (two) times daily.   ONDANSETRON (ZOFRAN) 4 MG TABLET    Take 1 tablet (4 mg total) by mouth every 8 (eight) hours as needed for nausea or vomiting.   OXYCODONE-ACETAMINOPHEN (ROXICET) 5-325 MG TABLET    Take 1 tablet by mouth every 6 (six) hours as needed for severe pain.   TAMSULOSIN (FLOMAX) 0.4 MG CAPS CAPSULE    Take 1 capsule (0.4 mg total) by mouth daily.     Beaulah Dinning, MD 02/25/16 1117    Raeford Razor, MD 02/29/16 1046

## 2016-02-27 LAB — URINE CULTURE: Culture: >=100000 — AB

## 2016-02-28 ENCOUNTER — Telehealth (HOSPITAL_COMMUNITY): Payer: Self-pay

## 2016-02-28 NOTE — Progress Notes (Signed)
ED Antimicrobial Stewardship Positive Culture Follow Up   [name] is an 75 y.o. female who presented to [facility] on 02/25/2016 with a chief complaint of  Chief Complaint  Patient presents with  . Abdominal Pain  . Urinary Frequency    Recent Results (from the past 720 hour(s))  Urine culture     Status: Abnormal   Collection Time: 02/25/16  8:13 AM  Result Value Ref Range Status   Specimen Description URINE, CLEAN CATCH  Final   Special Requests NONE  Final   Culture >=100,000 COLONIES/mL ESCHERICHIA COLI (A)  Final   Report Status 02/27/2016 FINAL  Final   Organism ID, Bacteria ESCHERICHIA COLI (A)  Final      Susceptibility   Escherichia coli - MIC*    AMPICILLIN >=32 RESISTANT Resistant     CEFAZOLIN >=64 RESISTANT Resistant     CEFTRIAXONE >=64 RESISTANT Resistant     CIPROFLOXACIN >=4 RESISTANT Resistant     GENTAMICIN >=16 RESISTANT Resistant     IMIPENEM <=0.25 SENSITIVE Sensitive     NITROFURANTOIN <=16 SENSITIVE Sensitive     TRIMETH/SULFA <=20 SENSITIVE Sensitive     AMPICILLIN/SULBACTAM >=32 RESISTANT Resistant     PIP/TAZO <=4 SENSITIVE Sensitive     Extended ESBL POSITIVE Resistant     * >=100,000 COLONIES/mL ESCHERICHIA COLI    [x]  Treated with Keflex, organism resistant to prescribed antimicrobial.  New antibiotic prescription: Bactrim DS (800mg /160mg ) 1 tab po BID x 7 days  ED Provider: Harolyn Rutherford, PA  Allie Bossier, PharmD PGY1 Pharmacy Resident [phone] (Pager) 02/28/2016 8:50 AM

## 2016-02-28 NOTE — Telephone Encounter (Signed)
Post ED Visit - Positive Culture Follow-up: Successful Patient Follow-Up  Culture assessed and recommendations reviewed by: []  Enzo Bi, Pharm.D. []  Celedonio Miyamoto, Pharm.D., BCPS []  Garvin Fila, Pharm.D. []  Georgina Pillion, Pharm.D., BCPS []  [place], [address].D., BCPS, AAHIVP []  Estella Husk, Pharm.D., BCPS, AAHIVP []  Tennis Must, Pharm.D. []  Sherle Poe, [address]D. Ladona Horns, Pharm.D.   Positive urine culture, >/= 100,000 colonies -> E Coli  []  Patient discharged without antimicrobial prescription and treatment is now indicated [x]  Organism is resistant to prescribed ED discharge antimicrobial (Cephalexin) []  Patient with positive blood cultures  Changes discussed with ED provider: Harolyn Rutherford PA New antibiotic prescription "Bactrim DS 1 tablet po BID x 7 days"   Called to [facility] [phone] and given to RPh  Contacted patient, date 02/28/16, time 1242 Pt informed of need for change in prescription and to stop Keflex.  Rx called and given to [facility] @ [facility] [phone].      Arvid Right 02/28/2016, 12:40 PM

## 2016-03-02 DIAGNOSIS — N200 Calculus of kidney: Secondary | ICD-10-CM | POA: Diagnosis not present

## 2016-03-02 DIAGNOSIS — J069 Acute upper respiratory infection, unspecified: Secondary | ICD-10-CM | POA: Diagnosis not present

## 2016-03-30 DIAGNOSIS — R350 Frequency of micturition: Secondary | ICD-10-CM | POA: Diagnosis not present

## 2016-03-30 DIAGNOSIS — N200 Calculus of kidney: Secondary | ICD-10-CM | POA: Diagnosis not present

## 2016-03-30 DIAGNOSIS — R05 Cough: Secondary | ICD-10-CM | POA: Diagnosis not present

## 2016-04-20 ENCOUNTER — Emergency Department (HOSPITAL_COMMUNITY): Payer: Medicare Other

## 2016-04-20 ENCOUNTER — Other Ambulatory Visit: Payer: Self-pay

## 2016-04-20 ENCOUNTER — Observation Stay (HOSPITAL_COMMUNITY)
Admission: EM | Admit: 2016-04-20 | Discharge: 2016-04-23 | Disposition: A | Discharge status: Home / Self Care | Payer: Medicare Other | Attending: Internal Medicine | Admitting: Internal Medicine

## 2016-04-20 ENCOUNTER — Encounter (HOSPITAL_COMMUNITY): Payer: Self-pay | Admitting: Emergency Medicine

## 2016-04-20 DIAGNOSIS — R000 Tachycardia, unspecified: Secondary | ICD-10-CM | POA: Diagnosis not present

## 2016-04-20 DIAGNOSIS — R05 Cough: Secondary | ICD-10-CM | POA: Diagnosis not present

## 2016-04-20 DIAGNOSIS — E785 Hyperlipidemia, unspecified: Secondary | ICD-10-CM | POA: Diagnosis present

## 2016-04-20 DIAGNOSIS — E039 Hypothyroidism, unspecified: Secondary | ICD-10-CM | POA: Diagnosis not present

## 2016-04-20 DIAGNOSIS — I110 Hypertensive heart disease with heart failure: Secondary | ICD-10-CM | POA: Insufficient documentation

## 2016-04-20 DIAGNOSIS — J209 Acute bronchitis, unspecified: Secondary | ICD-10-CM | POA: Diagnosis not present

## 2016-04-20 DIAGNOSIS — R0789 Other chest pain: Secondary | ICD-10-CM | POA: Diagnosis not present

## 2016-04-20 DIAGNOSIS — I2510 Atherosclerotic heart disease of native coronary artery without angina pectoris: Secondary | ICD-10-CM | POA: Diagnosis not present

## 2016-04-20 DIAGNOSIS — E784 Other hyperlipidemia: Secondary | ICD-10-CM

## 2016-04-20 DIAGNOSIS — J069 Acute upper respiratory infection, unspecified: Secondary | ICD-10-CM | POA: Diagnosis not present

## 2016-04-20 DIAGNOSIS — Z7982 Long term (current) use of aspirin: Secondary | ICD-10-CM | POA: Diagnosis not present

## 2016-04-20 DIAGNOSIS — R2689 Other abnormalities of gait and mobility: Secondary | ICD-10-CM

## 2016-04-20 DIAGNOSIS — Z79899 Other long term (current) drug therapy: Secondary | ICD-10-CM | POA: Insufficient documentation

## 2016-04-20 DIAGNOSIS — I509 Heart failure, unspecified: Secondary | ICD-10-CM | POA: Diagnosis not present

## 2016-04-20 DIAGNOSIS — I471 Supraventricular tachycardia: Secondary | ICD-10-CM | POA: Diagnosis not present

## 2016-04-20 DIAGNOSIS — R079 Chest pain, unspecified: Secondary | ICD-10-CM | POA: Diagnosis not present

## 2016-04-20 DIAGNOSIS — E038 Other specified hypothyroidism: Secondary | ICD-10-CM | POA: Diagnosis not present

## 2016-04-20 LAB — CBC WITH DIFFERENTIAL/PLATELET
BASOS PCT: 1 %
Basophils Absolute: 0.1 10*3/uL (ref 0.0–0.1)
Eosinophils Absolute: 0.2 10*3/uL (ref 0.0–0.7)
Eosinophils Relative: 1 %
HEMATOCRIT: 37.5 % (ref 36.0–46.0)
HEMOGLOBIN: 12.4 g/dL (ref 12.0–15.0)
LYMPHS PCT: 24 %
Lymphs Abs: 3 10*3/uL (ref 0.7–4.0)
MCH: 29.8 pg (ref 26.0–34.0)
MCHC: 33.1 g/dL (ref 30.0–36.0)
MCV: 90.1 fL (ref 78.0–100.0)
Monocytes Absolute: 1.6 10*3/uL — ABNORMAL HIGH (ref 0.1–1.0)
Monocytes Relative: 13 %
NEUTROS ABS: 7.8 10*3/uL — AB (ref 1.7–7.7)
NEUTROS PCT: 61 %
Platelets: 358 10*3/uL (ref 150–400)
RBC: 4.16 MIL/uL (ref 3.87–5.11)
RDW: 12.6 % (ref 11.5–15.5)
WBC: 12.7 10*3/uL — AB (ref 4.0–10.5)

## 2016-04-20 LAB — TSH: TSH: 3.592 u[IU]/mL (ref 0.350–4.500)

## 2016-04-20 LAB — COMPREHENSIVE METABOLIC PANEL
ALBUMIN: 3.8 g/dL (ref 3.5–5.0)
ALT: 37 U/L (ref 14–54)
AST: 58 U/L — ABNORMAL HIGH (ref 15–41)
Alkaline Phosphatase: 147 U/L — ABNORMAL HIGH (ref 38–126)
Anion gap: 12 (ref 5–15)
BILIRUBIN TOTAL: 0.8 mg/dL (ref 0.3–1.2)
BUN: 22 mg/dL — AB (ref 6–20)
CO2: 23 mmol/L (ref 22–32)
CREATININE: 1.07 mg/dL — AB (ref 0.44–1.00)
Calcium: 9.6 mg/dL (ref 8.9–10.3)
Chloride: 101 mmol/L (ref 101–111)
GFR calc Af Amer: 58 mL/min — ABNORMAL LOW (ref >60)
GFR, EST NON AFRICAN AMERICAN: 50 mL/min — AB (ref >60)
GLUCOSE: 106 mg/dL — AB (ref 65–99)
Potassium: 3.4 mmol/L — ABNORMAL LOW (ref 3.5–5.1)
Sodium: 136 mmol/L (ref 135–145)
TOTAL PROTEIN: 7.8 g/dL (ref 6.5–8.1)

## 2016-04-20 LAB — TROPONIN I: Troponin I: <0.03 ng/mL (ref <0.03)

## 2016-04-20 MED ORDER — AZITHROMYCIN 250 MG PO TABS
500.0000 mg | ORAL_TABLET | Freq: Every day | ORAL | Status: DC
  Administered 2016-04-20 – 2016-04-23 (×4): 500 mg via ORAL
  Filled 2016-04-20 (×4): qty 2

## 2016-04-20 MED ORDER — LISINOPRIL 10 MG PO TABS
20.0000 mg | ORAL_TABLET | Freq: Two times a day (BID) | ORAL | Status: DC
  Administered 2016-04-21 – 2016-04-23 (×3): 20 mg via ORAL
  Filled 2016-04-20 (×7): qty 2

## 2016-04-20 MED ORDER — PRAVASTATIN SODIUM 40 MG PO TABS
40.0000 mg | ORAL_TABLET | Freq: Every day | ORAL | Status: DC
  Administered 2016-04-21 – 2016-04-22 (×3): 40 mg via ORAL
  Filled 2016-04-20 (×4): qty 1

## 2016-04-20 MED ORDER — DILTIAZEM HCL ER COATED BEADS 120 MG PO CP24
120.0000 mg | ORAL_CAPSULE | Freq: Every day | ORAL | Status: DC
  Administered 2016-04-21: 120 mg via ORAL
  Filled 2016-04-20 (×2): qty 1

## 2016-04-20 MED ORDER — ASPIRIN 81 MG PO CHEW
324.0000 mg | CHEWABLE_TABLET | Freq: Once | ORAL | Status: AC
  Administered 2016-04-20: 324 mg via ORAL
  Filled 2016-04-20: qty 4

## 2016-04-20 MED ORDER — TAMSULOSIN HCL 0.4 MG PO CAPS
0.4000 mg | ORAL_CAPSULE | Freq: Every day | ORAL | Status: DC
  Administered 2016-04-20 – 2016-04-23 (×4): 0.4 mg via ORAL
  Filled 2016-04-20 (×4): qty 1

## 2016-04-20 MED ORDER — GUAIFENESIN-DM 100-10 MG/5ML PO SYRP
5.0000 mL | ORAL_SOLUTION | ORAL | Status: DC | PRN
  Administered 2016-04-22 – 2016-04-23 (×2): 5 mL via ORAL
  Filled 2016-04-20 (×2): qty 5

## 2016-04-20 MED ORDER — POTASSIUM CHLORIDE CRYS ER 20 MEQ PO TBCR
40.0000 meq | EXTENDED_RELEASE_TABLET | Freq: Once | ORAL | Status: AC
  Administered 2016-04-20: 40 meq via ORAL
  Filled 2016-04-20: qty 2

## 2016-04-20 MED ORDER — FUROSEMIDE 20 MG PO TABS: 20.0000 mg | ORAL_TABLET | Freq: Every day | ORAL | Status: DC | PRN

## 2016-04-20 MED ORDER — LEVALBUTEROL HCL 0.63 MG/3ML IN NEBU
0.6300 mg | INHALATION_SOLUTION | Freq: Three times a day (TID) | RESPIRATORY_TRACT | Status: DC
  Administered 2016-04-20 – 2016-04-22 (×5): 0.63 mg via RESPIRATORY_TRACT
  Filled 2016-04-20 (×5): qty 3

## 2016-04-20 MED ORDER — FENTANYL CITRATE (PF) 100 MCG/2ML IJ SOLN
50.0000 ug | Freq: Once | INTRAMUSCULAR | Status: AC
  Administered 2016-04-20: 50 ug via INTRAVENOUS
  Filled 2016-04-20: qty 2

## 2016-04-20 MED ORDER — PREDNISONE 20 MG PO TABS
40.0000 mg | ORAL_TABLET | Freq: Every day | ORAL | Status: AC
  Administered 2016-04-21 – 2016-04-23 (×3): 40 mg via ORAL
  Filled 2016-04-20 (×3): qty 2

## 2016-04-20 MED ORDER — ADULT MULTIVITAMIN W/MINERALS CH
1.0000 | ORAL_TABLET | Freq: Every day | ORAL | Status: DC
  Administered 2016-04-20 – 2016-04-23 (×4): 1 via ORAL
  Filled 2016-04-20 (×4): qty 1

## 2016-04-20 MED ORDER — ISOSORBIDE MONONITRATE ER 60 MG PO TB24
30.0000 mg | ORAL_TABLET | Freq: Every day | ORAL | Status: DC
  Administered 2016-04-21: 30 mg via ORAL
  Filled 2016-04-20 (×3): qty 1

## 2016-04-20 MED ORDER — DILTIAZEM HCL ER COATED BEADS 120 MG PO CP24
ORAL_CAPSULE | ORAL | Status: AC
  Filled 2016-04-20: qty 1

## 2016-04-20 MED ORDER — GABAPENTIN 100 MG PO CAPS
100.0000 mg | ORAL_CAPSULE | Freq: Every day | ORAL | Status: DC
  Administered 2016-04-21 – 2016-04-22 (×3): 100 mg via ORAL
  Filled 2016-04-20 (×3): qty 1

## 2016-04-20 MED ORDER — ENOXAPARIN SODIUM 40 MG/0.4ML ~~LOC~~ SOLN
40.0000 mg | SUBCUTANEOUS | Status: DC
  Administered 2016-04-20 – 2016-04-21 (×2): 40 mg via SUBCUTANEOUS
  Filled 2016-04-20 (×2): qty 0.4

## 2016-04-20 MED ORDER — PANTOPRAZOLE SODIUM 40 MG PO TBEC
40.0000 mg | DELAYED_RELEASE_TABLET | Freq: Every day | ORAL | Status: DC
  Administered 2016-04-20 – 2016-04-23 (×4): 40 mg via ORAL
  Filled 2016-04-20 (×4): qty 1

## 2016-04-20 MED ORDER — SODIUM CHLORIDE 0.9 % IV BOLUS (SEPSIS)
500.0000 mL | Freq: Once | INTRAVENOUS | Status: AC
  Administered 2016-04-20: 500 mL via INTRAVENOUS

## 2016-04-20 MED ORDER — ASPIRIN 81 MG PO CHEW
81.0000 mg | CHEWABLE_TABLET | Freq: Every morning | ORAL | Status: DC
  Administered 2016-04-21 – 2016-04-23 (×3): 81 mg via ORAL
  Filled 2016-04-20 (×3): qty 1

## 2016-04-20 NOTE — ED Triage Notes (Signed)
Patient sent over from PCP for heart rate in 140's. Patient complains of shortness of breath. Patient has auditory wheezing at triage. Denies pain at this time.

## 2016-04-20 NOTE — ED Notes (Signed)
Patient states that she is getting hungry asked for nabs to eat. Patient states that she is having pain in her chest, sides and back from coughing so much.

## 2016-04-20 NOTE — ED Provider Notes (Signed)
Emergency Department Provider Note   I have reviewed the triage vital signs and the nursing notes.   HISTORY  Chief Complaint Tachycardia   HPI [name] is a 76 y.o. female with PMH of cardiomyopathy, CHF, CAD, HTN, HLD, and obesity presents to the ED from her PCP with tachycardia. The patient initially presented to the PCP with congestion for the last 4 days. She denies fever, chills, chest pain, or palpitations. The patient was procedure patient yesterday to call today for an earlier appointment because whether concerns. On arrival they took her vital signs and found her heart rate to be elevated and referred her to the emergency department without further evaluation. Patient does note a sensation that her heart is beating faster that has been worse at night especially when she is coughing significantly but denies feeling that way currently. She has no past history of A. Fib. No changes to her medications. No exacerbating or alleviating factors.   Past Medical History:  Diagnosis Date  . Anemia   . Cardiomyopathy (HCC)    LVEF 40-45% 2011  . CHF (congestive heart failure) (HCC)   . Chronic insomnia   . Coronary atherosclerosis of native coronary artery    60% LAD in 2002  . DJD (degenerative joint disease)    Left THA  . Essential hypertension, benign   . GERD (gastroesophageal reflux disease)   . Hyperlipidemia   . Hypothyroidism   . Left bundle branch block   . Nephrolithiasis   . Obesity     Patient Active Problem List   Diagnosis Date Noted  . 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 Diverticulitis 07/06/2013  . Rectal bleeding 12/29/2012  . Coronary atherosclerosis of native coronary artery   . DJD (degenerative joint disease)   . HYPOTHYROIDISM 12/05/2008  . Hyperlipidemia 12/05/2008  . Essential hypertension, benign 12/05/2008    Past Surgical History:  Procedure Laterality Date  . ABDOMINAL HYSTERECTOMY     (partial)  . APPENDECTOMY    .  CHOLECYSTECTOMY    . COLONOSCOPY  Aug 2013   Dr. Loreta Ave, [facility]: small internal hemorrhoids and few scattered sigmoid diverticula, otherwise normal up to TI, TI appeared normal  . ESOPHAGOGASTRODUODENOSCOPY N/A 06/01/2012   RMR:5 cm hiatal hernia. Essentially normal esophagus  . HEMORROIDECTOMY    . JOINT REPLACEMENT    . NOSE SURGERY    . REVISION TOTAL HIP ARTHROPLASTY  2009   Left THA 2009    Current Outpatient Rx  . Order #: [id number] Class: Historical Med  . Order #: [id number] Class: Historical Med  . Order #: [id number] Class: Historical Med  . Order #: [id number] Class: Historical Med  . Order #: [id number] Class: Historical Med  . Order #: [id number] Class: Normal  . Order #: [id number] Class: Historical Med  . Order #: [id number] Class: Historical Med  . Order #: [id number] Class: Historical Med  . Order #: [id number] Class: Historical Med  . Order #: [id number] Class: Historical Med  . Order #: [id number] Class: Print  . Order #: [id number] Class: Print  . Order #: [id number] Class: Print    Allergies Codeine and Tape  Family History  Problem Relation Age of Onset  . Colon cancer Neg Hx     Social History Social History  Substance Use Topics  . Smoking status: Never Smoker  . Smokeless tobacco: Never Used  . Alcohol use No    Review of Systems  Constitutional: No fever/chills Eyes: No visual changes. ENT: No sore throat. Cardiovascular: Denies chest pain. No palpitations.  Respiratory: Denies  shortness of breath. Positive cough and congestion.  Gastrointestinal: No abdominal pain.  No nausea, no vomiting.  No diarrhea.  No constipation. Genitourinary: Negative for dysuria. Musculoskeletal: Negative for back pain. Skin: Negative for rash. Neurological: Negative for headaches, focal weakness or numbness.  10-point ROS otherwise negative.  ____________________________________________   PHYSICAL EXAM:  VITAL SIGNS: ED Triage Vitals [04/20/16 1457]  Enc Vitals  Group     BP 121/77     Pulse Rate (!) 147     Resp 22     Temp 97.8 F (36.6 C)     Temp Source Oral     SpO2 97 %     Weight 205 lb (93 kg)     Height 5\' 3"  (1.6 m)   Constitutional: Alert and oriented. Well appearing and in no acute distress. Eyes: Conjunctivae are normal.  Head: Atraumatic. Nose: No congestion/rhinnorhea. Mouth/Throat: Mucous membranes are moist.  Oropharynx non-erythematous. Neck: No stridor.  Cardiovascular: Tachycardia. Good peripheral circulation. Grossly normal heart sounds.   Respiratory: Normal respiratory effort.  No retractions. Lungs CTAB. Gastrointestinal: Soft and nontender. No distention.  Musculoskeletal: No lower extremity tenderness nor edema. No gross deformities of extremities. Neurologic:  Normal speech and language. No gross focal neurologic deficits are appreciated.  Skin:  Skin is warm, dry and intact. No rash noted.  ____________________________________________   LABS (all labs ordered are listed, but only abnormal results are displayed)  Labs Reviewed  CBC WITH DIFFERENTIAL/PLATELET - Abnormal; Notable for the following:       Result Value   WBC 12.7 (*)    Neutro Abs 7.8 (*)    Monocytes Absolute 1.6 (*)    All other components within normal limits  COMPREHENSIVE METABOLIC PANEL - Abnormal; Notable for the following:    Potassium 3.4 (*)    Glucose, Bld 106 (*)    BUN 22 (*)    Creatinine, Ser 1.07 (*)    AST 58 (*)    Alkaline Phosphatase 147 (*)    GFR calc non Af Amer 50 (*)    GFR calc Af Amer 58 (*)    All other components within normal limits  TROPONIN I  TSH   ____________________________________________  EKG   EKG Interpretation  Date/Time:  Monday April 20 2016 14:58:51 EST Ventricular Rate:  135 PR Interval:    QRS Duration: 132 QT Interval:  324 QTC Calculation: 486 R Axis:   -57 Text Interpretation:  Atrial fibrillation with rapid ventricular response Left axis deviation Left bundle branch block  Abnormal ECG No STEMI. Similar  LBBB to prior.  Confirmed by Clerence Gubser MD, Gemma Ruan [phone]) on 04/20/2016 3:07:48 PM       ____________________________________________  RADIOLOGY  Dg Chest 2 View  Result Date: 04/20/2016 CLINICAL DATA:  76 year old female with cough for 4 days. Fever chest and back pain. Initial encounter. EXAM: CHEST  2 VIEW COMPARISON:  817 and earlier. FINDINGS: Upright AP and lateral views of the chest. Stable lung volumes. Stable mild cardiomegaly. Other mediastinal contours are within normal limits. Small calcified hilar lymph nodes on the left again noted. Small left upper lobe calcified granuloma associated. No pneumothorax, pulmonary edema, pleural effusion or other confluent pulmonary opacity. No acute osseous abnormality identified. Stable cholecystectomy clips. IMPRESSION: No acute cardiopulmonary abnormality. Mild cardiomegaly. Chronic left lung granulomatous changes. Electronically Signed   By: Odessa Fleming M.D.   On: 04/20/2016 17:12    ____________________________________________   PROCEDURES  Procedure(s) performed:   Procedures  None ____________________________________________  INITIAL IMPRESSION / ASSESSMENT AND PLAN / ED COURSE  Pertinent labs & imaging results that were available during my care of the patient were reviewed by me and considered in my medical decision making (see chart for details).  Patient resents to the emergency department for evaluation of tachycardia. She initially presented to her PCP office with congestion and cough. She is found to have an irregular tachycardia. Patient's EKG shows left axis deviation and left bundle branch block but new A. fib with RVR. The patient is asymptomatic at this time. Unclear how [place] she's been on this rhythm. Not feel she is a good candidate for cardioversion given this history. Plan for IV fluids and labs including thyroid studies given the patient's medical history.   03:29 PM Patient now is irregular  sinus rhythm. Repeat EKG obtained. Labs and imaging pending.   05:15 PM No PNA on CXR. CHADSVASc score of 5. Paged cardiology to discuss. Irregular sinus rhythm at this time.   06:10 PM Spoke with Dr. Eldridge Dace with Cardiology regarding the patient's EKG. Unclear if this is true a-fib with RVR or MAT. Underlying sinus rhythm is also irregular to unclear if when going faster on initial presentation represents true a-fib. No anticoagulation at this time. Patient had some new onset chest pain since arrival in the ED. CP now resolved. Plan to discuss admission with hospitalist.   07:05 PM Discussed patient's case with Hospitalist, Dr. Conley Rolls. He will be down to see the patient and place orders. Patient and family (if present) updated with plan. Care transferred to hosptialist service.  I reviewed all nursing notes, vitals, pertinent old records, EKGs, labs, imaging (as available).  ____________________________________________  FINAL CLINICAL IMPRESSION(S) / ED DIAGNOSES  Final diagnoses:  Chest pain, unspecified type     MEDICATIONS GIVEN DURING THIS VISIT:  Medications  aspirin chewable tablet 324 mg (not administered)  sodium chloride 0.9 % bolus 500 mL (0 mLs Intravenous Stopped 04/20/16 1758)  fentaNYL (SUBLIMAZE) injection 50 mcg (50 mcg Intravenous Given 04/20/16 1806)     NEW OUTPATIENT MEDICATIONS STARTED DURING THIS VISIT:  None   Note:  This document was prepared using Dragon voice recognition software and may include unintentional dictation errors.  Alona Bene, MD Emergency Medicine   Maia Plan, MD 04/20/16 [phone]

## 2016-04-20 NOTE — H&P (Signed)
History and Physical    [name] ZOX:[id number] DOB: [birth year] DOA: 04/20/2016  PCP: [name], MD  Patient coming from: PCP's office, from home.    Chief Complaint:  Tachycardia and chest pain.   HPI: [name] is an 76 y.o. female with hx of CMP, HTN, CAD, hypohyroidism, HLD, GERD, LBBB, presented to the PCP office for routine follow up today, found to be having tachycardia at 140 and sent into the ER.  She did have chest pain with coughs, and having some wheezing.  Her coughs have been for a week now, and she had no fever, chills, or myalgia. Her cough was productive.  She was found to have PSVT, though it was irregular, and was suggestive of afib with RVR.  Close looking at her EKG however, in V1, there are distinct p waves.  Cardiology was consulted, and did not think her episode was afib.  In the ER other work up included a mild leukocytosis with WBC of 12K, normal renal fx test and K of 3.4.  Her CXR did not show any infiltrate or congestion.  Hospitalist was asked to admit her for cardiac r/out and to monitor her rhythm.    ED Course:  See above.  Rewiew of Systems:  Constitutional: Negative for malaise, fever and chills. No significant weight loss or weight gain Eyes: Negative for eye pain, redness and discharge, diplopia, visual changes, or flashes of light. ENMT: Negative for ear pain, hoarseness, nasal congestion, sinus pressure and sore throat. No headaches; tinnitus, drooling, or problem swallowing. Cardiovascular: Negative for chest pain, palpitations, diaphoresis, dyspnea and peripheral edema. ; No orthopnea, PND Respiratory: Negative for cough, hemoptysis, wheezing and stridor. No pleuritic chestpain. Gastrointestinal: Negative for diarrhea, constipation,  melena, blood in stool, hematemesis, jaundice and rectal bleeding.    Genitourinary: Negative for frequency, dysuria, incontinence,flank pain and hematuria; Musculoskeletal: Negative for back pain and  neck pain. Negative for swelling and trauma.;  Skin: . Negative for pruritus, rash, abrasions, bruising and skin lesion.; ulcerations Neuro: Negative for headache, lightheadedness and neck stiffness. Negative for weakness, altered level of consciousness , altered mental status, extremity weakness, burning feet, involuntary movement, seizure and syncope.  Psych: negative for anxiety, depression, insomnia, tearfulness, panic attacks, hallucinations, paranoia, suicidal or homicidal ideation    Past Medical History:  Diagnosis Date  . Anemia   . Cardiomyopathy (HCC)    LVEF 40-45% 2011  . CHF (congestive heart failure) (HCC)   . Chronic insomnia   . Coronary atherosclerosis of native coronary artery    60% LAD in 2002  . DJD (degenerative joint disease)    Left THA  . Essential hypertension, benign   . GERD (gastroesophageal reflux disease)   . Hyperlipidemia   . Hypothyroidism   . Left bundle branch block   . Nephrolithiasis   . Obesity     Past Surgical History:  Procedure Laterality Date  . ABDOMINAL HYSTERECTOMY     (partial)  . APPENDECTOMY    . CHOLECYSTECTOMY    . COLONOSCOPY  Aug 2013   Dr. Loreta Ave, [facility]: small internal hemorrhoids and few scattered sigmoid diverticula, otherwise normal up to TI, TI appeared normal  . ESOPHAGOGASTRODUODENOSCOPY N/A 06/01/2012   RMR:5 cm hiatal hernia. Essentially normal esophagus  . HEMORROIDECTOMY    . JOINT REPLACEMENT    . NOSE SURGERY    . REVISION TOTAL HIP ARTHROPLASTY  2009   Left THA 2009     reports that she has  never smoked. She has never used smokeless tobacco. She reports that she does not drink alcohol or use drugs.  Allergies  Allergen Reactions  . Codeine Itching    "feel like going buggy"  Itchy skin  . Tape Itching    Nylon tape makes pt feel itchy    Family History  Problem Relation Age of Onset  . Colon cancer Neg Hx      Prior to Admission medications   Medication Sig Start Date End  Date Taking? Authorizing Provider  amLODipine (NORVASC) 10 MG tablet Take 10 mg by mouth every morning.    Yes Historical Provider, MD  aspirin 81 MG tablet Take 81 mg by mouth every morning.    Yes Historical Provider, MD  Calcium Carbonate-Vitamin D (CALCIUM 600 + D PO) Take 1 tablet by mouth 2 (two) times daily.   Yes Historical Provider, MD  furosemide (LASIX) 40 MG tablet Take 20 mg by mouth daily as needed for fluid or edema.    Yes Historical Provider, MD  gabapentin (NEURONTIN) 100 MG capsule Take 100 mg by mouth at bedtime.   Yes Historical Provider, MD  isosorbide mononitrate (IMDUR) 30 MG 24 hr tablet Take 1 tablet (30 mg total) by mouth daily. Patient taking differently: Take 30 mg by mouth at bedtime.  08/29/15  Yes Jonelle Sidle, MD  lisinopril (PRINIVIL,ZESTRIL) 20 MG tablet Take 20 mg by mouth 2 (two) times daily.    Yes Historical Provider, MD  Multiple Vitamin (MULTIVITAMIN WITH MINERALS) TABS Take 1 tablet by mouth daily.   Yes Historical Provider, MD  nitroGLYCERIN (NITROSTAT) 0.4 MG SL tablet Place 0.4 mg under the tongue every 5 (five) minutes as needed. For chest pain    Yes Historical Provider, MD  omeprazole (PRILOSEC) 20 MG capsule Take 40 mg by mouth every morning.    Yes Historical Provider, MD  pravastatin (PRAVACHOL) 40 MG tablet Take 40 mg by mouth at bedtime.    Yes Historical Provider, MD  ondansetron (ZOFRAN) 4 MG tablet Take 1 tablet (4 mg total) by mouth every 8 (eight) hours as needed for nausea or vomiting. Patient not taking: Reported on 04/20/2016 02/25/16   Beaulah Dinning, MD  oxyCODONE-acetaminophen (ROXICET) 5-325 MG tablet Take 1 tablet by mouth every 6 (six) hours as needed for severe pain. Patient not taking: Reported on 04/20/2016 02/25/16   Beaulah Dinning, MD  tamsulosin (FLOMAX) 0.4 MG CAPS capsule Take 1 capsule (0.4 mg total) by mouth daily. Patient not taking: Reported on 04/20/2016 02/25/16   Beaulah Dinning, MD    Physical  Exam: Vitals:   04/20/16 1457 04/20/16 1523 04/20/16 1900  BP: 121/77 (!) 120/50 (!) 104/39  Pulse: (!) 147 86 86  Resp: 22 22 19   Temp: 97.8 F (36.6 C)    TempSrc: Oral    SpO2: 97% 99% 97%  Weight: 93 kg (205 lb)    Height: 5\' 3"  (1.6 m)        Constitutional: NAD, calm, comfortable Vitals:   04/20/16 1457 04/20/16 1523 04/20/16 1900  BP: 121/77 (!) 120/50 (!) 104/39  Pulse: (!) 147 86 86  Resp: 22 22 19   Temp: 97.8 F (36.6 C)    TempSrc: Oral    SpO2: 97% 99% 97%  Weight: 93 kg (205 lb)    Height: 5\' 3"  (1.6 m)     Eyes: PERRL, lids and conjunctivae normal ENMT: Mucous membranes are moist. Posterior pharynx clear of any exudate or lesions.Normal dentition.  Neck: normal, supple, no masses, no thyromegaly Respiratory: wheezing bilaterally with no rales. No accessory muscle use.  Cardiovascular: Regular rate and rhythm, no murmurs / rubs / gallops. No extremity edema. 2+ pedal pulses. No carotid bruits.  Abdomen: no tenderness, no masses palpated. No hepatosplenomegaly. Bowel sounds positive.  Musculoskeletal: no clubbing / cyanosis. No joint deformity upper and lower extremities. Good ROM, no contractures. Normal muscle tone.  Skin: no rashes, lesions, ulcers. No induration Neurologic: CN 2-12 grossly intact. Sensation intact, DTR normal. Strength 5/5 in all 4.  Psychiatric: Normal judgment and insight. Alert and oriented x 3. Normal mood.     Labs on Admission: I have personally reviewed following labs and imaging studies  CBC:  Recent Labs Lab 04/20/16 1514  WBC 12.7*  NEUTROABS 7.8*  HGB 12.4  HCT 37.5  MCV 90.1  PLT 358   Basic Metabolic Panel:  Recent Labs Lab 04/20/16 1514  NA 136  K 3.4*  CL 101  CO2 23  GLUCOSE 106*  BUN 22*  CREATININE 1.07*  CALCIUM 9.6   GFR: Estimated Creatinine Clearance: 50.7 mL/min (by C-G formula based on SCr of 1.07 mg/dL (H)). Liver Function Tests:  Recent Labs Lab 04/20/16 1514  AST 58*  ALT 37   ALKPHOS 147*  BILITOT 0.8  PROT 7.8  ALBUMIN 3.8    Cardiac Enzymes:  Recent Labs Lab 04/20/16 1514  TROPONINI <0.03   Thyroid Function Tests:  Recent Labs  04/20/16 1515  TSH 3.592   Urine analysis:    Component Value Date/Time   COLORURINE YELLOW 02/25/2016 0810   APPEARANCEUR HAZY (A) 02/25/2016 0810   LABSPEC 1.020 02/25/2016 0810   PHURINE 6.0 02/25/2016 0810   GLUCOSEU NEGATIVE 02/25/2016 0810   HGBUR SMALL (A) 02/25/2016 0810   BILIRUBINUR NEGATIVE 02/25/2016 0810   KETONESUR NEGATIVE 02/25/2016 0810   PROTEINUR NEGATIVE 02/25/2016 0810   UROBILINOGEN 1.0 07/06/2013 1425   NITRITE NEGATIVE 02/25/2016 0810   LEUKOCYTESUR LARGE (A) 02/25/2016 0810   Radiological Exams on Admission: Dg Chest 2 View  Result Date: 04/20/2016 CLINICAL DATA:  76 year old female with cough for 4 days. Fever chest and back pain. Initial encounter. EXAM: CHEST  2 VIEW COMPARISON:  817 and earlier. FINDINGS: Upright AP and lateral views of the chest. Stable lung volumes. Stable mild cardiomegaly. Other mediastinal contours are within normal limits. Small calcified hilar lymph nodes on the left again noted. Small left upper lobe calcified granuloma associated. No pneumothorax, pulmonary edema, pleural effusion or other confluent pulmonary opacity. No acute osseous abnormality identified. Stable cholecystectomy clips. IMPRESSION: No acute cardiopulmonary abnormality. Mild cardiomegaly. Chronic left lung granulomatous changes. Electronically Signed   By: Odessa Fleming M.D.   On: 04/20/2016 17:12    EKG: Independently reviewed.  Assessment/Plan Active Problems:   Hypothyroidism   Hyperlipidemia   Coronary atherosclerosis of native coronary artery   PSVT (paroxysmal supraventricular tachycardia) (HCC)   URI (upper respiratory infection)    PLAN:   PSVT:  I don't think this was afib with RVR either.  Her anti HTN med included Norvasc, so will change it to diltiazem CD at 120mg  per day.   Continue with ASA.  No indication for full anticoagulation.  Obtain ECHO and place on monitor.  Atypical CP:  WIll continue with her statin and ASA.  Cycle troponins and await ECHO result.  HTN:  Will change Norvasc which can cause tachycardia to Diltiazem.  Monitor BP.  Hypokalemia:  Slightly.  Will continue oral supplementation.  Hypothyrodism:  Continue with supplement.  Check TSH.  URI:  She has bronchitis.  Will give Zpak, along with Xopenex and oral Prednisone.  Will give Robitussin DM as she is allergic to codeine for antitussive.      DVT prophylaxis: Levonox Code Status: Full Code.  Family Communication: None.   Disposition Plan: Home when appropriate.  Consults called: Cardiology at [facility] over the phone by EDP.  Admission status: OBS>    Davis Ambrosini MD FACP. [facility]  If 7PM-7AM, please contact night-coverage [email or web address] Password [facility]  04/20/2016, 7:46 PM

## 2016-04-21 ENCOUNTER — Observation Stay (HOSPITAL_BASED_OUTPATIENT_CLINIC_OR_DEPARTMENT_OTHER): Payer: Medicare Other

## 2016-04-21 DIAGNOSIS — I110 Hypertensive heart disease with heart failure: Secondary | ICD-10-CM | POA: Diagnosis not present

## 2016-04-21 DIAGNOSIS — E039 Hypothyroidism, unspecified: Secondary | ICD-10-CM | POA: Diagnosis not present

## 2016-04-21 DIAGNOSIS — R079 Chest pain, unspecified: Secondary | ICD-10-CM

## 2016-04-21 DIAGNOSIS — I471 Supraventricular tachycardia: Secondary | ICD-10-CM | POA: Diagnosis not present

## 2016-04-21 DIAGNOSIS — E038 Other specified hypothyroidism: Secondary | ICD-10-CM | POA: Diagnosis not present

## 2016-04-21 DIAGNOSIS — I2510 Atherosclerotic heart disease of native coronary artery without angina pectoris: Secondary | ICD-10-CM | POA: Diagnosis not present

## 2016-04-21 DIAGNOSIS — I509 Heart failure, unspecified: Secondary | ICD-10-CM | POA: Diagnosis not present

## 2016-04-21 LAB — ECHOCARDIOGRAM COMPLETE
Height: 63 in
WEIGHTICAEL: 3280 [oz_av]

## 2016-04-21 LAB — COMPREHENSIVE METABOLIC PANEL
ALBUMIN: 3.2 g/dL — AB (ref 3.5–5.0)
ALK PHOS: 128 U/L — AB (ref 38–126)
ALT: 32 U/L (ref 14–54)
AST: 45 U/L — ABNORMAL HIGH (ref 15–41)
Anion gap: 10 (ref 5–15)
BUN: 19 mg/dL (ref 6–20)
CHLORIDE: 108 mmol/L (ref 101–111)
CO2: 22 mmol/L (ref 22–32)
CREATININE: 0.91 mg/dL (ref 0.44–1.00)
Calcium: 9.2 mg/dL (ref 8.9–10.3)
GFR calc non Af Amer: >60 mL/min (ref >60)
GLUCOSE: 105 mg/dL — AB (ref 65–99)
Potassium: 3.8 mmol/L (ref 3.5–5.1)
SODIUM: 140 mmol/L (ref 135–145)
Total Bilirubin: 0.6 mg/dL (ref 0.3–1.2)
Total Protein: 6.8 g/dL (ref 6.5–8.1)

## 2016-04-21 LAB — CBC
HCT: 34.5 % — ABNORMAL LOW (ref 36.0–46.0)
HEMOGLOBIN: 11.8 g/dL — AB (ref 12.0–15.0)
MCH: 30.7 pg (ref 26.0–34.0)
MCHC: 34.2 g/dL (ref 30.0–36.0)
MCV: 89.8 fL (ref 78.0–100.0)
PLATELETS: 283 10*3/uL (ref 150–400)
RBC: 3.84 MIL/uL — AB (ref 3.87–5.11)
RDW: 12.6 % (ref 11.5–15.5)
WBC: 10 10*3/uL (ref 4.0–10.5)

## 2016-04-21 LAB — TROPONIN I: Troponin I: <0.03 ng/mL (ref <0.03)

## 2016-04-21 MED ORDER — DILTIAZEM HCL 25 MG/5ML IV SOLN
20.0000 mg | Freq: Once | INTRAVENOUS | Status: AC
  Administered 2016-04-21: 20 mg via INTRAVENOUS

## 2016-04-21 MED ORDER — DEXTROSE 5 % IV SOLN
5.0000 mg/h | INTRAVENOUS | Status: DC
  Administered 2016-04-21: 5 mg/h via INTRAVENOUS
  Filled 2016-04-21: qty 100

## 2016-04-21 MED ORDER — SODIUM CHLORIDE 0.9 % IV BOLUS (SEPSIS)
500.0000 mL | Freq: Once | INTRAVENOUS | Status: AC
  Administered 2016-04-21: 500 mL via INTRAVENOUS

## 2016-04-21 MED ORDER — ISOSORBIDE MONONITRATE ER 60 MG PO TB24
ORAL_TABLET | ORAL | Status: AC
  Filled 2016-04-21: qty 1

## 2016-04-21 MED ORDER — ENSURE ENLIVE PO LIQD
237.0000 mL | Freq: Two times a day (BID) | ORAL | Status: DC
  Administered 2016-04-22: 237 mL via ORAL

## 2016-04-21 MED ORDER — DILTIAZEM HCL 25 MG/5ML IV SOLN
INTRAVENOUS | Status: AC
  Administered 2016-04-21: 20 mg via INTRAVENOUS
  Filled 2016-04-21: qty 5

## 2016-04-21 MED ORDER — SODIUM CHLORIDE 0.9 % IV SOLN
INTRAVENOUS | Status: DC
  Administered 2016-04-21 – 2016-04-22 (×2): via INTRAVENOUS

## 2016-04-21 NOTE — ED Notes (Signed)
Pt given meal

## 2016-04-21 NOTE — Progress Notes (Signed)
Follow up EKG shows afib, MD has been made aware, will continue to monitor.

## 2016-04-21 NOTE — ED Notes (Signed)
Spoke to Dr. Blake Divine concerning for hr 140's, informed her I just gave cardiezem po 120 mg.  MD would like to wait 30 minutes to see if PO cardiezem will help pt and to text/page if rate does not come down.  Dr. Blake Divine also requested EKG, which one was just captured and exported, MD informed.

## 2016-04-21 NOTE — ED Notes (Signed)
Pt assisted to BR via wheelchair  

## 2016-04-21 NOTE — Progress Notes (Signed)
PROGRESS NOTE    [name]  MCE:[id number] DOB: Apr 26, 1940 DOA: 04/20/2016 PCP: [name], MD    Brief Narrative: [name] is an 76 y.o. female with hx of CMP, HTN, CAD, hypohyroidism, HLD, GERD, LBBB, presented to the PCP office for routine follow up , found to be having tachycardia at 140 and sent into the ER for further evaluation.   Assessment & Plan:   Active Problems:   Hypothyroidism   Hyperlipidemia   Coronary atherosclerosis of native coronary artery   PSVT (paroxysmal supraventricular tachycardia) (HCC)   URI (upper respiratory infection)   PSVT vs ATRIAL flutter: Rate better controlled. Echo done.  Repeat EKG Tonight. Serial troponins . Cardiology consulted by EDP.  Resume aspirin.    Chest pain: resolved. Negative troponins.  Currently on aspirin. Echo shows  LV Systolic function was   difficult to assess due to tachycardia, but appeared grossly   normal. The estimated ejection fraction was in the range of 50%   to 55%. Features are consistent with a pseudonormal left   ventricular filling pattern, with concomitant abnormal relaxation   and increased filling pressure (grade 2 diastolic dysfunction).   Doppler parameters are consistent with high ventricular filling   pressure. Mild to moderate concentric LVH.   Hypokalemia: repleted.    Hypothyroidism: tsh within normal limits. Resume synthroid.   Bronchitis:  On z pack and prednisone taper.      DVT prophylaxis: (Lovenox/) Code Status: (Full Family Communication: none at bedside.  Disposition Plan: pending further eval after PT.   Consultants:  Cardiology.   Procedures: Echocardiogram.    Antimicrobials: (none.    Subjective: Denies any chest pain.   Objective: Vitals:   04/21/16 1443 04/21/16 1530 04/21/16 1546 04/21/16 1628  BP: (!) 94/41 (!) 81/42 (!) 88/46 (!) 91/37  Pulse: 109 102 104 (!) 106  Resp: 23 (!) 27 21 20   Temp: 97.8 F (36.6 C)   98.4 F (36.9 C)    TempSrc: Oral   Oral  SpO2: 98% 96% 95% 98%  Weight:    93.2 kg (205 lb 6.4 oz)  Height:    5\' 3"  (1.6 m)   No intake or output data in the 24 hours ending 04/21/16 1906 Filed Weights   04/20/16 1457 04/21/16 1628  Weight: 93 kg (205 lb) 93.2 kg (205 lb 6.4 oz)    Examination:  General exam: Appears calm and comfortable  Respiratory system: Clear to auscultation. Respiratory effort normal. Cardiovascular system: S1 & S2 heard, ireegular, tachycardic, No JVD, murmurs, rubs, gallops or clicks. No pedal edema. Gastrointestinal system: Abdomen is nondistended, soft and nontender. No organomegaly or masses felt. Normal bowel sounds heard. Central nervous system: Alert and oriented. No focal neurological deficits. Extremities: Symmetric 5 x 5 power. Skin: No rashes, lesions or ulcers Psychiatry: Judgement and insight appear normal. Mood & affect appropriate.     Data Reviewed: I have personally reviewed following labs and imaging studies  CBC:  Recent Labs Lab 04/20/16 1514 04/21/16 0153  WBC 12.7* 10.0  NEUTROABS 7.8*  --   HGB 12.4 11.8*  HCT 37.5 34.5*  MCV 90.1 89.8  PLT 358 283   Basic Metabolic Panel:  Recent Labs Lab 04/20/16 1514 04/21/16 0153  NA 136 140  K 3.4* 3.8  CL 101 108  CO2 23 22  GLUCOSE 106* 105*  BUN 22* 19  CREATININE 1.07* 0.91  CALCIUM 9.6 9.2   GFR: Estimated Creatinine Clearance: 59.7 mL/min (by C-G  formula based on SCr of 0.91 mg/dL). Liver Function Tests:  Recent Labs Lab 04/20/16 1514 04/21/16 0153  AST 58* 45*  ALT 37 32  ALKPHOS 147* 128*  BILITOT 0.8 0.6  PROT 7.8 6.8  ALBUMIN 3.8 3.2*   No results for input(s): LIPASE, AMYLASE in the last 168 hours. No results for input(s): AMMONIA in the last 168 hours. Coagulation Profile: No results for input(s): INR, PROTIME in the last 168 hours. Cardiac Enzymes:  Recent Labs Lab 04/20/16 1514 04/20/16 2005 04/21/16 0153 04/21/16 0805  TROPONINI <0.03 <0.03 <0.03 <0.03    BNP (last 3 results) No results for input(s): PROBNP in the last 8760 hours. HbA1C: No results for input(s): HGBA1C in the last 72 hours. CBG: No results for input(s): GLUCAP in the last 168 hours. Lipid Profile: No results for input(s): CHOL, HDL, LDLCALC, TRIG, CHOLHDL, LDLDIRECT in the last 72 hours. Thyroid Function Tests:  Recent Labs  04/20/16 1515  TSH 3.592   Anemia Panel: No results for input(s): VITAMINB12, FOLATE, FERRITIN, TIBC, IRON, RETICCTPCT in the last 72 hours. Sepsis Labs: No results for input(s): PROCALCITON, LATICACIDVEN in the last 168 hours.  No results found for this or any previous visit (from the past 240 hour(s)).       Radiology Studies: Dg Chest 2 View  Result Date: 04/20/2016 CLINICAL DATA:  76 year old female with cough for 4 days. Fever chest and back pain. Initial encounter. EXAM: CHEST  2 VIEW COMPARISON:  817 and earlier. FINDINGS: Upright AP and lateral views of the chest. Stable lung volumes. Stable mild cardiomegaly. Other mediastinal contours are within normal limits. Small calcified hilar lymph nodes on the left again noted. Small left upper lobe calcified granuloma associated. No pneumothorax, pulmonary edema, pleural effusion or other confluent pulmonary opacity. No acute osseous abnormality identified. Stable cholecystectomy clips. IMPRESSION: No acute cardiopulmonary abnormality. Mild cardiomegaly. Chronic left lung granulomatous changes. Electronically Signed   By: Odessa Fleming M.D.   On: 04/20/2016 17:12        Scheduled Meds: . aspirin  81 mg Oral q morning - 10a  . azithromycin  500 mg Oral Daily  . enoxaparin (LOVENOX) injection  40 mg Subcutaneous Q24H  . feeding supplement (ENSURE ENLIVE)  237 mL Oral BID BM  . gabapentin  100 mg Oral QHS  . isosorbide mononitrate  30 mg Oral QHS  . levalbuterol  0.63 mg Nebulization Q8H  . lisinopril  20 mg Oral BID  . multivitamin with minerals  1 tablet Oral Daily  . pantoprazole  40 mg  Oral Daily  . pravastatin  40 mg Oral QHS  . predniSONE  40 mg Oral QAC breakfast  . tamsulosin  0.4 mg Oral Daily   Continuous Infusions: . diltiazem (CARDIZEM) infusion Stopped (04/21/16 1429)     LOS: 0 days    Time spent: 30 minutes.     Kathlen Mody, MD [facility] Pager [phone] If 7PM-7AM, please contact night-coverage [email or web address] Password [email or web address] 04/21/2016, 7:06 PM

## 2016-04-21 NOTE — ED Notes (Signed)
Dr Blake Divine paged due to BP 84/46. Awaiting return call

## 2016-04-21 NOTE — ED Notes (Signed)
Erie Noe, RT at bedside.

## 2016-04-21 NOTE — ED Notes (Addendum)
EKG given to DR. Kohut due to monitor stating "VTACH". Dr. Juleen China confirmed pt is not in vtach.

## 2016-04-21 NOTE — ED Notes (Signed)
Dr Blake Divine at bedside. Verbal order to discontinue cardizem drip and fluid bolus

## 2016-04-21 NOTE — Progress Notes (Signed)
*  PRELIMINARY RESULTS* Echocardiogram 2D Echocardiogram has been performed.  Jeryl Columbia 04/21/2016, 10:01 AM

## 2016-04-21 NOTE — ED Notes (Signed)
Dr Blake Divine updated on HR 97-120. States no cardizem drip at this time

## 2016-04-21 NOTE — ED Notes (Signed)
Patient HR increased to 130-150s sustained. Appeared to be Afib/A Flutter on bedside monitor. Cardizem which was held earlier in the shift due to VS at that time, given at this time. 12 lead EKG captured, given to Dr. Nedra Hai

## 2016-04-22 DIAGNOSIS — J069 Acute upper respiratory infection, unspecified: Secondary | ICD-10-CM

## 2016-04-22 DIAGNOSIS — R079 Chest pain, unspecified: Secondary | ICD-10-CM | POA: Diagnosis not present

## 2016-04-22 DIAGNOSIS — B9789 Other viral agents as the cause of diseases classified elsewhere: Secondary | ICD-10-CM | POA: Diagnosis not present

## 2016-04-22 LAB — BASIC METABOLIC PANEL
ANION GAP: 6 (ref 5–15)
BUN: 19 mg/dL (ref 6–20)
CO2: 23 mmol/L (ref 22–32)
Calcium: 9.1 mg/dL (ref 8.9–10.3)
Chloride: 110 mmol/L (ref 101–111)
Creatinine, Ser: 0.84 mg/dL (ref 0.44–1.00)
GLUCOSE: 117 mg/dL — AB (ref 65–99)
POTASSIUM: 3.9 mmol/L (ref 3.5–5.1)
Sodium: 139 mmol/L (ref 135–145)

## 2016-04-22 LAB — CBC
HCT: 32.4 % — ABNORMAL LOW (ref 36.0–46.0)
HEMOGLOBIN: 10.4 g/dL — AB (ref 12.0–15.0)
MCH: 29.3 pg (ref 26.0–34.0)
MCHC: 32.1 g/dL (ref 30.0–36.0)
MCV: 91.3 fL (ref 78.0–100.0)
PLATELETS: 339 10*3/uL (ref 150–400)
RBC: 3.55 MIL/uL — AB (ref 3.87–5.11)
RDW: 12.4 % (ref 11.5–15.5)
WBC: 15.2 10*3/uL — AB (ref 4.0–10.5)

## 2016-04-22 LAB — MAGNESIUM: Magnesium: 1.7 mg/dL (ref 1.7–2.4)

## 2016-04-22 MED ORDER — BOOST / RESOURCE BREEZE PO LIQD
1.0000 | Freq: Two times a day (BID) | ORAL | Status: DC
  Administered 2016-04-22: 1 via ORAL

## 2016-04-22 MED ORDER — LEVALBUTEROL HCL 0.63 MG/3ML IN NEBU: 0.6300 mg | INHALATION_SOLUTION | Freq: Three times a day (TID) | RESPIRATORY_TRACT | Status: DC | PRN

## 2016-04-22 MED ORDER — APIXABAN 5 MG PO TABS
5.0000 mg | ORAL_TABLET | Freq: Two times a day (BID) | ORAL | Status: DC
  Administered 2016-04-22 – 2016-04-23 (×2): 5 mg via ORAL
  Filled 2016-04-22 (×2): qty 1

## 2016-04-22 NOTE — Progress Notes (Signed)
PROGRESS NOTE    [name]  MCE:[id number] DOB: 07/10/1940 DOA: 04/20/2016 PCP: [name], MD    Brief Narrative: [name] is an 76 y.o. female with hx of CMP, HTN, CAD, hypohyroidism, HLD, GERD, LBBB, presented to the PCP office for routine follow up , found to be having tachycardia at 140 and sent into the ER for further evaluation.   Assessment & Plan:   Active Problems:   Hypothyroidism   Hyperlipidemia   Coronary atherosclerosis of native coronary artery   PSVT (paroxysmal supraventricular tachycardia) (HCC)   URI (upper respiratory infection)   A Flutter: -Discussed EKG results with Dr. Purvis Sheffield. He has reviewed and confirms a flutter on one EKG and a flutter with variable conduction on a subsequent EKG. -She is currently rate controlled and in fact has been bradycardic into the 30s-50s. No rate-controlling med for now. -Has a CHADSVASC score of at least 3; will start Eliquis for anticoagulation purposes. -Will need OP follow up with cardiology once discharged.   Chest pain:  -resolved. Negative troponins.  -Secondary to above most likely. - Echo shows  LV Systolic function was   difficult to assess due to tachycardia, but appeared grossly   normal. The estimated ejection fraction was in the range of 50%   to 55%. Features are consistent with a pseudonormal left   ventricular filling pattern, with concomitant abnormal relaxation   and increased filling pressure (grade 2 diastolic dysfunction).   Doppler parameters are consistent with high ventricular filling   pressure. Mild to moderate concentric LVH.   Hypokalemia:  -repleted.    Hypothyroidism: - tsh within normal limits. -Resume synthroid.   Bronchitis:  On z pack and prednisone taper.      DVT prophylaxis: (Lovenox/) Code Status: (Full Family Communication: none at bedside.  Disposition Plan: pending further eval after PT.   Consultants:  Cardiology.   Procedures:  Echocardiogram.    Antimicrobials: (none.    Subjective: Denies any chest pain.   Objective: Vitals:   04/21/16 2201 04/22/16 0137 04/22/16 0527 04/22/16 0639  BP: (!) 104/47 118/67 120/64   Pulse: 84 (!) 125 (!) 110   Resp: 20 16 18    Temp: 98.3 F (36.8 C)  98.1 F (36.7 C)   TempSrc: Oral  Oral   SpO2: 95% 98% 96% 96%  Weight:      Height:        Intake/Output Summary (Last 24 hours) at 04/22/16 1531 Last data filed at 04/22/16 0527  Gross per 24 hour  Intake           688.75 ml  Output              400 ml  Net           288.75 ml   Filed Weights   04/20/16 1457 04/21/16 1628  Weight: 93 kg (205 lb) 93.2 kg (205 lb 6.4 oz)    Examination:  General exam: Appears calm and comfortable  Respiratory system: Clear to auscultation. Respiratory effort normal. Cardiovascular system: S1 & S2 heard, ireegular, tachycardic, No JVD, murmurs, rubs, gallops or clicks. No pedal edema. Gastrointestinal system: Abdomen is nondistended, soft and nontender. No organomegaly or masses felt. Normal bowel sounds heard. Central nervous system: Alert and oriented. No focal neurological deficits. Extremities: Symmetric 5 x 5 power. Skin: No rashes, lesions or ulcers Psychiatry: Judgement and insight appear normal. Mood & affect appropriate.     Data Reviewed: I have personally reviewed following  labs and imaging studies  CBC:  Recent Labs Lab 04/20/16 1514 04/21/16 0153 04/22/16 0540  WBC 12.7* 10.0 15.2*  NEUTROABS 7.8*  --   --   HGB 12.4 11.8* 10.4*  HCT 37.5 34.5* 32.4*  MCV 90.1 89.8 91.3  PLT 358 283 339   Basic Metabolic Panel:  Recent Labs Lab 04/20/16 1514 04/21/16 0153 04/22/16 0540  NA 136 140 139  K 3.4* 3.8 3.9  CL 101 108 110  CO2 23 22 23   GLUCOSE 106* 105* 117*  BUN 22* 19 19  CREATININE 1.07* 0.91 0.84  CALCIUM 9.6 9.2 9.1  MG  --   --  1.7   GFR: Estimated Creatinine Clearance: 64.7 mL/min (by C-G formula based on SCr of 0.84 mg/dL). Liver  Function Tests:  Recent Labs Lab 04/20/16 1514 04/21/16 0153  AST 58* 45*  ALT 37 32  ALKPHOS 147* 128*  BILITOT 0.8 0.6  PROT 7.8 6.8  ALBUMIN 3.8 3.2*   No results for input(s): LIPASE, AMYLASE in the last 168 hours. No results for input(s): AMMONIA in the last 168 hours. Coagulation Profile: No results for input(s): INR, PROTIME in the last 168 hours. Cardiac Enzymes:  Recent Labs Lab 04/20/16 1514 04/20/16 2005 04/21/16 0153 04/21/16 0805  TROPONINI <0.03 <0.03 <0.03 <0.03   BNP (last 3 results) No results for input(s): PROBNP in the last 8760 hours. HbA1C: No results for input(s): HGBA1C in the last 72 hours. CBG: No results for input(s): GLUCAP in the last 168 hours. Lipid Profile: No results for input(s): CHOL, HDL, LDLCALC, TRIG, CHOLHDL, LDLDIRECT in the last 72 hours. Thyroid Function Tests:  Recent Labs  04/20/16 1515  TSH 3.592   Anemia Panel: No results for input(s): VITAMINB12, FOLATE, FERRITIN, TIBC, IRON, RETICCTPCT in the last 72 hours. Sepsis Labs: No results for input(s): PROCALCITON, LATICACIDVEN in the last 168 hours.  No results found for this or any previous visit (from the past 240 hour(s)).       Radiology Studies: Dg Chest 2 View  Result Date: 04/20/2016 CLINICAL DATA:  76 year old female with cough for 4 days. Fever chest and back pain. Initial encounter. EXAM: CHEST  2 VIEW COMPARISON:  817 and earlier. FINDINGS: Upright AP and lateral views of the chest. Stable lung volumes. Stable mild cardiomegaly. Other mediastinal contours are within normal limits. Small calcified hilar lymph nodes on the left again noted. Small left upper lobe calcified granuloma associated. No pneumothorax, pulmonary edema, pleural effusion or other confluent pulmonary opacity. No acute osseous abnormality identified. Stable cholecystectomy clips. IMPRESSION: No acute cardiopulmonary abnormality. Mild cardiomegaly. Chronic left lung granulomatous changes.  Electronically Signed   By: Odessa Fleming M.D.   On: 04/20/2016 17:12        Scheduled Meds: . apixaban  5 mg Oral BID  . aspirin  81 mg Oral q morning - 10a  . azithromycin  500 mg Oral Daily  . feeding supplement  1 Container Oral BID BM  . gabapentin  100 mg Oral QHS  . isosorbide mononitrate  30 mg Oral QHS  . lisinopril  20 mg Oral BID  . multivitamin with minerals  1 tablet Oral Daily  . pantoprazole  40 mg Oral Daily  . pravastatin  40 mg Oral QHS  . predniSONE  40 mg Oral QAC breakfast  . tamsulosin  0.4 mg Oral Daily   Continuous Infusions: . sodium chloride 75 mL/hr at 04/22/16 1116  . diltiazem (CARDIZEM) infusion Stopped (04/21/16 1429)  LOS: 0 days    Time spent: 30 minutes.     Chaya Jan, MD [facility] Pager [phone] If 7PM-7AM, please contact night-coverage [email or web address] Password [email or web address] 04/22/2016, 3:31 PM

## 2016-04-22 NOTE — Progress Notes (Signed)
Initial Nutrition Assessment  DOCUMENTATION CODES:  Obesity unspecified  INTERVENTION:  D/C ensure  Boost Breeze po BID, each supplement provides 250 kcal and 9 grams of protein  Food preferences  NUTRITION DIAGNOSIS:  Inadequate oral intake related to poor appetite as evidenced by Pt's report of consuming over 33% of her normal amount x 2-3 weeks.  GOAL:  Patient will meet greater than or equal to 90% of their needs  MONITOR:  PO intake, Supplement acceptance, Labs, I & O's  REASON FOR ASSESSMENT:  Malnutrition Screening Tool    ASSESSMENT:  76 y/o female PMHx CHF,  HTN, CAD, Hypothyroidism, GERD, LBBB. Presented from PCP office after found to be tachycardic. Pt reports chest pain w/ coughs & wheezing for 1 week. Worked up for PSVT and URI.   Pt reports that she has been eating poorly for the last 2-3 weeks due to poor appetite. She quantifies her intake as 33% as much as her normal. She did not follow any type of diet at home. "I like my salt", though says she really doesn't use much. The only "Vegetables" she eats are "corn, potatoes and carrots". When told corn and potatoes werent vegetables, she quickly said she doesn't consume them often. She took Vit C, fishoil, B12 at home. Did not drink any nutritional supplements  Has had some gagging w/ vomiting and diarrhea w/ episodes of fecal incontinence.   She says her UBW is 210 lbs. She was weighed at her outpatient appointment as 205 lbs.  This loss is not clinically significant.   At this time, she says she does not care for Ensure that much. Given nausea, will try Breeze. Took preferences to promote PO intake. She notes she would like salt, though RD stated it was unlikely her diet would be liberalized given her Afib and pmhx. Would not feel diet liberalization would be in patients best interest.   NFPE: Obese.   Labs: WBC: 15.2  H/H:10.4/32.4, Albumin:3.2 Medications: ABx, ppi, mvi with min, prednisone   Recent  Labs Lab 04/20/16 1514 04/21/16 0153 04/22/16 0540  NA 136 140 139  K 3.4* 3.8 3.9  CL 101 108 110  CO2 23 22 23   BUN 22* 19 19  CREATININE 1.07* 0.91 0.84  CALCIUM 9.6 9.2 9.1  MG  --   --  1.7  GLUCOSE 106* 105* 117*   Diet Order:  Diet Heart Room service appropriate? Yes; Fluid consistency: Thin  Skin:  Reviewed, no issues  Last BM:  1/9  Height:  Ht Readings from Last 1 Encounters:  04/21/16 5\' 3"  (1.6 m)   Weight:  Wt Readings from Last 1 Encounters:  04/21/16 205 lb 6.4 oz (93.2 kg)    Wt Readings from Last 10 Encounters:  04/21/16 205 lb 6.4 oz (93.2 kg)  02/25/16 209 lb (94.8 kg)  02/21/16 209 lb (94.8 kg)  08/19/15 210 lb (95.3 kg)  06/03/15 204 lb (92.5 kg)  04/17/15 207 lb (93.9 kg)  02/28/15 210 lb (95.3 kg)  08/30/14 219 lb (99.3 kg)  12/01/13 208 lb (94.3 kg)  08/30/13 209 lb (94.8 kg)   Ideal Body Weight:  52.27 kg  BMI:  Body mass index is 36.38 kg/m.  Estimated Nutritional Needs:  Kcal:  1400-1600 (15-17 kcal/kg bw) Protein:  63-74 g Pro (1.2-1.4 g/kg bw) Fluid:  1.4-1.6 L (1 ml/kcal)  EDUCATION NEEDS:  Education needs no appropriate at this time  Christophe Louis RD, LDN, CNSC Clinical Nutrition Pager: [phone] 04/22/2016 12:19 PM

## 2016-04-22 NOTE — Progress Notes (Signed)
ANTICOAGULATION CONSULT NOTE - Initial Consult  Pharmacy Consult for Apixaban Indication: atrial fibrillation  Allergies  Allergen Reactions  . Codeine Itching    "feel like going buggy"  Itchy skin  . Tape Itching    Nylon tape makes pt feel itchy    Patient Measurements: Height: 5\' 3"  (160 cm) Weight: 205 lb 6.4 oz (93.2 kg) IBW/kg (Calculated) : 52.4  Vital Signs: Temp: 98.1 F (36.7 C) (01/10 0527) Temp Source: Oral (01/10 0527) BP: 120/64 (01/10 0527) Pulse Rate: 110 (01/10 0527)  Labs:  Recent Labs  04/20/16 1514 04/20/16 2005 04/21/16 0153 04/21/16 0805 04/22/16 0540  HGB 12.4  --  11.8*  --  10.4*  HCT 37.5  --  34.5*  --  32.4*  PLT 358  --  283  --  339  CREATININE 1.07*  --  0.91  --  0.84  TROPONINI <0.03 <0.03 <0.03 <0.03  --     Estimated Creatinine Clearance: 64.7 mL/min (by C-G formula based on SCr of 0.84 mg/dL).   Medical History: Past Medical History:  Diagnosis Date  . Anemia   . Cardiomyopathy (HCC)    LVEF 40-45% 2011  . CHF (congestive heart failure) (HCC)   . Chronic insomnia   . Coronary atherosclerosis of native coronary artery    60% LAD in 2002  . DJD (degenerative joint disease)    Left THA  . Essential hypertension, benign   . GERD (gastroesophageal reflux disease)   . Hyperlipidemia   . Hypothyroidism   . Left bundle branch block   . Nephrolithiasis   . Obesity    Medications:  Prescriptions Prior to Admission  Medication Sig Dispense Refill Last Dose  . amLODipine (NORVASC) 10 MG tablet Take 10 mg by mouth every morning.    04/20/2016 at Unknown time  . aspirin 81 MG tablet Take 81 mg by mouth every morning.    04/20/2016 at Unknown time  . Calcium Carbonate-Vitamin D (CALCIUM 600 + D PO) Take 1 tablet by mouth 2 (two) times daily.   04/20/2016 at Unknown time  . furosemide (LASIX) 40 MG tablet Take 20 mg by mouth daily as needed for fluid or edema.    unknown  . gabapentin (NEURONTIN) 100 MG capsule Take 100 mg by mouth  at bedtime.   04/19/2016 at Unknown time  . isosorbide mononitrate (IMDUR) 30 MG 24 hr tablet Take 1 tablet (30 mg total) by mouth daily. (Patient taking differently: Take 30 mg by mouth at bedtime. ) 30 tablet 11 04/19/2016 at Unknown time  . lisinopril (PRINIVIL,ZESTRIL) 20 MG tablet Take 20 mg by mouth 2 (two) times daily.    04/20/2016 at Unknown time  . Multiple Vitamin (MULTIVITAMIN WITH MINERALS) TABS Take 1 tablet by mouth daily.   04/20/2016 at Unknown time  . nitroGLYCERIN (NITROSTAT) 0.4 MG SL tablet Place 0.4 mg under the tongue every 5 (five) minutes as needed. For chest pain    unknown  . omeprazole (PRILOSEC) 20 MG capsule Take 40 mg by mouth every morning.    04/20/2016 at Unknown time  . pravastatin (PRAVACHOL) 40 MG tablet Take 40 mg by mouth at bedtime.    04/19/2016 at Unknown time  . ondansetron (ZOFRAN) 4 MG tablet Take 1 tablet (4 mg total) by mouth every 8 (eight) hours as needed for nausea or vomiting. (Patient not taking: Reported on 04/20/2016) 20 tablet 0 Not Taking at Unknown time  . oxyCODONE-acetaminophen (ROXICET) 5-325 MG tablet Take 1 tablet by  mouth every 6 (six) hours as needed for severe pain. (Patient not taking: Reported on 04/20/2016) 30 tablet 0 Not Taking at Unknown time  . tamsulosin (FLOMAX) 0.4 MG CAPS capsule Take 1 capsule (0.4 mg total) by mouth daily. (Patient not taking: Reported on 04/20/2016) 30 capsule 0 Not Taking at Unknown time   Assessment: 76yo female with hx of CMP, HTN, CAD, hypohyroidism, HLD, GERD, LBBB, presented to the PCP office for routine follow up , found to be having tachycardia at 140 and sent into the ER for further evaluation.  Asked to initiate Apixaban for afib.  Goal of Therapy:  Stroke prevention Monitor platelets by anticoagulation protocol: Yes   Plan:  Apixaban 5mg  po bid F/u education Monitor for s/sx bleeding complications  Valrie Hart A 04/22/2016,2:51 PM

## 2016-04-23 DIAGNOSIS — B9789 Other viral agents as the cause of diseases classified elsewhere: Secondary | ICD-10-CM

## 2016-04-23 DIAGNOSIS — J069 Acute upper respiratory infection, unspecified: Secondary | ICD-10-CM | POA: Diagnosis not present

## 2016-04-23 DIAGNOSIS — R079 Chest pain, unspecified: Secondary | ICD-10-CM | POA: Diagnosis not present

## 2016-04-23 LAB — CBC
HEMATOCRIT: 33.4 % — AB (ref 36.0–46.0)
HEMOGLOBIN: 11.2 g/dL — AB (ref 12.0–15.0)
MCH: 30.5 pg (ref 26.0–34.0)
MCHC: 33.5 g/dL (ref 30.0–36.0)
MCV: 91 fL (ref 78.0–100.0)
Platelets: 361 10*3/uL (ref 150–400)
RBC: 3.67 MIL/uL — ABNORMAL LOW (ref 3.87–5.11)
RDW: 12.9 % (ref 11.5–15.5)
WBC: 14.2 10*3/uL — ABNORMAL HIGH (ref 4.0–10.5)

## 2016-04-23 LAB — BASIC METABOLIC PANEL
ANION GAP: 3 — AB (ref 5–15)
BUN: 18 mg/dL (ref 6–20)
CALCIUM: 9.3 mg/dL (ref 8.9–10.3)
CO2: 25 mmol/L (ref 22–32)
Chloride: 111 mmol/L (ref 101–111)
Creatinine, Ser: 0.88 mg/dL (ref 0.44–1.00)
GFR calc Af Amer: >60 mL/min (ref >60)
GFR calc non Af Amer: >60 mL/min (ref >60)
GLUCOSE: 104 mg/dL — AB (ref 65–99)
POTASSIUM: 3.9 mmol/L (ref 3.5–5.1)
Sodium: 139 mmol/L (ref 135–145)

## 2016-04-23 MED ORDER — APIXABAN 5 MG PO TABS: 5.0000 mg | ORAL_TABLET | Freq: Two times a day (BID) | ORAL | 2 refills | Status: DC

## 2016-04-23 NOTE — Evaluation (Signed)
Physical Therapy Evaluation Patient Details Name: WANIYA KWONG MRN: [id number] DOB: 01/16/1941 Today's Date: 04/23/2016   History of Present Illness  76 y.o. female with hx of CMP, HTN, CAD, hypohyroidism, HLD, GERD, LBBB, presented to the PCP office for routine follow up , found to be having tachycardia at 140 and sent into the ER for further evaluation.  Dx: PSVT, and Aflutter with rate controlled.   Clinical Impression  Pt received in bed, and was agreeable to PT evaluation.  Pt expressed that she is independent with ambulation, ADL's, IADL's, she drives, and is an Microbiologist.  She goes dancing several times per week with her sister.  During PT evaluation she ambulated 254ft independently with no overt gait deviations, and HR ranged from 78-102bpm.  She does not demonstrate need for skilled PT at this time.    Additionally, pt expressed that recently, the pt had expressed to her PCP that she could hear her heart beat in her ears when she was walking and it felt like it was beating quickly.  She did not have any of this during ambulation today.  Also, pt states that for the past 2 weeks she has been having some bowel incontinence.  Pt states that it is really soft and states that it is more foul smelling than normal.          Follow Up Recommendations No PT follow up    Equipment Recommendations  None recommended by PT    Recommendations for Other Services       Precautions / Restrictions Precautions Precautions: None Restrictions Weight Bearing Restrictions: No      Mobility  Bed Mobility Overal bed mobility: Independent                Transfers Overall transfer level: Independent                  Ambulation/Gait Ambulation/Gait assistance: Independent Ambulation Distance (Feet): 200 Feet Assistive device: None Gait Pattern/deviations: WFL(Within Functional Limits)     General Gait Details: HR ranged from 78-102bpm during gait.    Stairs            Wheelchair Mobility    Modified Rankin (Stroke Patients Only)       Balance Overall balance assessment: Independent                                           Pertinent Vitals/Pain Pain Assessment: No/denies pain    Home Living   Living Arrangements: Alone Available Help at Discharge: Family (sister comes every other day for social outings. ) Type of Home: House Home Access: Stairs to enter   [employer] of Steps: 2 steps - back Home Layout: One level Home Equipment: Walker - 2 wheels;Cane - single point;Bedside commode;Shower seat;Wheelchair - manual;Hospital bed Additional Comments: Pt states that she does not use any of the DME, it was all her husbands.      Prior Function Level of Independence: Independent   Gait / Transfers Assistance Needed: independent.   ADL's / Homemaking Assistance Needed: independent with ADL's, and IADL's.  Driving, and goes dancing with her sister a few times per week.          Hand Dominance   Dominant Hand: Right    Extremity/Trunk Assessment   Upper Extremity Assessment Upper Extremity Assessment: Overall WFL for tasks assessed  Lower Extremity Assessment Lower Extremity Assessment: Overall WFL for tasks assessed       Communication   Communication: No difficulties  Cognition Arousal/Alertness: Awake/alert Behavior During Therapy: WFL for tasks assessed/performed Overall Cognitive Status: Within Functional Limits for tasks assessed                      General Comments      Exercises     Assessment/Plan    PT Assessment Patent does not need any further PT services  PT Problem List            PT Treatment Interventions      PT Goals (Current goals can be found in the Care Plan section)  Acute Rehab PT Goals PT Goal Formulation: All assessment and education complete, DC therapy    Frequency     Barriers to discharge        Co-evaluation                End of Session Equipment Utilized During Treatment: Gait belt Activity Tolerance: Patient tolerated treatment well Patient left: with call bell/phone within reach (Sitting on the EOB) Nurse Communication: Mobility status Artist, RN notified of pt's mobility status, mobility sheet left hanging in the room. )    Functional Assessment Tool Used: [employer] AM-PAC "6-clicks"  Functional Limitation: Mobility: Walking and moving around Mobility: Walking and Moving Around Current Status [phone]): 0 percent impaired, limited or restricted Mobility: Walking and Moving Around Goal Status [phone]): 0 percent impaired, limited or restricted Mobility: Walking and Moving Around Discharge Status [phone]): 0 percent impaired, limited or restricted    Time: 1008-1029 PT Time Calculation (min) (ACUTE ONLY): 21 min   Charges:   PT Evaluation $PT Eval Low Complexity: 1 Procedure     PT G Codes:   PT G-Codes **NOT FOR INPATIENT CLASS** Functional Assessment Tool Used: [employer] "6-clicks"  Functional Limitation: Mobility: Walking and moving around Mobility: Walking and Moving Around Current Status [phone]): 0 percent impaired, limited or restricted Mobility: Walking and Moving Around Goal Status [phone]): 0 percent impaired, limited or restricted Mobility: Walking and Moving Around Discharge Status [phone]): 0 percent impaired, limited or restricted    Beth Marland Reine, PT, DPT X: [id number]

## 2016-04-23 NOTE — Care Management Note (Signed)
Case Management Note  Patient Details  Name: [name] MRN: [id number] Date of Birth: Aug 29, 1940     Expected Discharge Date:       04/23/2016           Expected Discharge Plan:  Home/Self Care  In-House Referral:  NA  Discharge planning Services  CM Consult  Post Acute Care Choice:  NA Choice offered to:  NA  DME Arranged:    DME Agency:     HH Arranged:    HH Agency:     Status of Service:  Completed, signed off  If discussed at [employer] of Stay Meetings, dates discussed:    Additional Comments: Patient discharging home today. No CM needs.   [name], [name], RN 04/23/2016, 1:00 PM

## 2016-04-23 NOTE — Progress Notes (Signed)
Discharge instructions read to patient.  Patient verbalized understanding of all instructions. Discharge to home with friend

## 2016-04-23 NOTE — Discharge Summary (Signed)
Physician Discharge Summary  [name] TXL:[id number] DOB: Apr 29, 1940 DOA: 04/20/2016  PCP: [name], MD  Admit date: 04/20/2016 Discharge date: 04/23/2016  Time spent: 45 minutes  Recommendations for Outpatient Follow-up:  -To be discharged home today. -Advised to follow-up with cardiology in 2 weeks.   Discharge Diagnoses:  Active Problems:   Hypothyroidism   Hyperlipidemia   Coronary atherosclerosis of native coronary artery   PSVT (paroxysmal supraventricular tachycardia) (HCC)   URI (upper respiratory infection)   Discharge Condition: Stable and improved  Filed Weights   04/20/16 1457 04/21/16 1628  Weight: 93 kg (205 lb) 93.2 kg (205 lb 6.4 oz)    History of present illness:  As per Dr. Conley Rolls on 1/8: [name] is an 76 y.o. female with hx of CMP, HTN, CAD, hypohyroidism, HLD, GERD, LBBB, presented to the PCP office for routine follow up today, found to be having tachycardia at 140 and sent into the ER.  She did have chest pain with coughs, and having some wheezing.  Her coughs have been for a week now, and she had no fever, chills, or myalgia. Her cough was productive.  She was found to have PSVT, though it was irregular, and was suggestive of afib with RVR.  Close looking at her EKG however, in V1, there are distinct p waves.  Cardiology was consulted, and did not think her episode was afib.  In the ER other work up included a mild leukocytosis with WBC of 12K, normal renal fx test and K of 3.4.  Her CXR did not show any infiltrate or congestion.  Hospitalist was asked to admit her for cardiac r/out and to monitor her rhythm.    Hospital Course:   A Flutter: -Discussed EKG results with Dr. Purvis Sheffield. He has reviewed and confirms a flutter on one EKG and a flutter with variable conduction on a subsequent EKG. -She is currently rate controlled and in fact has been bradycardic into the 30s-50s. No rate-controlling med for now. -Has a CHADSVASC score of at  least 3; has been started on Eliquis for anticoagulation purposes. -Will need OP follow up with cardiology once discharged.   Chest pain:  -resolved. Negative troponins.  -Secondary to above most likely. - Echo shows  LV Systolic function was difficult to assess due to tachycardia, but appeared grossly normal. The estimated ejection fraction was in the range of 50% to 55%. Features are consistent with a pseudonormal left ventricular filling pattern, with concomitant abnormal relaxation and increased filling pressure (grade 2 diastolic dysfunction). Doppler parameters are consistent with high ventricular filling pressure. Mild to moderate concentric LVH.   Hypokalemia:  -repleted.    Hypothyroidism: - tsh within normal limits. -Resume synthroid.   Bronchitis:  Has completed a course of azithromycin and prednisone taper, clinically improved     Procedures:  ECHO as above  Consultations:  Cardiology via phone  Discharge Instructions  Discharge Instructions    Diet - low sodium heart healthy    Complete by:  As directed    Increase activity slowly    Complete by:  As directed      Allergies as of 04/23/2016      Reactions   Codeine Itching   "feel like going buggy"  Itchy skin   Tape Itching   Nylon tape makes pt feel itchy      Medication List    STOP taking these medications   amLODipine 10 MG tablet Commonly known as:  NORVASC  ondansetron 4 MG tablet Commonly known as:  ZOFRAN   oxyCODONE-acetaminophen 5-325 MG tablet Commonly known as:  ROXICET     TAKE these medications   apixaban 5 MG Tabs tablet Commonly known as:  ELIQUIS Take 1 tablet (5 mg total) by mouth 2 (two) times daily.   aspirin 81 MG tablet Take 81 mg by mouth every morning.   CALCIUM 600 + D PO Take 1 tablet by mouth 2 (two) times daily.   furosemide 40 MG tablet Commonly known as:  LASIX Take 20 mg by mouth daily as needed for fluid or edema.     gabapentin 100 MG capsule Commonly known as:  NEURONTIN Take 100 mg by mouth at bedtime.   isosorbide mononitrate 30 MG 24 hr tablet Commonly known as:  IMDUR Take 1 tablet (30 mg total) by mouth daily. What changed:  when to take this   lisinopril 20 MG tablet Commonly known as:  PRINIVIL,ZESTRIL Take 20 mg by mouth 2 (two) times daily.   multivitamin with minerals Tabs tablet Take 1 tablet by mouth daily.   nitroGLYCERIN 0.4 MG SL tablet Commonly known as:  NITROSTAT Place 0.4 mg under the tongue every 5 (five) minutes as needed. For chest pain   omeprazole 20 MG capsule Commonly known as:  PRILOSEC Take 40 mg by mouth every morning.   pravastatin 40 MG tablet Commonly known as:  PRAVACHOL Take 40 mg by mouth at bedtime.   tamsulosin 0.4 MG Caps capsule Commonly known as:  FLOMAX Take 1 capsule (0.4 mg total) by mouth daily.      Allergies  Allergen Reactions  . Codeine Itching    "feel like going buggy"  Itchy skin  . Tape Itching    Nylon tape makes pt feel itchy   Follow-up Information    Prentice Docker, MD. Schedule an appointment as soon as possible for a visit in 2 week(s).   Specialty:  Cardiology Contact information: [address] [place] [address] [phone]            The results of significant diagnostics from this hospitalization (including imaging, microbiology, ancillary and laboratory) are listed below for reference.    Significant Diagnostic Studies: Dg Chest 2 View  Result Date: 04/20/2016 CLINICAL DATA:  76 year old female with cough for 4 days. Fever chest and back pain. Initial encounter. EXAM: CHEST  2 VIEW COMPARISON:  817 and earlier. FINDINGS: Upright AP and lateral views of the chest. Stable lung volumes. Stable mild cardiomegaly. Other mediastinal contours are within normal limits. Small calcified hilar lymph nodes on the left again noted. Small left upper lobe calcified granuloma associated. No pneumothorax, pulmonary  edema, pleural effusion or other confluent pulmonary opacity. No acute osseous abnormality identified. Stable cholecystectomy clips. IMPRESSION: No acute cardiopulmonary abnormality. Mild cardiomegaly. Chronic left lung granulomatous changes. Electronically Signed   By: Odessa Fleming M.D.   On: 04/20/2016 17:12    Microbiology: No results found for this or any previous visit (from the past 240 hour(s)).   Labs: Basic Metabolic Panel:  Recent Labs Lab 04/20/16 1514 04/21/16 0153 04/22/16 0540 04/23/16 0618  NA 136 140 139 139  K 3.4* 3.8 3.9 3.9  CL 101 108 110 111  CO2 23 22 23 25   GLUCOSE 106* 105* 117* 104*  BUN 22* 19 19 18   CREATININE 1.07* 0.91 0.84 0.88  CALCIUM 9.6 9.2 9.1 9.3  MG  --   --  1.7  --    Liver Function  Tests:  Recent Labs Lab 04/20/16 1514 04/21/16 0153  AST 58* 45*  ALT 37 32  ALKPHOS 147* 128*  BILITOT 0.8 0.6  PROT 7.8 6.8  ALBUMIN 3.8 3.2*   No results for input(s): LIPASE, AMYLASE in the last 168 hours. No results for input(s): AMMONIA in the last 168 hours. CBC:  Recent Labs Lab 04/20/16 1514 04/21/16 0153 04/22/16 0540 04/23/16 0618  WBC 12.7* 10.0 15.2* 14.2*  NEUTROABS 7.8*  --   --   --   HGB 12.4 11.8* 10.4* 11.2*  HCT 37.5 34.5* 32.4* 33.4*  MCV 90.1 89.8 91.3 91.0  PLT 358 283 339 361   Cardiac Enzymes:  Recent Labs Lab 04/20/16 1514 04/20/16 2005 04/21/16 0153 04/21/16 0805  TROPONINI <0.03 <0.03 <0.03 <0.03   BNP: BNP (last 3 results) No results for input(s): BNP in the last 8760 hours.  ProBNP (last 3 results) No results for input(s): PROBNP in the last 8760 hours.  CBG: No results for input(s): GLUCAP in the last 168 hours.     SignedChaya Jan  [facility] Pager: [phone] 04/23/2016, 4:26 PM

## 2016-05-01 ENCOUNTER — Ambulatory Visit (INDEPENDENT_AMBULATORY_CARE_PROVIDER_SITE_OTHER): Payer: Medicare Other | Admitting: Cardiology

## 2016-05-01 ENCOUNTER — Encounter: Payer: Self-pay | Admitting: Cardiology

## 2016-05-01 VITALS — BP 107/69 | HR 79 | Ht 63.0 in | Wt 201.0 lb

## 2016-05-01 DIAGNOSIS — I10 Essential (primary) hypertension: Secondary | ICD-10-CM

## 2016-05-01 DIAGNOSIS — I2510 Atherosclerotic heart disease of native coronary artery without angina pectoris: Secondary | ICD-10-CM

## 2016-05-01 DIAGNOSIS — I484 Atypical atrial flutter: Secondary | ICD-10-CM

## 2016-05-01 DIAGNOSIS — Z7901 Long term (current) use of anticoagulants: Secondary | ICD-10-CM | POA: Diagnosis not present

## 2016-05-01 DIAGNOSIS — Z8679 Personal history of other diseases of the circulatory system: Secondary | ICD-10-CM | POA: Diagnosis not present

## 2016-05-01 MED ORDER — METOPROLOL SUCCINATE ER 25 MG PO TB24: 12.5000 mg | ORAL_TABLET | Freq: Every evening | ORAL | 6 refills | Status: DC

## 2016-05-01 NOTE — Patient Instructions (Signed)
Medication Instructions:   Stop Aspirin.  Begin Toprol XL 12.5mg  every evening.  Continue all other medications.    Labwork:  CBC, BMET - orders given today.  Due just prior to next office visit.    Testing/Procedures: none  Follow-Up: Your physician wants you to follow up in:  3 months.  You will receive a reminder letter in the mail one-two months in advance.  If you don't receive a letter, please call our office to schedule the follow up appointment   Any Other Special Instructions Will Be Listed Below (If Applicable).  If you need a refill on your cardiac medications before your next appointment, please call your pharmacy.

## 2016-05-01 NOTE — Progress Notes (Signed)
Cardiology Office Note  Date: 05/01/2016   ID: [name], DOB Aug 27, 1940, MRN [id number]  PCP: Jillian Courier, MD  Primary Cardiologist: Jillian Dell, MD   Chief Complaint  Patient presents with  . Hospitalization Follow-up    History of Present Illness: [name] is a 76 y.o. female last seen in November 2017. I reviewed interval records. She was recently hospitalized at [facility] with newly documented atrial flutter, ultimately anticoagulated with Eliquis. Case discussed with Dr. Purvis Sheffield based on discharge summary. She was not placed on a heart rate control agent. Follow-up echocardiogram is outlined below. Troponin I levels were negative arguing against ACS.  She tells me today that she did not experience any chest pain at that time, just aware of a very vague sense of palpitations. She has had no dizziness or syncope.  CHADSVASC score is 4. We discussed rationale for continuing Eliquis, although would stop aspirin. I reviewed her follow-up ECG today which shows normal sinus rhythm with left bundle branch block.   Past Medical History:  Diagnosis Date  . Anemia   . Cardiomyopathy (HCC)    LVEF 40-45% 2011  . CHF (congestive heart failure) (HCC)   . Chronic insomnia   . Coronary atherosclerosis of native coronary artery    60% LAD in 2002  . DJD (degenerative joint disease)    Left THA  . Essential hypertension, benign   . GERD (gastroesophageal reflux disease)   . Hyperlipidemia   . Hypothyroidism   . Left bundle branch block   . Nephrolithiasis   . Obesity   . Paroxysmal atrial flutter [facility])    Documented January 2018    Past Surgical History:  Procedure Laterality Date  . ABDOMINAL HYSTERECTOMY     (partial)  . APPENDECTOMY    . CHOLECYSTECTOMY    . COLONOSCOPY  Aug 2013   Dr. Loreta Evans, [facility]: small internal hemorrhoids and few scattered sigmoid diverticula, otherwise normal up to TI, TI appeared normal  .  ESOPHAGOGASTRODUODENOSCOPY N/A 06/01/2012   RMR:5 cm hiatal hernia. Essentially normal esophagus  . HEMORROIDECTOMY    . JOINT REPLACEMENT    . NOSE SURGERY    . REVISION TOTAL HIP ARTHROPLASTY  2009   Left THA 2009    Current Outpatient Prescriptions  Medication Sig Dispense Refill  . apixaban (ELIQUIS) 5 MG TABS tablet Take 1 tablet (5 mg total) by mouth 2 (two) times daily. 60 tablet 2  . Calcium Carbonate-Vitamin D (CALCIUM 600 + D PO) Take 1 tablet by mouth 2 (two) times daily.    . furosemide (LASIX) 40 MG tablet Take 20 mg by mouth daily as needed for fluid or edema.     . gabapentin (NEURONTIN) 100 MG capsule Take 100 mg by mouth at bedtime.    . isosorbide mononitrate (IMDUR) 30 MG 24 hr tablet Take 1 tablet (30 mg total) by mouth daily. (Patient taking differently: Take 30 mg by mouth at bedtime. ) 30 tablet 11  . lisinopril (PRINIVIL,ZESTRIL) 20 MG tablet Take 20 mg by mouth 2 (two) times daily.     . Multiple Vitamin (MULTIVITAMIN WITH MINERALS) TABS Take 1 tablet by mouth daily.    . nitroGLYCERIN (NITROSTAT) 0.4 MG SL tablet Place 0.4 mg under the tongue every 5 (five) minutes as needed. For chest pain     . omeprazole (PRILOSEC) 20 MG capsule Take 40 mg by mouth every morning.     . pravastatin (PRAVACHOL) 40 MG tablet  Take 40 mg by mouth at bedtime.     . tamsulosin (FLOMAX) 0.4 MG CAPS capsule Take 1 capsule (0.4 mg total) by mouth daily. (Patient not taking: Reported on 04/20/2016) 30 capsule 0   No current facility-administered medications for this visit.    Allergies:  Codeine and Tape   Social History: The patient  reports that she has never smoked. She has never used smokeless tobacco. She reports that she does not drink alcohol or use drugs.   ROS:  Please see the history of present illness. Otherwise, complete review of systems is positive for hip pain.  All other systems are reviewed and negative.   Physical Exam: VS:  BP 107/69   Pulse 79   Ht 5\' 3"  (1.6 m)    Wt 201 lb (91.2 kg)   BMI 35.61 kg/m , BMI Body mass index is 35.61 kg/m.  Wt Readings from Last 3 Encounters:  05/01/16 201 lb (91.2 kg)  04/21/16 205 lb 6.4 oz (93.2 kg)  02/25/16 209 lb (94.8 kg)    Overweight woman, appears comfortable at rest. HEENT: Conjunctiva and lids normal, oropharynx clear. Neck: Supple, no elevated JVP or carotid bruits, no thyromegaly. Lungs: Clear to auscultation, nonlabored breathing at rest. Cardiac: Regular rate and rhythm, paradoxically split S2, no S3 or significant systolic murmur, no pericardial rub. Abdomen: Soft, nontender, bowel sounds present. Extremities: Trace edema, distal pulses 2+. Skin: Warm and dry. Musculoskeletal: No kyphosis. Neuropsychiatric: Alert and oriented 3, affect appropriate.  ECG: I personally reviewed the tracing from 04/21/2016 which showed rate-controlled atrial fibrillation with left bundle branch block.  Recent Labwork: 04/20/2016: TSH 3.592 04/21/2016: ALT 32; AST 45 04/22/2016: Magnesium 1.7 04/23/2016: BUN 18; Creatinine, Ser 0.88; Hemoglobin 11.2; Platelets 361; Potassium 3.9; Sodium 139  Other Studies Reviewed Today:  Lexiscan Myoview 08/28/2015:  This is a low risk study.  Nuclear stress EF: 63%.  Defect 1: There is a defect present in the mid anteroseptal, apical anterior and apical septal location. There is a mild degree of reversibility. While LBBB is likely contributing to defect, a small degree of ischemia cannot entirely be excluded.  Echocardiogram 04/21/2016: Study Conclusions  - Left ventricle: The cavity size was normal. Systolic function was   difficult to assess due to tachycardia, but appeared grossly   normal. The estimated ejection fraction was in the range of 50%   to 55%. Features are consistent with a pseudonormal left   ventricular filling pattern, with concomitant abnormal relaxation   and increased filling pressure (grade 2 diastolic dysfunction).   Doppler parameters are consistent  with high ventricular filling   pressure. Mild to moderate concentric LVH. - Ventricular septum: Septal motion showed abnormal function and   dyssynergy. These changes are consistent with intraventricular   conduction delay. - Mitral valve: Moderately calcified annulus. Normal thickness   leaflets . There was moderate regurgitation. - Left atrium: The atrium was moderately to severely dilated. - Right atrium: The atrium was mildly dilated. - Atrial septum: A patent foramen ovale cannot be excluded. - Tricuspid valve: There was mild regurgitation. - Pulmonary arteries: PA peak pressure: 32 mm Hg (S).  Assessment and Plan:  1. Paroxysmal atrial flutter, atypical. She is in sinus rhythm today. CHADSVASC score is 4, would continue Eliquis but stop aspirin. Also plan to place her on Toprol-XL 12.5 mg daily. Follow-up arranged with CBC and BMET.  2. Essential hypertension, blood pressure control is good today.  3. History of cardiomyopathy, recent follow-up echocardiogram shows LVEF  50-55% range.  4. Chronic left bundle branch block. Heart rate in the 70s to 80s. Keep an eye out for symptomatic bradycardia with initiation of beta blocker.  5. CAD, moderate LAD distribution based on previous workup. No active angina with low risk Myoview from last year. No evidence of ACS by troponin I levels during recent hospitalization.  Current medicines were reviewed with the patient today.   Orders Placed This Encounter  Procedures  . EKG 12-Lead    Disposition: Follow-up in 3 months.  Signed, Jonelle Sidle, MD, [facility] 05/01/2016 1:21 PM    [facility] at [facility] [address] [place], [place], [place] [address] Phone: [phone]; Fax: [phone]

## 2016-05-20 DIAGNOSIS — H04123 Dry eye syndrome of bilateral lacrimal glands: Secondary | ICD-10-CM | POA: Diagnosis not present

## 2016-05-20 DIAGNOSIS — H02834 Dermatochalasis of left upper eyelid: Secondary | ICD-10-CM | POA: Diagnosis not present

## 2016-05-20 DIAGNOSIS — Z961 Presence of intraocular lens: Secondary | ICD-10-CM | POA: Diagnosis not present

## 2016-05-20 DIAGNOSIS — H02831 Dermatochalasis of right upper eyelid: Secondary | ICD-10-CM | POA: Diagnosis not present

## 2016-06-29 ENCOUNTER — Other Ambulatory Visit: Payer: Self-pay | Admitting: Cardiology

## 2016-07-24 ENCOUNTER — Other Ambulatory Visit: Payer: Self-pay | Admitting: Cardiology

## 2016-07-24 ENCOUNTER — Telehealth: Payer: Self-pay | Admitting: Cardiology

## 2016-07-24 NOTE — Telephone Encounter (Signed)
°*  STAT* If patient is at the pharmacy, call can be transferred to refill team.   1. Which medications need to be refilled? (please list name of each medication and dose if known)  apixaban (ELIQUIS) 5 MG TABS tablet [[id number]]    2. Which pharmacy/location (including street and city if local pharmacy) is medication to be sent to? [facility] [place]   3. Do they need a 30 day or 90 day supply? 90 day

## 2016-07-27 DIAGNOSIS — E785 Hyperlipidemia, unspecified: Secondary | ICD-10-CM | POA: Diagnosis not present

## 2016-07-27 DIAGNOSIS — I4891 Unspecified atrial fibrillation: Secondary | ICD-10-CM | POA: Diagnosis not present

## 2016-07-27 DIAGNOSIS — I480 Paroxysmal atrial fibrillation: Secondary | ICD-10-CM | POA: Diagnosis not present

## 2016-07-27 DIAGNOSIS — Z0000 Encounter for general adult medical examination without abnormal findings: Secondary | ICD-10-CM | POA: Diagnosis not present

## 2016-07-27 DIAGNOSIS — I10 Essential (primary) hypertension: Secondary | ICD-10-CM | POA: Diagnosis not present

## 2016-08-06 ENCOUNTER — Ambulatory Visit: Payer: Medicare Other | Admitting: Cardiology

## 2016-08-06 ENCOUNTER — Encounter: Payer: Self-pay | Admitting: Cardiology

## 2016-08-06 ENCOUNTER — Ambulatory Visit (INDEPENDENT_AMBULATORY_CARE_PROVIDER_SITE_OTHER): Payer: Medicare Other | Admitting: Cardiology

## 2016-08-06 VITALS — BP 108/78 | HR 89 | Ht 63.0 in | Wt 212.0 lb

## 2016-08-06 DIAGNOSIS — Z8679 Personal history of other diseases of the circulatory system: Secondary | ICD-10-CM

## 2016-08-06 DIAGNOSIS — I484 Atypical atrial flutter: Secondary | ICD-10-CM

## 2016-08-06 DIAGNOSIS — I10 Essential (primary) hypertension: Secondary | ICD-10-CM | POA: Diagnosis not present

## 2016-08-06 DIAGNOSIS — I2510 Atherosclerotic heart disease of native coronary artery without angina pectoris: Secondary | ICD-10-CM

## 2016-08-06 MED ORDER — NITROGLYCERIN 0.4 MG SL SUBL: 0.4000 mg | SUBLINGUAL_TABLET | SUBLINGUAL | 3 refills | Status: AC | PRN

## 2016-08-06 MED ORDER — APIXABAN 5 MG PO TABS: 5.0000 mg | ORAL_TABLET | Freq: Two times a day (BID) | ORAL | 3 refills | Status: DC

## 2016-08-06 MED ORDER — METOPROLOL SUCCINATE ER 25 MG PO TB24: 12.5000 mg | ORAL_TABLET | Freq: Every evening | ORAL | 3 refills | Status: DC

## 2016-08-06 NOTE — Progress Notes (Signed)
Cardiology Office Note  Date: 08/06/2016   ID: [name], [name] 1941-01-03, MRN [id number]  PCP: Jillian Courier, MD  Primary Cardiologist: Jillian Dell, MD   Chief Complaint  Patient presents with  . Atrial Flutter  . History of cardiomyopathy    History of Present Illness: [name] is a 76 y.o. female last seen in January. She presents for a routine follow-up visit. Reports occasional feeling of palpitations but nothing prolonged or progressive. She has had no exertional chest pain. Reports NYHA class 2 dyspnea with most activities.  CHADSVASC score is 4. She continues on Eliquis. We also started Toprol-XL 12.5 mg daily at the last visit. She does not report any bleeding episodes. She states she had lab work obtained earlier this month with Dr. Loleta Evans. We are requesting the results.  Echocardiogram from January revealed LVEF 50-55% range.  Past Medical History:  Diagnosis Date  . Anemia   . Cardiomyopathy (HCC)    LVEF 40-45% 2011  . CHF (congestive heart failure) (HCC)   . Chronic insomnia   . Coronary atherosclerosis of native coronary artery    60% LAD in 2002  . DJD (degenerative joint disease)    Left THA  . Essential hypertension, benign   . GERD (gastroesophageal reflux disease)   . Hyperlipidemia   . Hypothyroidism   . Left bundle branch block   . Nephrolithiasis   . Obesity   . Paroxysmal atrial flutter [facility])    Documented January 2018    Past Surgical History:  Procedure Laterality Date  . ABDOMINAL HYSTERECTOMY     (partial)  . APPENDECTOMY    . CHOLECYSTECTOMY    . COLONOSCOPY  Aug 2013   Dr. Loreta Evans, [facility]: small internal hemorrhoids and few scattered sigmoid diverticula, otherwise normal up to TI, TI appeared normal  . ESOPHAGOGASTRODUODENOSCOPY N/A 06/01/2012   RMR:5 cm hiatal hernia. Essentially normal esophagus  . HEMORROIDECTOMY    . JOINT REPLACEMENT    . NOSE SURGERY    . REVISION TOTAL HIP ARTHROPLASTY   2009   Left THA 2009    Current Outpatient Prescriptions  Medication Sig Dispense Refill  . Calcium Carbonate-Vitamin D (CALCIUM 600 + D PO) Take 1 tablet by mouth 2 (two) times daily.    Marland Kitchen ELIQUIS 5 MG TABS tablet TAKE 1 TABLET BY MOUTH TWICE DAILY 60 tablet 6  . furosemide (LASIX) 40 MG tablet Take 20 mg by mouth daily as needed for fluid or edema.     . gabapentin (NEURONTIN) 100 MG capsule Take 100 mg by mouth at bedtime.    . isosorbide mononitrate (IMDUR) 30 MG 24 hr tablet TAKE ONE TABLET BY MOUTH EVERY DAY 30 tablet 11  . lisinopril (PRINIVIL,ZESTRIL) 20 MG tablet Take 20 mg by mouth 2 (two) times daily.     . metoprolol succinate (TOPROL XL) 25 MG 24 hr tablet Take 0.5 tablets (12.5 mg total) by mouth every evening. 15 tablet 6  . Multiple Vitamin (MULTIVITAMIN WITH MINERALS) TABS Take 1 tablet by mouth daily.    . nitroGLYCERIN (NITROSTAT) 0.4 MG SL tablet Place 1 tablet (0.4 mg total) under the tongue every 5 (five) minutes as needed. For chest pain 25 tablet 3  . omeprazole (PRILOSEC) 20 MG capsule Take 40 mg by mouth every morning.     . pravastatin (PRAVACHOL) 40 MG tablet Take 40 mg by mouth at bedtime.     . tamsulosin (FLOMAX) 0.4 MG CAPS capsule Take  1 capsule (0.4 mg total) by mouth daily. 30 capsule 0   No current facility-administered medications for this visit.    Allergies:  Codeine and Tape   Social History: The patient  reports that she has never smoked. She has never used smokeless tobacco. She reports that she does not drink alcohol or use drugs.   ROS:  Please see the history of present illness. Otherwise, complete review of systems is positive for chronic leg edema.  All other systems are reviewed and negative.   Physical Exam: VS:  BP 108/78   Pulse 89   Ht 5\' 3"  (1.6 m)   Wt 212 lb (96.2 kg)   SpO2 99%   BMI 37.55 kg/m , BMI Body mass index is 37.55 kg/m.  Wt Readings from Last 3 Encounters:  08/06/16 212 lb (96.2 kg)  05/01/16 201 lb (91.2 kg)    04/21/16 205 lb 6.4 oz (93.2 kg)    Overweight woman, appears comfortable at rest. HEENT: Conjunctiva and lids normal, oropharynx clear. Neck: Supple, no elevated JVP or carotid bruits, no thyromegaly. Lungs: Clear to auscultation, nonlabored breathing at rest. Cardiac: Regular rate and rhythm, paradoxically split S2, no S3 or significant systolic murmur, no pericardial rub. Abdomen: Soft, nontender, bowel sounds present. Extremities: Chronic appearing leg edema, distal pulses 2+. Skin: Warm and dry. Musculoskeletal: No kyphosis. Neuropsychiatric: Alert and oriented 3, affect appropriate.  ECG: I personally reviewed the tracing from 05/01/2016 which showed normal sinus rhythm with left bundle-branch block.  Recent Labwork: 04/20/2016: TSH 3.592 04/21/2016: ALT 32; AST 45 04/22/2016: Magnesium 1.7 04/23/2016: BUN 18; Creatinine, Ser 0.88; Hemoglobin 11.2; Platelets 361; Potassium 3.9; Sodium 139     Component Value Date/Time   CHOL 155 06/17/2009 2016   TRIG 97 06/17/2009 2016   HDL 62 06/17/2009 2016   CHOLHDL 2.5 Ratio 06/17/2009 2016   VLDL 19 06/17/2009 2016   LDLCALC 74 06/17/2009 2016    Other Studies Reviewed Today:  Echocardiogram 04/21/2016: Study Conclusions  - Left ventricle: The cavity size was normal. Systolic function was   difficult to assess due to tachycardia, but appeared grossly   normal. The estimated ejection fraction was in the range of 50%   to 55%. Features are consistent with a pseudonormal left   ventricular filling pattern, with concomitant abnormal relaxation   and increased filling pressure (grade 2 diastolic dysfunction).   Doppler parameters are consistent with high ventricular filling   pressure. Mild to moderate concentric LVH. - Ventricular septum: Septal motion showed abnormal function and   dyssynergy. These changes are consistent with intraventricular   conduction delay. - Mitral valve: Moderately calcified annulus. Normal thickness    leaflets . There was moderate regurgitation. - Left atrium: The atrium was moderately to severely dilated. - Right atrium: The atrium was mildly dilated. - Atrial septum: A patent foramen ovale cannot be excluded. - Tricuspid valve: There was mild regurgitation. - Pulmonary arteries: PA peak pressure: 32 mm Hg (S).  Assessment and Plan:  1. Paroxysmal atypical atrial flutter. She reports intermittent palpitations but no prolonged episodes. We will plan to continue with the current regimen including Toprol-XL and Eliquis. Requesting recent lab work from Dr. Loleta Evans. She does not report any bleeding episodes. If rhythm frequency increases, may need to consider antiarrhythmic therapy.  2. History of nonischemic cardiomyopathy, LVEF 50-55% range by most recent echocardiogram in January. She does have chronic leg edema and remains on Lasix. We discussed sodium and fluid restriction guidelines.  3. Essential hypertension,  blood pressure is well controlled today.  4. History of moderate LAD disease, stable without active angina symptoms. She continues on statin therapy.  Current medicines were reviewed with the patient today.  Disposition: Follow-up in 6 months, sooner if needed.  Signed, Jonelle Sidle, MD, [facility] 08/06/2016 1:19 PM    [facility] at [facility] [address] [place], [place] [address] Phone: [phone]; Fax: [phone]

## 2016-08-06 NOTE — Patient Instructions (Signed)
Your physician wants you to follow-up in: 6 months Dr Randa Spike will receive a reminder letter in the mail two months in advance. If you don't receive a letter, please call our office to schedule the follow-up appointment.     Your physician recommends that you continue on your current medications as directed. Please refer to the Current Medication list given to you today.    I refilled your nitroglycerine today at your local pharmacy    If you need a refill on your cardiac medications before your next appointment, please call your pharmacy.      Thank you for choosing [facility] !

## 2016-09-15 DIAGNOSIS — L732 Hidradenitis suppurativa: Secondary | ICD-10-CM | POA: Diagnosis not present

## 2016-09-30 DIAGNOSIS — L732 Hidradenitis suppurativa: Secondary | ICD-10-CM | POA: Diagnosis not present

## 2016-09-30 DIAGNOSIS — N644 Mastodynia: Secondary | ICD-10-CM | POA: Diagnosis not present

## 2016-10-26 ENCOUNTER — Other Ambulatory Visit (HOSPITAL_COMMUNITY): Payer: Self-pay | Admitting: Family Medicine

## 2016-10-26 DIAGNOSIS — R1312 Dysphagia, oropharyngeal phase: Secondary | ICD-10-CM | POA: Diagnosis not present

## 2016-10-26 DIAGNOSIS — L732 Hidradenitis suppurativa: Secondary | ICD-10-CM | POA: Diagnosis not present

## 2016-10-29 ENCOUNTER — Ambulatory Visit (HOSPITAL_COMMUNITY)
Admission: RE | Admit: 2016-10-29 | Discharge: 2016-10-29 | Disposition: A | Discharge status: Home / Self Care | Payer: Medicare Other | Source: Ambulatory Visit | Attending: Family Medicine | Admitting: Family Medicine

## 2016-10-29 DIAGNOSIS — R1312 Dysphagia, oropharyngeal phase: Secondary | ICD-10-CM | POA: Diagnosis not present

## 2016-10-29 DIAGNOSIS — R1310 Dysphagia, unspecified: Secondary | ICD-10-CM | POA: Diagnosis not present

## 2016-10-29 DIAGNOSIS — K449 Diaphragmatic hernia without obstruction or gangrene: Secondary | ICD-10-CM | POA: Insufficient documentation

## 2016-10-29 DIAGNOSIS — K224 Dyskinesia of esophagus: Secondary | ICD-10-CM | POA: Diagnosis not present

## 2017-01-05 ENCOUNTER — Other Ambulatory Visit (HOSPITAL_COMMUNITY): Payer: Self-pay | Admitting: Family Medicine

## 2017-01-05 DIAGNOSIS — Z1231 Encounter for screening mammogram for malignant neoplasm of breast: Secondary | ICD-10-CM

## 2017-01-11 ENCOUNTER — Ambulatory Visit (HOSPITAL_COMMUNITY)
Admission: RE | Admit: 2017-01-11 | Discharge: 2017-01-11 | Disposition: A | Discharge status: Home / Self Care | Payer: Medicare Other | Source: Ambulatory Visit | Attending: Family Medicine | Admitting: Family Medicine

## 2017-01-11 DIAGNOSIS — Z1231 Encounter for screening mammogram for malignant neoplasm of breast: Secondary | ICD-10-CM

## 2017-01-11 DIAGNOSIS — I2510 Atherosclerotic heart disease of native coronary artery without angina pectoris: Secondary | ICD-10-CM | POA: Diagnosis not present

## 2017-01-11 DIAGNOSIS — I484 Atypical atrial flutter: Secondary | ICD-10-CM | POA: Diagnosis not present

## 2017-01-11 DIAGNOSIS — I10 Essential (primary) hypertension: Secondary | ICD-10-CM | POA: Diagnosis not present

## 2017-01-11 DIAGNOSIS — Z8679 Personal history of other diseases of the circulatory system: Secondary | ICD-10-CM | POA: Diagnosis not present

## 2017-01-11 DIAGNOSIS — Z23 Encounter for immunization: Secondary | ICD-10-CM | POA: Diagnosis not present

## 2017-02-02 NOTE — Progress Notes (Signed)
Cardiology Office Note  Date: 02/03/2017   ID: [name], DOB March 16, 1941, MRN [id number]  PCP: Mirna MiresHill, Gerald, MD  Primary Cardiologist: Nona DellSamuel Akon Reinoso, MD   Chief Complaint  Patient presents with  . Atrial Flutter    History of Present Illness: [name] is a 76 y.o. female last seen in April. She presents for a routine follow-up visit. She does not report any chest pain or palpitations. She has been fatigued, taking care of 2 sisters with health problems recently.  I reviewed her medications. Cardiac regimen includes Eliquis, Toprol-XL, Pravachol, Imdur, and lisinopril. She also uses Lasix.  I reviewed her last lab work from Dr. Loleta ChanceHill.  Follow-up echocardiogram in January revealed LVEF 50-55% range.  Past Medical History:  Diagnosis Date  . Anemia   . Cardiomyopathy (HCC)    LVEF 40-45% 2011  . CHF (congestive heart failure) (HCC)   . Chronic insomnia   . Coronary atherosclerosis of native coronary artery    60% LAD in 2002  . DJD (degenerative joint disease)    Left THA  . Essential hypertension, benign   . GERD (gastroesophageal reflux disease)   . Hyperlipidemia   . Hypothyroidism   . Left bundle branch block   . Nephrolithiasis   . Obesity   . Paroxysmal atrial flutter [facility](HCC)    Documented January 2018    Past Surgical History:  Procedure Laterality Date  . ABDOMINAL HYSTERECTOMY     (partial)  . APPENDECTOMY    . CHOLECYSTECTOMY    . COLONOSCOPY  Aug 2013   [facility] Ave[facility]: small internal hemorrhoids and few scattered sigmoid diverticula, otherwise normal up to TI, TI appeared normal  . ESOPHAGOGASTRODUODENOSCOPY N/A 06/01/2012   RMR:5 cm hiatal hernia. Essentially normal esophagus  . HEMORROIDECTOMY    . JOINT REPLACEMENT    . NOSE SURGERY    . REVISION TOTAL HIP ARTHROPLASTY  2009   Left THA 2009    Current Outpatient Prescriptions  Medication Sig Dispense Refill  . apixaban (ELIQUIS) 5 MG TABS tablet Take 1  tablet (5 mg total) by mouth 2 (two) times daily. 180 tablet 3  . Calcium Carbonate-Vitamin D (CALCIUM 600 + D PO) Take 1 tablet by mouth daily.     . furosemide (LASIX) 40 MG tablet Take 20 mg by mouth daily as needed for fluid or edema.     . gabapentin (NEURONTIN) 100 MG capsule Take 100 mg by mouth at bedtime.    . isosorbide mononitrate (IMDUR) 30 MG 24 hr tablet TAKE ONE TABLET BY MOUTH EVERY DAY 30 tablet 11  . lisinopril (PRINIVIL,ZESTRIL) 20 MG tablet Take 20 mg by mouth 2 (two) times daily.     . metoprolol succinate (TOPROL XL) 25 MG 24 hr tablet Take 0.5 tablets (12.5 mg total) by mouth every evening. 45 tablet 3  . Multiple Vitamin (MULTIVITAMIN WITH MINERALS) TABS Take 1 tablet by mouth daily.    . nitroGLYCERIN (NITROSTAT) 0.4 MG SL tablet Place 1 tablet (0.4 mg total) under the tongue every 5 (five) minutes as needed. For chest pain 25 tablet 3  . omeprazole (PRILOSEC) 20 MG capsule Take 20 mg by mouth every morning.     . pravastatin (PRAVACHOL) 40 MG tablet Take 40 mg by mouth at bedtime.     . tamsulosin (FLOMAX) 0.4 MG CAPS capsule Take 1 capsule (0.4 mg total) by mouth daily. 30 capsule 0   No current facility-administered medications for this visit.  Allergies:  Codeine and Tape   Social History: The patient  reports that she has never smoked. She has never used smokeless tobacco. She reports that she does not drink alcohol or use drugs.   ROS:  Please see the history of present illness. Otherwise, complete review of systems is positive for none.  All other systems are reviewed and negative.   Physical Exam: VS:  BP 138/78   Pulse 62   Ht 5\' 3"  (1.6 m)   Wt 217 lb (98.4 kg)   SpO2 99%   BMI 38.44 kg/m , BMI Body mass index is 38.44 kg/m.  Wt Readings from Last 3 Encounters:  02/03/17 217 lb (98.4 kg)  08/06/16 212 lb (96.2 kg)  05/01/16 201 lb (91.2 kg)    General: Obese woman, appears comfortable at rest. HEENT: Conjunctiva and lids normal, oropharynx  clear. Neck: Supple, no elevated JVP or carotid bruits, no thyromegaly. Lungs: Clear to auscultation, nonlabored breathing at rest. Cardiac: Regular rate and rhythm, S4,  2/6 systolic murmur, no pericardial rub. Abdomen: Soft, nontender, bowel sounds present, no guarding or rebound. Extremities: Adipose tissue and mild leg edema, distal pulses 2+. Skin: Warm and dry. Musculoskeletal: No kyphosis. Neuropsychiatric: Alert and oriented x3, affect grossly appropriate.  ECG: I personally reviewed the tracing from 05/01/2016 which shows sinus rhythm with left bundle branch block.  Recent Labwork: 04/20/2016: TSH 3.592 04/21/2016: ALT 32; AST 45 04/22/2016: Magnesium 1.7 04/23/2016: BUN 18; Creatinine, Ser 0.88; Hemoglobin 11.2; Platelets 361; Potassium 3.9; Sodium 139  April 2018: Potassium 4.5, BUN 19, creatinine 0.8, AST 16, ALT 9, hemoglobin 12.7, platelets 247  Other Studies Reviewed Today:  Echocardiogram 04/21/2016: Study Conclusions  - Left ventricle: The cavity size was normal. Systolic function was   difficult to assess due to tachycardia, but appeared grossly   normal. The estimated ejection fraction was in the range of 50%   to 55%. Features are consistent with a pseudonormal left   ventricular filling pattern, with concomitant abnormal relaxation   and increased filling pressure (grade 2 diastolic dysfunction).   Doppler parameters are consistent with high ventricular filling   pressure. Mild to moderate concentric LVH. - Ventricular septum: Septal motion showed abnormal function and   dyssynergy. These changes are consistent with intraventricular   conduction delay. - Mitral valve: Moderately calcified annulus. Normal thickness   leaflets . There was moderate regurgitation. - Left atrium: The atrium was moderately to severely dilated. - Right atrium: The atrium was mildly dilated. - Atrial septum: A patent foramen ovale cannot be excluded. - Tricuspid valve: There was mild  regurgitation. - Pulmonary arteries: PA peak pressure: 32 mm Hg (S).  Assessment and Plan:  1. Paroxysmal atypical atrial flutter. She is doing well without significant palpitations and continues on Toprol-XL and Eliquis.  2. History of nonischemic cardiomyopathy with subsequent improvement in LVEF to the range of 50-55% by most recent echocardiogram. No changes made to present regimen.  3. Essential hypertension, blood pressure control is adequate today.  4. CAD, moderate in LAD distribution without active angina. She remains on statin therapy.  Current medicines were reviewed with the patient today.  Disposition: Follow-up in 6 months.  Signed, Jonelle Sidle, MD, [facility] 02/03/2017 1:11 PM    [facility] at [facility] [address] [place], [place] [address] Phone: [phone]; Fax: [phone]

## 2017-02-03 ENCOUNTER — Encounter: Payer: Self-pay | Admitting: Cardiology

## 2017-02-03 ENCOUNTER — Ambulatory Visit (INDEPENDENT_AMBULATORY_CARE_PROVIDER_SITE_OTHER): Payer: Medicare Other | Admitting: Cardiology

## 2017-02-03 VITALS — BP 138/78 | HR 62 | Ht 63.0 in | Wt 217.0 lb

## 2017-02-03 DIAGNOSIS — Z8679 Personal history of other diseases of the circulatory system: Secondary | ICD-10-CM

## 2017-02-03 DIAGNOSIS — I10 Essential (primary) hypertension: Secondary | ICD-10-CM | POA: Diagnosis not present

## 2017-02-03 DIAGNOSIS — I484 Atypical atrial flutter: Secondary | ICD-10-CM | POA: Diagnosis not present

## 2017-02-03 DIAGNOSIS — I2510 Atherosclerotic heart disease of native coronary artery without angina pectoris: Secondary | ICD-10-CM | POA: Diagnosis not present

## 2017-02-03 NOTE — Patient Instructions (Addendum)

## 2017-04-09 DIAGNOSIS — K580 Irritable bowel syndrome with diarrhea: Secondary | ICD-10-CM | POA: Diagnosis not present

## 2017-04-09 DIAGNOSIS — E039 Hypothyroidism, unspecified: Secondary | ICD-10-CM | POA: Diagnosis not present

## 2017-04-09 DIAGNOSIS — I10 Essential (primary) hypertension: Secondary | ICD-10-CM | POA: Diagnosis not present

## 2017-04-09 DIAGNOSIS — M130 Polyarthritis, unspecified: Secondary | ICD-10-CM | POA: Diagnosis not present

## 2017-05-10 DIAGNOSIS — J219 Acute bronchiolitis, unspecified: Secondary | ICD-10-CM | POA: Diagnosis not present

## 2017-05-10 DIAGNOSIS — J209 Acute bronchitis, unspecified: Secondary | ICD-10-CM | POA: Diagnosis not present

## 2017-05-25 DIAGNOSIS — J219 Acute bronchiolitis, unspecified: Secondary | ICD-10-CM | POA: Diagnosis not present

## 2017-05-31 ENCOUNTER — Other Ambulatory Visit: Payer: Self-pay | Admitting: Cardiology

## 2017-07-07 ENCOUNTER — Other Ambulatory Visit: Payer: Self-pay | Admitting: Cardiology

## 2017-08-03 NOTE — Progress Notes (Signed)
Cardiology Office Note  Date: 08/05/2017   ID: [name], Park Meo Sep 06, 1940, MRN [id number]  PCP: [name], MD  Primary Cardiologist: [name], MD   Chief Complaint  Patient presents with  . Atrial Flutter    History of Present Illness: [name] is a 77 y.o. female last seen in October 2018.  She presents for a follow-up visit.  Reports no chest pain or increasing shortness of breath with activities including dancing.  She does not report any palpitations, dizziness, or syncope.  I personally reviewed her ECG today which shows atrial flutter with left bundle branch block.  We went over her medications.  She remains on Eliquis, denies bleeding problems.  Also Toprol-XL for heart rate control.  She states that she has routine lab work with Dr. [name] her PCP.  Echocardiogram from January 2018 showed LVEF 50-55% range.  She has moderate to severe left atrial enlargement with moderate mitral regurgitation.  Past Medical History:  Diagnosis Date  . Anemia   . Cardiomyopathy (HCC)    LVEF 40-45% 2011  . CHF (congestive heart failure) (HCC)   . Chronic insomnia   . Coronary atherosclerosis of native coronary artery    60% LAD in 2002  . DJD (degenerative joint disease)    Left THA  . Essential hypertension, benign   . GERD (gastroesophageal reflux disease)   . Hyperlipidemia   . Hypothyroidism   . Left bundle branch block   . Nephrolithiasis   . Obesity   . Paroxysmal atrial flutter [facility])    Documented January 2018    Past Surgical History:  Procedure Laterality Date  . ABDOMINAL HYSTERECTOMY     (partial)  . APPENDECTOMY    . CHOLECYSTECTOMY    . COLONOSCOPY  Aug 2013   Dr. Loreta Ave, [facility]: small internal hemorrhoids and few scattered sigmoid diverticula, otherwise normal up to TI, TI appeared normal  . ESOPHAGOGASTRODUODENOSCOPY N/A 06/01/2012   RMR:5 cm hiatal hernia. Essentially normal esophagus  . HEMORROIDECTOMY    .  JOINT REPLACEMENT    . NOSE SURGERY    . REVISION TOTAL HIP ARTHROPLASTY  2009   Left THA 2009    Current Outpatient Medications  Medication Sig Dispense Refill  . Calcium Carbonate-Vitamin D (CALCIUM 600 + D PO) Take 1 tablet by mouth daily.     Marland Kitchen ELIQUIS 5 MG TABS tablet TAKE ONE TABLET BY MOUTH TWICE DAILY 60 tablet 11  . furosemide (LASIX) 40 MG tablet Take 20 mg by mouth daily as needed for fluid or edema.     . gabapentin (NEURONTIN) 100 MG capsule Take 100 mg by mouth at bedtime.    . isosorbide mononitrate (IMDUR) 30 MG 24 hr tablet TAKE ONE TABLET BY MOUTH EVERY DAY 30 tablet 11  . lisinopril (PRINIVIL,ZESTRIL) 20 MG tablet Take 20 mg by mouth 2 (two) times daily.     . metoprolol succinate (TOPROL-XL) 25 MG 24 hr tablet TAKE ONE-HALF TABLET BY MOUTH EVERY EVENING 15 tablet 11  . Multiple Vitamin (MULTIVITAMIN WITH MINERALS) TABS Take 1 tablet by mouth daily.    . nitroGLYCERIN (NITROSTAT) 0.4 MG SL tablet Place 1 tablet (0.4 mg total) under the tongue every 5 (five) minutes as needed. For chest pain 25 tablet 3  . omeprazole (PRILOSEC) 20 MG capsule Take 20 mg by mouth every morning.     . pravastatin (PRAVACHOL) 40 MG tablet Take 40 mg by mouth at bedtime.     Marland Kitchen  tamsulosin (FLOMAX) 0.4 MG CAPS capsule Take 1 capsule (0.4 mg total) by mouth daily. 30 capsule 0   No current facility-administered medications for this visit.    Allergies:  Codeine and Tape   Social History: The patient  reports that she has never smoked. She has never used smokeless tobacco. She reports that she does not drink alcohol or use drugs.   ROS:  Please see the history of present illness. Otherwise, complete review of systems is positive for chronic leg swelling.  All other systems are reviewed and negative.   Physical Exam: VS:  BP 136/80   Pulse 94   Ht 5\' 3"  (1.6 m)   Wt 200 lb (90.7 kg)   SpO2 97%   BMI 35.43 kg/m , BMI Body mass index is 35.43 kg/m.  Wt Readings from Last 3 Encounters:    08/05/17 200 lb (90.7 kg)  02/03/17 217 lb (98.4 kg)  08/06/16 212 lb (96.2 kg)    General: Patient appears comfortable at rest. HEENT: Conjunctiva and lids normal, oropharynx clear. Neck: Supple, no elevated JVP or carotid bruits, no thyromegaly. Lungs: Clear to auscultation, nonlabored breathing at rest. Cardiac: Irregularly irregular, no S3, 2/6 systolic murmur, no pericardial rub. Abdomen: Soft, nontender, bowel sounds present. Extremities: Chronic appearing leg edema, distal pulses 2+. Skin: Warm and dry. Musculoskeletal: No kyphosis. Neuropsychiatric: Alert and oriented x3, affect grossly appropriate.  ECG: I personally reviewed the tracing from 05/01/2016 which shows sinus rhythm with left bundle branch block.  Recent Labwork:  04/23/2016: BUN 18; Creatinine, Ser 0.88; Hemoglobin 11.2; Platelets 361; Potassium 3.9; Sodium 139   Other Studies Reviewed Today:  Echocardiogram 04/21/2016: Study Conclusions  - Left ventricle: The cavity size was normal. Systolic function was difficult to assess due to tachycardia, but appeared grossly normal. The estimated ejection fraction was in the range of 50% to 55%. Features are consistent with a pseudonormal left ventricular filling pattern, with concomitant abnormal relaxation and increased filling pressure (grade 2 diastolic dysfunction). Doppler parameters are consistent with high ventricular filling pressure. Mild to moderate concentric LVH. - Ventricular septum: Septal motion showed abnormal function and dyssynergy. These changes are consistent with intraventricular conduction delay. - Mitral valve: Moderately calcified annulus. Normal thickness leaflets . There was moderate regurgitation. - Left atrium: The atrium was moderately to severely dilated. - Right atrium: The atrium was mildly dilated. - Atrial septum: A patent foramen ovale cannot be excluded. - Tricuspid valve: There was mild regurgitation. -  Pulmonary arteries: PA peak pressure: 32 mm Hg (S).  Assessment and Plan:  1.  Paroxysmal atypical atrial flutter.  Rhythm is present today.  She does not report palpitations or obvious shortness of breath.  Plan to continue anticoagulation with Eliquis and heart rate control with Toprol-XL.  Requesting most recent lab work from Dr. [name].  2.  Nonischemic cardiomyopathy with LVEF 50-55% by follow-up echocardiogram last year.  3.  Essential hypertension, systolic blood pressure in the 130s today.  No changes to current regimen.  Keep follow-up with Dr. [name].  4.  CAD with moderate LAD stenosis, no angina symptoms.  She continues on statin therapy.  Current medicines were reviewed with the patient today.   Orders Placed This Encounter  Procedures  . EKG 12-Lead    Disposition: Follow-up in 6 months.  Signed, Jonelle Sidle, MD, [facility] 08/05/2017 1:08 PM    [facility] at [facility] [address] [place], [place] [address] Phone: [phone]; Fax: [phone]

## 2017-08-05 ENCOUNTER — Encounter: Payer: Self-pay | Admitting: Cardiology

## 2017-08-05 ENCOUNTER — Ambulatory Visit (INDEPENDENT_AMBULATORY_CARE_PROVIDER_SITE_OTHER): Payer: Medicare Other | Admitting: Cardiology

## 2017-08-05 VITALS — BP 136/80 | HR 94 | Ht 63.0 in | Wt 200.0 lb

## 2017-08-05 DIAGNOSIS — Z8679 Personal history of other diseases of the circulatory system: Secondary | ICD-10-CM | POA: Diagnosis not present

## 2017-08-05 DIAGNOSIS — I484 Atypical atrial flutter: Secondary | ICD-10-CM

## 2017-08-05 DIAGNOSIS — I10 Essential (primary) hypertension: Secondary | ICD-10-CM | POA: Diagnosis not present

## 2017-08-05 DIAGNOSIS — I2510 Atherosclerotic heart disease of native coronary artery without angina pectoris: Secondary | ICD-10-CM | POA: Diagnosis not present

## 2017-08-05 NOTE — Patient Instructions (Signed)
Your physician wants you to follow-up in:6 months with Dr.McDowell You will receive a reminder letter in the mail two months in advance. If you don't receive a letter, please call our office to schedule the follow-up appointment.   Your physician recommends that you continue on your current medications as directed. Please refer to the Current Medication list given to you today.   If you need a refill on your cardiac medications before your next appointment, please call your pharmacy.    No lab work or tests ordered today.      Thank you for choosing [facility] !         

## 2017-08-30 DIAGNOSIS — Z0000 Encounter for general adult medical examination without abnormal findings: Secondary | ICD-10-CM | POA: Diagnosis not present

## 2017-08-30 DIAGNOSIS — I10 Essential (primary) hypertension: Secondary | ICD-10-CM | POA: Diagnosis not present

## 2017-08-30 DIAGNOSIS — N390 Urinary tract infection, site not specified: Secondary | ICD-10-CM | POA: Diagnosis not present

## 2017-08-30 DIAGNOSIS — R05 Cough: Secondary | ICD-10-CM | POA: Diagnosis not present

## 2017-08-30 DIAGNOSIS — E785 Hyperlipidemia, unspecified: Secondary | ICD-10-CM | POA: Diagnosis not present

## 2017-10-04 DIAGNOSIS — Z8679 Personal history of other diseases of the circulatory system: Secondary | ICD-10-CM | POA: Diagnosis not present

## 2017-10-04 DIAGNOSIS — I484 Atypical atrial flutter: Secondary | ICD-10-CM | POA: Diagnosis not present

## 2017-10-04 DIAGNOSIS — I2510 Atherosclerotic heart disease of native coronary artery without angina pectoris: Secondary | ICD-10-CM | POA: Diagnosis not present

## 2017-10-04 DIAGNOSIS — I10 Essential (primary) hypertension: Secondary | ICD-10-CM | POA: Diagnosis not present

## 2017-10-18 DIAGNOSIS — Z961 Presence of intraocular lens: Secondary | ICD-10-CM | POA: Diagnosis not present

## 2017-10-18 DIAGNOSIS — H04123 Dry eye syndrome of bilateral lacrimal glands: Secondary | ICD-10-CM | POA: Diagnosis not present

## 2017-10-18 DIAGNOSIS — R51 Headache: Secondary | ICD-10-CM | POA: Diagnosis not present

## 2017-11-03 DIAGNOSIS — E785 Hyperlipidemia, unspecified: Secondary | ICD-10-CM | POA: Diagnosis not present

## 2017-11-03 DIAGNOSIS — E039 Hypothyroidism, unspecified: Secondary | ICD-10-CM | POA: Diagnosis not present

## 2017-11-03 DIAGNOSIS — I10 Essential (primary) hypertension: Secondary | ICD-10-CM | POA: Diagnosis not present

## 2017-11-03 DIAGNOSIS — R42 Dizziness and giddiness: Secondary | ICD-10-CM | POA: Diagnosis not present

## 2017-12-07 ENCOUNTER — Other Ambulatory Visit (HOSPITAL_COMMUNITY): Payer: Self-pay | Admitting: Family Medicine

## 2017-12-07 DIAGNOSIS — Z1231 Encounter for screening mammogram for malignant neoplasm of breast: Secondary | ICD-10-CM

## 2018-01-03 DIAGNOSIS — Z7901 Long term (current) use of anticoagulants: Secondary | ICD-10-CM | POA: Diagnosis not present

## 2018-01-03 DIAGNOSIS — I10 Essential (primary) hypertension: Secondary | ICD-10-CM | POA: Diagnosis not present

## 2018-01-12 ENCOUNTER — Ambulatory Visit (HOSPITAL_COMMUNITY)
Admission: RE | Admit: 2018-01-12 | Discharge: 2018-01-12 | Disposition: A | Discharge status: Home / Self Care | Payer: Medicare Other | Source: Ambulatory Visit | Attending: Family Medicine | Admitting: Family Medicine

## 2018-01-12 DIAGNOSIS — Z1231 Encounter for screening mammogram for malignant neoplasm of breast: Secondary | ICD-10-CM | POA: Insufficient documentation

## 2018-02-04 NOTE — Progress Notes (Signed)
Cardiology Office Note  Date: 02/07/2018   ID: [name], DOB 23-Jun-1940, MRN [id number]  PCP: [name], MD  Primary Cardiologist: [name], MD   Chief Complaint  Patient presents with  . Atrial Flutter    History of Present Illness: [name] is a 77 y.o. female last seen in April.  She is here for a routine visit.  Reports occasional palpitations, no progression.  No unusual shortness of breath with typical activities including dancing which she continues to enjoy.  She does not indicate any exertional chest pain.  She continues to follow with Dr. [name], states that she had lab work about a month ago which we will request.  She remains on Eliquis and denies any significant bleeding problems or changes in stool.  I reviewed her remaining medications which are outlined below.  She reports compliance.  Echocardiogram from last year revealed LVEF 50 to 55% range.  Past Medical History:  Diagnosis Date  . Anemia   . Cardiomyopathy (HCC)    LVEF 40-45% 2011  . CHF (congestive heart failure) (HCC)   . Chronic insomnia   . Coronary atherosclerosis of native coronary artery    60% LAD in 2002  . DJD (degenerative joint disease)    Left THA  . Essential hypertension, benign   . GERD (gastroesophageal reflux disease)   . Hyperlipidemia   . Hypothyroidism   . Left bundle branch block   . Nephrolithiasis   . Obesity   . Paroxysmal atrial flutter [facility])    Documented January 2018    Past Surgical History:  Procedure Laterality Date  . ABDOMINAL HYSTERECTOMY     (partial)  . APPENDECTOMY    . CHOLECYSTECTOMY    . COLONOSCOPY  Aug 2013   Dr. Loreta Ave, [facility]: small internal hemorrhoids and few scattered sigmoid diverticula, otherwise normal up to TI, TI appeared normal  . ESOPHAGOGASTRODUODENOSCOPY N/A 06/01/2012   RMR:5 cm hiatal hernia. Essentially normal esophagus  . HEMORROIDECTOMY    . JOINT REPLACEMENT    . NOSE SURGERY    .  REVISION TOTAL HIP ARTHROPLASTY  2009   Left THA 2009    Current Outpatient Medications  Medication Sig Dispense Refill  . Calcium Carbonate-Vitamin D (CALCIUM 600 + D PO) Take 1 tablet by mouth daily.     Marland Kitchen ELIQUIS 5 MG TABS tablet TAKE ONE TABLET BY MOUTH TWICE DAILY 60 tablet 11  . furosemide (LASIX) 40 MG tablet Take 20 mg by mouth daily as needed for fluid or edema.     . gabapentin (NEURONTIN) 100 MG capsule Take 100 mg by mouth at bedtime.    . isosorbide mononitrate (IMDUR) 30 MG 24 hr tablet TAKE ONE TABLET BY MOUTH EVERY DAY 30 tablet 11  . lisinopril (PRINIVIL,ZESTRIL) 20 MG tablet Take 20 mg by mouth 2 (two) times daily.     . metoprolol succinate (TOPROL-XL) 25 MG 24 hr tablet TAKE ONE-HALF TABLET BY MOUTH EVERY EVENING 15 tablet 11  . Multiple Vitamin (MULTIVITAMIN WITH MINERALS) TABS Take 1 tablet by mouth daily.    . nitroGLYCERIN (NITROSTAT) 0.4 MG SL tablet Place 1 tablet (0.4 mg total) under the tongue every 5 (five) minutes as needed. For chest pain 25 tablet 3  . omeprazole (PRILOSEC) 20 MG capsule Take 20 mg by mouth every morning.     . pravastatin (PRAVACHOL) 40 MG tablet Take 40 mg by mouth at bedtime.     . tamsulosin (FLOMAX) 0.4  MG CAPS capsule Take 1 capsule (0.4 mg total) by mouth daily. 30 capsule 0   No current facility-administered medications for this visit.    Allergies:  Codeine and Tape   Social History: The patient  reports that she has never smoked. She has never used smokeless tobacco. She reports that she does not drink alcohol or use drugs.   ROS:  Please see the history of present illness. Otherwise, complete review of systems is positive for none.  All other systems are reviewed and negative.   Physical Exam: VS:  BP 136/82   Pulse 82   Ht 5\' 3"  (1.6 m)   Wt 207 lb 3.2 oz (94 kg)   SpO2 97%   BMI 36.70 kg/m , BMI Body mass index is 36.7 kg/m.  Wt Readings from Last 3 Encounters:  02/07/18 207 lb 3.2 oz (94 kg)  08/05/17 200 lb (90.7  kg)  02/03/17 217 lb (98.4 kg)    General: Patient appears comfortable at rest. HEENT: Conjunctiva and lids normal, oropharynx clear. Neck: Supple, no elevated JVP or carotid bruits, no thyromegaly. Lungs: Clear to auscultation, nonlabored breathing at rest. Cardiac: Irregular, no S3, 2/6 systolic murmur. Abdomen: Soft, nontender, bowel sounds present, no guarding or rebound. Extremities: Chronic lower leg edema, distal pulses 2+. Skin: Warm and dry. Musculoskeletal: No kyphosis. Neuropsychiatric: Alert and oriented x3, affect grossly appropriate.  ECG: I personally reviewed the tracing from 08/05/2017 which showed sinus rhythm with left bundle branch block.  Recent Labwork:  April 2018: Potassium 4.5, BUN 19, creatinine 0.8, AST 16, ALT 9, hemoglobin 12.7, platelets 247  Other Studies Reviewed Today:  Echocardiogram 04/21/2016: Study Conclusions  - Left ventricle: The cavity size was normal. Systolic function was   difficult to assess due to tachycardia, but appeared grossly   normal. The estimated ejection fraction was in the range of 50%   to 55%. Features are consistent with a pseudonormal left   ventricular filling pattern, with concomitant abnormal relaxation   and increased filling pressure (grade 2 diastolic dysfunction).   Doppler parameters are consistent with high ventricular filling   pressure. Mild to moderate concentric LVH. - Ventricular septum: Septal motion showed abnormal function and   dyssynergy. These changes are consistent with intraventricular   conduction delay. - Mitral valve: Moderately calcified annulus. Normal thickness   leaflets . There was moderate regurgitation. - Left atrium: The atrium was moderately to severely dilated. - Right atrium: The atrium was mildly dilated. - Atrial septum: A patent foramen ovale cannot be excluded. - Tricuspid valve: There was mild regurgitation. - Pulmonary arteries: PA peak pressure: 32 mm Hg (S).  Lexiscan  Myoview 08/28/2015:  This is a low risk study.  Nuclear stress EF: 63%.  Defect 1: There is a defect present in the mid anteroseptal, apical anterior and apical septal location. There is a mild degree of reversibility. While LBBB is likely contributing to defect, a small degree of ischemia cannot entirely be excluded.  Assessment and Plan:  1.  Persistent atypical atrial flutter.  Heart rate is controlled on Toprol-XL which remains on Eliquis for stroke prophylaxis.  Requesting lab work from Dr. [name].  She does not report any bleeding problems.  2.  Nonischemic cardiomyopathy with LVEF 50 to 55% as of echocardiogram last year.  3.  Essential hypertension, blood pressure is adequately controlled today.  Keep follow-up with Dr. [name].  4.  CAD with history of moderate LAD stenosis but no active angina symptoms.  She is  not on statin due to concurrent use of Eliquis.  Continue statin therapy.  Current medicines were reviewed with the patient today.  Disposition: Follow-up in 6 months.  Signed, Jonelle Sidle, MD, [facility] 02/07/2018 11:17 AM    [facility] [address] [place], [place] [address] Phone: [phone]; Fax: [phone]

## 2018-02-07 ENCOUNTER — Encounter: Payer: Self-pay | Admitting: Cardiology

## 2018-02-07 ENCOUNTER — Ambulatory Visit (INDEPENDENT_AMBULATORY_CARE_PROVIDER_SITE_OTHER): Payer: Medicare Other | Admitting: Cardiology

## 2018-02-07 VITALS — BP 136/82 | HR 82 | Ht 63.0 in | Wt 207.2 lb

## 2018-02-07 DIAGNOSIS — I484 Atypical atrial flutter: Secondary | ICD-10-CM

## 2018-02-07 DIAGNOSIS — I10 Essential (primary) hypertension: Secondary | ICD-10-CM

## 2018-02-07 DIAGNOSIS — I2510 Atherosclerotic heart disease of native coronary artery without angina pectoris: Secondary | ICD-10-CM

## 2018-02-07 DIAGNOSIS — Z8679 Personal history of other diseases of the circulatory system: Secondary | ICD-10-CM | POA: Diagnosis not present

## 2018-02-07 NOTE — Patient Instructions (Signed)
Medication Instructions:  Your physician recommends that you continue on your current medications as directed. Please refer to the Current Medication list given to you today.  If you need a refill on your cardiac medications before your next appointment, please call your pharmacy.   Lab work: none If you have labs (blood work) drawn today and your tests are completely normal, you will receive your results only by: . MyChart Message (if you have MyChart) OR . A paper copy in the mail If you have any lab test that is abnormal or we need to change your treatment, we will call you to review the results.  Testing/Procedures: none  Follow-Up: At [facility], you and your health needs are our priority.  As part of our continuing mission to provide you with exceptional heart care, we have created designated Provider Care Teams.  These Care Teams include your primary Cardiologist (physician) and Advanced Practice Providers (APPs -  Physician Assistants and Nurse Practitioners) who all work together to provide you with the care you need, when you need it. You will need a follow up appointment in 6 months.  Please call our office 2 months in advance to schedule this appointment.  You may see Samuel McDowell, MD or one of the following Advanced Practice Providers on your designated Care Team:   Brittany Strader, PA-C ([place] Office) . Michele Lenze, PA-C ([place] Office)  Any Other Special Instructions Will Be Listed Below (If Applicable). none   

## 2018-04-01 ENCOUNTER — Other Ambulatory Visit: Payer: Self-pay | Admitting: Cardiology

## 2018-04-04 DIAGNOSIS — Z8679 Personal history of other diseases of the circulatory system: Secondary | ICD-10-CM | POA: Diagnosis not present

## 2018-04-04 DIAGNOSIS — Z7901 Long term (current) use of anticoagulants: Secondary | ICD-10-CM | POA: Diagnosis not present

## 2018-04-04 DIAGNOSIS — I10 Essential (primary) hypertension: Secondary | ICD-10-CM | POA: Diagnosis not present

## 2018-04-04 DIAGNOSIS — I480 Paroxysmal atrial fibrillation: Secondary | ICD-10-CM | POA: Diagnosis not present

## 2018-04-04 DIAGNOSIS — I2510 Atherosclerotic heart disease of native coronary artery without angina pectoris: Secondary | ICD-10-CM | POA: Diagnosis not present

## 2018-05-10 ENCOUNTER — Other Ambulatory Visit: Payer: Self-pay | Admitting: Cardiology

## 2018-06-30 ENCOUNTER — Encounter: Payer: Self-pay | Admitting: Podiatry

## 2018-06-30 ENCOUNTER — Ambulatory Visit (INDEPENDENT_AMBULATORY_CARE_PROVIDER_SITE_OTHER): Payer: Medicare Other

## 2018-06-30 ENCOUNTER — Other Ambulatory Visit: Payer: Self-pay

## 2018-06-30 ENCOUNTER — Ambulatory Visit (INDEPENDENT_AMBULATORY_CARE_PROVIDER_SITE_OTHER): Payer: Medicare Other | Admitting: Podiatry

## 2018-06-30 DIAGNOSIS — M79672 Pain in left foot: Secondary | ICD-10-CM | POA: Diagnosis not present

## 2018-06-30 DIAGNOSIS — M79671 Pain in right foot: Secondary | ICD-10-CM | POA: Diagnosis not present

## 2018-06-30 DIAGNOSIS — M722 Plantar fascial fibromatosis: Secondary | ICD-10-CM

## 2018-06-30 DIAGNOSIS — L600 Ingrowing nail: Secondary | ICD-10-CM | POA: Diagnosis not present

## 2018-06-30 DIAGNOSIS — M779 Enthesopathy, unspecified: Secondary | ICD-10-CM | POA: Diagnosis not present

## 2018-06-30 MED ORDER — TRIAMCINOLONE ACETONIDE 10 MG/ML IJ SUSP
10.0000 mg | Freq: Once | INTRAMUSCULAR | Status: AC
  Administered 2018-06-30: 10 mg

## 2018-06-30 NOTE — Progress Notes (Signed)
Subjective:   Patient ID: [name], female   DOB: 78 y.o.   MRN: [id number]   HPI Patient presents stating that the bottom of the right heel has been hurting her for the last few months and that the left ankle has been bothering her for about 6 months and she does get cramps in her left arch.  Patient does not smoke currently and likes to be active and states the pain is increasing in intensity   Review of Systems  All other systems reviewed and are negative.       Objective:  Physical Exam Vitals signs and nursing note reviewed.  Constitutional:      Appearance: She is well-developed.  Pulmonary:     Effort: Pulmonary effort is normal.  Musculoskeletal: Normal range of motion.  Skin:    General: Skin is warm.  Neurological:     Mental Status: She is alert.     Neurovascular status found to be intact muscle strength is adequate range of motion within normal limits with patient found to have exquisite discomfort plantar aspect right heel at the insertion tendon calcaneus in the left there is quite a bit of pain in the sinus tarsi with inflammation noted and no other pathology noted currently.  Patient is found to have good digital perfusion well oriented x3     Assessment:  Acute plantar fasciitis right with inflammation and what appears to be sinus tarsitis left with inflammation     Plan:  H&P both conditions reviewed sterile prep applied and I injected the plantar fascial right 3 mg Kenalog 5 mg Xylocaine in the sinus tarsi left 3 mg Kenalog 5 mg Xylocaine advised on anti-inflammatories as needed and reduced activity and reappoint to recheck again in 2 to 3 weeks  X-ray indicates large plantar spur formation but no indications of advanced arthritis or other pathology Visit

## 2018-07-04 DIAGNOSIS — I10 Essential (primary) hypertension: Secondary | ICD-10-CM | POA: Diagnosis not present

## 2018-07-04 DIAGNOSIS — Z7901 Long term (current) use of anticoagulants: Secondary | ICD-10-CM | POA: Diagnosis not present

## 2018-07-04 DIAGNOSIS — I484 Atypical atrial flutter: Secondary | ICD-10-CM | POA: Diagnosis not present

## 2018-07-14 ENCOUNTER — Ambulatory Visit (INDEPENDENT_AMBULATORY_CARE_PROVIDER_SITE_OTHER): Payer: Medicare Other | Admitting: Podiatry

## 2018-07-14 ENCOUNTER — Encounter: Payer: Self-pay | Admitting: Podiatry

## 2018-07-14 ENCOUNTER — Other Ambulatory Visit: Payer: Self-pay

## 2018-07-14 VITALS — Temp 97.9°F

## 2018-07-14 DIAGNOSIS — M779 Enthesopathy, unspecified: Secondary | ICD-10-CM | POA: Diagnosis not present

## 2018-07-14 DIAGNOSIS — M722 Plantar fascial fibromatosis: Secondary | ICD-10-CM | POA: Diagnosis not present

## 2018-07-14 MED ORDER — TRIAMCINOLONE ACETONIDE 10 MG/ML IJ SUSP
10.0000 mg | Freq: Once | INTRAMUSCULAR | Status: AC
  Administered 2018-07-14: 10:00:00 10 mg

## 2018-07-14 NOTE — Progress Notes (Signed)
Subjective:   Patient ID: [name], female   DOB: 78 y.o.   MRN: [id number]   HPI Patient presents stating the right heel is still really bothering her and the left ankle seems improved quite a bit with pain improved right but feels like she needs more support and that her ankle gives out   ROS      Objective:  Physical Exam  Neurovascular status intact with patient's right plantar fascia still sore upon palpation with fluid buildup and patient on the left noted to have improvement of the sinus tarsi capsular inflammation     Assessment:  Continuation of plantar fasciitis right with mild instability with inflammatory condition left which appears to be improved sinus tarsi capsule     Plan:  H&P conditions reviewed and today I did a sterile prep right I injected the plantar fascia 3 mg Kenalog 5 mg Xylocaine and I applied fascial brace to lift the arch up and for the left I recommended continuation of supportive shoes ice therapy and patient will be seen back 3 weeks or earlier if needed

## 2018-08-04 ENCOUNTER — Ambulatory Visit: Payer: Medicare Other | Admitting: Podiatry

## 2018-08-08 ENCOUNTER — Ambulatory Visit: Payer: Medicare Other | Admitting: Podiatry

## 2018-08-22 ENCOUNTER — Ambulatory Visit: Payer: Medicare Other | Admitting: Cardiology

## 2018-08-23 ENCOUNTER — Other Ambulatory Visit: Payer: Self-pay | Admitting: Podiatry

## 2018-08-23 DIAGNOSIS — M722 Plantar fascial fibromatosis: Secondary | ICD-10-CM

## 2018-10-03 DIAGNOSIS — Z7901 Long term (current) use of anticoagulants: Secondary | ICD-10-CM | POA: Diagnosis not present

## 2018-10-03 DIAGNOSIS — I484 Atypical atrial flutter: Secondary | ICD-10-CM | POA: Diagnosis not present

## 2018-10-03 DIAGNOSIS — I10 Essential (primary) hypertension: Secondary | ICD-10-CM | POA: Diagnosis not present

## 2018-10-19 ENCOUNTER — Ambulatory Visit (INDEPENDENT_AMBULATORY_CARE_PROVIDER_SITE_OTHER): Payer: Medicare Other | Admitting: Cardiology

## 2018-10-19 ENCOUNTER — Encounter: Payer: Self-pay | Admitting: Cardiology

## 2018-10-19 ENCOUNTER — Other Ambulatory Visit: Payer: Self-pay

## 2018-10-19 VITALS — BP 133/82 | HR 98 | Temp 98.7°F | Ht 63.0 in | Wt 212.0 lb

## 2018-10-19 DIAGNOSIS — I484 Atypical atrial flutter: Secondary | ICD-10-CM

## 2018-10-19 DIAGNOSIS — Z8679 Personal history of other diseases of the circulatory system: Secondary | ICD-10-CM | POA: Diagnosis not present

## 2018-10-19 DIAGNOSIS — I10 Essential (primary) hypertension: Secondary | ICD-10-CM | POA: Diagnosis not present

## 2018-10-19 DIAGNOSIS — I2510 Atherosclerotic heart disease of native coronary artery without angina pectoris: Secondary | ICD-10-CM | POA: Diagnosis not present

## 2018-10-19 NOTE — Patient Instructions (Signed)

## 2018-10-19 NOTE — Progress Notes (Signed)
Cardiology Office Note  Date: 10/19/2018   ID: [name], DOB 01-Feb-1941, MRN [id number]  PCP:  [name], Gerald, MD  Cardiologist:  [name] , MD Electrophysiologist:  None   Chief Complaint  Patient presents with  . Cardiac follow-up    History of Present Illness: [name] is a 78 y.o. female last seen in October 2019.  She presents for a routine visit.  She states of only an occasional sense of palpitations, sometimes dyspnea on exertion when she "overdoes it."  She has had mild intermittent lightheadedness but no syncope.  No exertional chest pain.  She continues to follow with Dr. Loleta ChanceHill, states she had recent lab work in late June which we are requesting.  Cardiac regimen includes Eliquis and Toprol-XL.  She reports compliance.  No obvious bleeding problems.  Past Medical History:  Diagnosis Date  . Anemia   . Cardiomyopathy (HCC)    LVEF 40-45% 2011  . CHF (congestive heart failure) (HCC)   . Chronic insomnia   . Coronary atherosclerosis of native coronary artery    60% LAD in 2002  . DJD (degenerative joint disease)    Left THA  . Essential hypertension   . GERD (gastroesophageal reflux disease)   . Hyperlipidemia   . Hypothyroidism   . Left bundle branch block   . Nephrolithiasis   . Obesity   . Paroxysmal atrial flutter [facility](HCC)    Documented January 2018    Past Surgical History:  Procedure Laterality Date  . ABDOMINAL HYSTERECTOMY     (partial)  . APPENDECTOMY    . CHOLECYSTECTOMY    . COLONOSCOPY  Aug 2013   [facility] AveMann[facility]: small internal hemorrhoids and few scattered sigmoid diverticula, otherwise normal up to TI, TI appeared normal  . ESOPHAGOGASTRODUODENOSCOPY N/A 06/01/2012   RMR:5 cm hiatal hernia. Essentially normal esophagus  . HEMORROIDECTOMY    . JOINT REPLACEMENT    . NOSE SURGERY    . REVISION TOTAL HIP ARTHROPLASTY  2009   Left THA 2009    Current Outpatient Medications  Medication Sig Dispense  Refill  . Calcium Carbonate-Vitamin D (CALCIUM 600 + D PO) Take 1 tablet by mouth daily.     Marland Kitchen. ELIQUIS 5 MG TABS tablet Take 1 tablet by mouth twice daily 60 tablet 11  . furosemide (LASIX) 40 MG tablet Take 20 mg by mouth daily as needed for fluid or edema.     . gabapentin (NEURONTIN) 100 MG capsule Take 100 mg by mouth at bedtime.    . isosorbide mononitrate (IMDUR) 30 MG 24 hr tablet Take 1 tablet by mouth every day 30 tablet 14  . lisinopril (PRINIVIL,ZESTRIL) 20 MG tablet Take 20 mg by mouth 2 (two) times daily.     . metoprolol succinate (TOPROL-XL) 25 MG 24 hr tablet Take one-half tablet by mouth every evening 15 tablet 11  . Multiple Vitamin (MULTIVITAMIN WITH MINERALS) TABS Take 1 tablet by mouth daily.    . nitroGLYCERIN (NITROSTAT) 0.4 MG SL tablet Place 1 tablet (0.4 mg total) under the tongue every 5 (five) minutes as needed. For chest pain 25 tablet 3  . omeprazole (PRILOSEC) 20 MG capsule Take 20 mg by mouth every morning.     . pravastatin (PRAVACHOL) 40 MG tablet Take 40 mg by mouth at bedtime.     . tamsulosin (FLOMAX) 0.4 MG CAPS capsule Take 1 capsule (0.4 mg total) by mouth daily. 30 capsule 0   No current facility-administered  medications for this visit.    Allergies:  Codeine and Tape   Social History: The patient  reports that she has never smoked. She has never used smokeless tobacco. She reports that she does not drink alcohol or use drugs.   ROS:  Please see the history of present illness. Otherwise, complete review of systems is positive for none.  All other systems are reviewed and negative.   Physical Exam: VS:  BP 133/82   Pulse 98   Temp 98.7 F (37.1 C)   Ht 5\' 3"  (1.6 m)   Wt 212 lb (96.2 kg)   SpO2 97%   BMI 37.55 kg/m , BMI Body mass index is 37.55 kg/m.  Wt Readings from Last 3 Encounters:  10/19/18 212 lb (96.2 kg)  02/07/18 207 lb 3.2 oz (94 kg)  08/05/17 200 lb (90.7 kg)    General: Patient appears comfortable at rest. HEENT:  Conjunctiva and lids normal, oropharynx clear. Neck: Supple, no elevated JVP or carotid bruits, no thyromegaly. Lungs: Clear to auscultation, nonlabored breathing at rest. Cardiac: Regular rate and rhythm, no S3, 2/6 systolic murmur. Abdomen: Soft, nontender, bowel sounds present. Extremities: Chronic appearing lower leg edema, distal pulses 2+. Skin: Warm and dry. Musculoskeletal: No kyphosis. Neuropsychiatric: Alert and oriented x3, affect grossly appropriate.  ECG:  An ECG dated 08/05/2017 was personally reviewed today and demonstrated:  Sinus rhythm with left bundle branch block.  Recent Labwork:  April 2018: Potassium 4.5, BUN 19, creatinine 0.8, AST 16, ALT 9, hemoglobin 12.7, platelets 247  Other Studies Reviewed Today:  Echocardiogram 04/21/2016: Study Conclusions  - Left ventricle: The cavity size was normal. Systolic function was difficult to assess due to tachycardia, but appeared grossly normal. The estimated ejection fraction was in the range of 50% to 55%. Features are consistent with a pseudonormal left ventricular filling pattern, with concomitant abnormal relaxation and increased filling pressure (grade 2 diastolic dysfunction). Doppler parameters are consistent with high ventricular filling pressure. Mild to moderate concentric LVH. - Ventricular septum: Septal motion showed abnormal function and dyssynergy. These changes are consistent with intraventricular conduction delay. - Mitral valve: Moderately calcified annulus. Normal thickness leaflets . There was moderate regurgitation. - Left atrium: The atrium was moderately to severely dilated. - Right atrium: The atrium was mildly dilated. - Atrial septum: A patent foramen ovale cannot be excluded. - Tricuspid valve: There was mild regurgitation. - Pulmonary arteries: PA peak pressure: 32 mm Hg (S).  Lexiscan Myoview 08/28/2015:  This is a low risk study.  Nuclear stress EF: 63%.   Defect 1: There is a defect present in the mid anteroseptal, apical anterior and apical septal location. There is a mild degree of reversibility. While LBBB is likely contributing to defect, a small degree of ischemia cannot entirely be excluded.  Assessment and Plan:  1.  Persistent atypical atrial flutter.  Plan to continue Toprol-XL for heart rate control and Eliquis for stroke prophylaxis.  She does not report any bleeding problems.  Requesting most recent lab work from PCP.  2.  History of nonischemic cardiomyopathy with improvement in LVEF to the range of 50 to 55% on medical therapy.  She does not report any new heart failure symptoms.  3.  Essential hypertension, blood pressure is adequately controlled today.  Keep follow-up with PCP, she continues on lisinopril as well.  4.  History of moderate LAD distribution CAD, no active angina symptoms.  She is not on aspirin given concurrent use of Eliquis and maintains a statin  therapy.  Medication Adjustments/Labs and Tests Ordered: Current medicines are reviewed at length with the patient today.  Concerns regarding medicines are outlined above.   Tests Ordered: No orders of the defined types were placed in this encounter.   Medication Changes: No orders of the defined types were placed in this encounter.   Disposition:  Follow up 6 months in the [place] office.  Signed, Satira Sark, MD, [facility] 10/19/2018 11:43 AM    [facility] [place]. [address] [place], [place] [address] Phone: [phone]; Fax: [phone]

## 2018-10-26 DIAGNOSIS — H04123 Dry eye syndrome of bilateral lacrimal glands: Secondary | ICD-10-CM | POA: Diagnosis not present

## 2018-10-26 DIAGNOSIS — Z961 Presence of intraocular lens: Secondary | ICD-10-CM | POA: Diagnosis not present

## 2018-11-02 DIAGNOSIS — H5213 Myopia, bilateral: Secondary | ICD-10-CM | POA: Diagnosis not present

## 2018-12-05 ENCOUNTER — Other Ambulatory Visit (HOSPITAL_COMMUNITY): Payer: Self-pay | Admitting: Family Medicine

## 2018-12-05 DIAGNOSIS — Z1231 Encounter for screening mammogram for malignant neoplasm of breast: Secondary | ICD-10-CM

## 2019-01-03 DIAGNOSIS — M25552 Pain in left hip: Secondary | ICD-10-CM | POA: Diagnosis not present

## 2019-01-03 DIAGNOSIS — Z7901 Long term (current) use of anticoagulants: Secondary | ICD-10-CM | POA: Diagnosis not present

## 2019-01-03 DIAGNOSIS — H524 Presbyopia: Secondary | ICD-10-CM | POA: Diagnosis not present

## 2019-01-03 DIAGNOSIS — I484 Atypical atrial flutter: Secondary | ICD-10-CM | POA: Diagnosis not present

## 2019-01-03 DIAGNOSIS — I10 Essential (primary) hypertension: Secondary | ICD-10-CM | POA: Diagnosis not present

## 2019-01-18 ENCOUNTER — Ambulatory Visit (HOSPITAL_COMMUNITY): Payer: Medicare Other

## 2019-01-25 ENCOUNTER — Ambulatory Visit (HOSPITAL_COMMUNITY): Payer: Medicare Other

## 2019-02-01 ENCOUNTER — Ambulatory Visit (HOSPITAL_COMMUNITY)
Admission: RE | Admit: 2019-02-01 | Discharge: 2019-02-01 | Disposition: A | Discharge status: Home / Self Care | Payer: Medicare Other | Source: Ambulatory Visit | Attending: Family Medicine | Admitting: Family Medicine

## 2019-02-01 ENCOUNTER — Other Ambulatory Visit: Payer: Self-pay

## 2019-02-01 ENCOUNTER — Other Ambulatory Visit: Payer: Self-pay | Admitting: Cardiology

## 2019-02-01 DIAGNOSIS — Z1231 Encounter for screening mammogram for malignant neoplasm of breast: Secondary | ICD-10-CM

## 2019-02-02 ENCOUNTER — Ambulatory Visit (HOSPITAL_COMMUNITY): Payer: Medicare Other

## 2019-02-21 ENCOUNTER — Other Ambulatory Visit: Payer: Self-pay

## 2019-02-21 DIAGNOSIS — Z20822 Contact with and (suspected) exposure to covid-19: Secondary | ICD-10-CM

## 2019-02-22 LAB — NOVEL CORONAVIRUS, NAA: SARS-CoV-2, NAA: NOT DETECTED

## 2019-02-27 ENCOUNTER — Telehealth: Payer: Self-pay

## 2019-02-27 NOTE — Telephone Encounter (Signed)
Patient called and she was told that her COVID-19 test was negative on 02/21/2019.  She verbalized understanding.

## 2019-03-12 ENCOUNTER — Other Ambulatory Visit: Payer: Self-pay | Admitting: Cardiology

## 2019-04-18 DIAGNOSIS — I484 Atypical atrial flutter: Secondary | ICD-10-CM | POA: Diagnosis not present

## 2019-04-18 DIAGNOSIS — I10 Essential (primary) hypertension: Secondary | ICD-10-CM | POA: Diagnosis not present

## 2019-04-18 DIAGNOSIS — R05 Cough: Secondary | ICD-10-CM | POA: Diagnosis not present

## 2019-04-18 DIAGNOSIS — Z7901 Long term (current) use of anticoagulants: Secondary | ICD-10-CM | POA: Diagnosis not present

## 2019-06-04 NOTE — Progress Notes (Signed)
Virtual Visit via Telephone Note   This visit type was conducted due to national recommendations for restrictions regarding the COVID-19 Pandemic (e.g. social distancing) in an effort to limit this patient's exposure and mitigate transmission in our community.  Due to her co-morbid illnesses, this patient is at least at moderate risk for complications without adequate follow up.  This format is felt to be most appropriate for this patient at this time.  The patient did not have access to video technology/had technical difficulties with video requiring transitioning to audio format only (telephone).  All issues noted in this document were discussed and addressed.  No physical exam could be performed with this format.  Please refer to the patient's chart for her  consent to telehealth for [name].   Date:  06/05/2019   ID:  [name], DOB [birth year], MRN [id number]  Patient Location: Home Provider Location: Home  PCP:  [name], MD  Cardiologist:  [name], MD Electrophysiologist:  None   Evaluation Performed:  Follow-Up Visit  Chief Complaint:  Cardiac follow-up  History of Present Illness:    [name] is a 79 y.o. female last seen in July 2020.  We spoke by phone today.  She does not report any obvious angina symptoms or palpitations.  She has had dyspnea on exertion with higher levels of activity.  She does not report a progressive pattern necessarily.  No dizziness or syncope.  She states that she had recent lab work with Dr. [name] back in January.  We are requesting the results for review.  I reviewed her medications which are outlined below and stable from a cardiac perspective.  She does not report any obvious bleeding problems on Eliquis.  Blood pressure well controlled today.  She has a history of cardiomyopathy although echocardiogram from 2018 revealed LVEF 50 to 55% range.  The patient does not have symptoms concerning for COVID-19 infection  (fever, chills, cough, or new shortness of breath).    Past Medical History:  Diagnosis Date  . Anemia   . Cardiomyopathy (HCC)    LVEF 40-45% 2011  . CHF (congestive heart failure) (HCC)   . Chronic insomnia   . Coronary atherosclerosis of native coronary artery    60% LAD in 2002  . DJD (degenerative joint disease)    Left THA  . Essential hypertension   . GERD (gastroesophageal reflux disease)   . Hyperlipidemia   . Hypothyroidism   . Left bundle branch block   . Nephrolithiasis   . Obesity   . Paroxysmal atrial flutter [facility])    Documented January 2018   Past Surgical History:  Procedure Laterality Date  . ABDOMINAL HYSTERECTOMY     (partial)  . APPENDECTOMY    . CHOLECYSTECTOMY    . COLONOSCOPY  Aug 2013   Dr. Loreta Ave, [facility]: small internal hemorrhoids and few scattered sigmoid diverticula, otherwise normal up to TI, TI appeared normal  . ESOPHAGOGASTRODUODENOSCOPY N/A 06/01/2012   RMR:5 cm hiatal hernia. Essentially normal esophagus  . HEMORROIDECTOMY    . JOINT REPLACEMENT    . NOSE SURGERY    . REVISION TOTAL HIP ARTHROPLASTY  2009   Left THA 2009     Current Meds  Medication Sig  . Calcium Carbonate-Vitamin D (CALCIUM 600 + D PO) Take 1 tablet by mouth daily.   Marland Kitchen ELIQUIS 5 MG TABS tablet Take 1 tablet by mouth twice daily  . furosemide (LASIX) 40 MG tablet Take 20 mg  by mouth daily as needed for fluid or edema.   . gabapentin (NEURONTIN) 100 MG capsule Take 100 mg by mouth at bedtime.  . isosorbide mononitrate (IMDUR) 30 MG 24 hr tablet Take 1 tablet by mouth every day  . lisinopril (PRINIVIL,ZESTRIL) 20 MG tablet Take 20 mg by mouth 2 (two) times daily.   . metoprolol succinate (TOPROL-XL) 25 MG 24 hr tablet Take 0.5 tablets by mouth every evening  . Multiple Vitamin (MULTIVITAMIN WITH MINERALS) TABS Take 1 tablet by mouth daily.  . nitroGLYCERIN (NITROSTAT) 0.4 MG SL tablet Place 1 tablet (0.4 mg total) under the tongue every 5 (five)  minutes as needed. For chest pain  . omeprazole (PRILOSEC) 20 MG capsule Take 20 mg by mouth every morning.   . pravastatin (PRAVACHOL) 40 MG tablet Take 40 mg by mouth at bedtime.   . tamsulosin (FLOMAX) 0.4 MG CAPS capsule Take 1 capsule (0.4 mg total) by mouth daily.     Allergies:   Codeine and Tape   ROS:   No syncope.   Prior CV studies:   The following studies were reviewed today:  Echocardiogram 04/21/2016: Study Conclusions  - Left ventricle: The cavity size was normal. Systolic function was difficult to assess due to tachycardia, but appeared grossly normal. The estimated ejection fraction was in the range of 50% to 55%. Features are consistent with a pseudonormal left ventricular filling pattern, with concomitant abnormal relaxation and increased filling pressure (grade 2 diastolic dysfunction). Doppler parameters are consistent with high ventricular filling pressure. Mild to moderate concentric LVH. - Ventricular septum: Septal motion showed abnormal function and dyssynergy. These changes are consistent with intraventricular conduction delay. - Mitral valve: Moderately calcified annulus. Normal thickness leaflets . There was moderate regurgitation. - Left atrium: The atrium was moderately to severely dilated. - Right atrium: The atrium was mildly dilated. - Atrial septum: A patent foramen ovale cannot be excluded. - Tricuspid valve: There was mild regurgitation. - Pulmonary arteries: PA peak pressure: 32 mm Hg (S).  Lexiscan Myoview 08/28/2015:  This is a low risk study.  Nuclear stress EF: 63%.  Defect 1: There is a defect present in the mid anteroseptal, apical anterior and apical septal location. There is a mild degree of reversibility. While LBBB is likely contributing to defect, a small degree of ischemia cannot entirely be excluded.  Labs/Other Tests and Data Reviewed:    EKG:  An ECG dated 08/05/2017 was personally reviewed today and  demonstrated:  Sinus rhythm with left bundle branch block.  Recent Labs:  April 2018: Potassium 4.5, BUN 19, creatinine 0.8, AST 16, ALT 9, hemoglobin 12.7, platelets 247  Wt Readings from Last 3 Encounters:  06/05/19 218 lb (98.9 kg)  10/19/18 212 lb (96.2 kg)  02/07/18 207 lb 3.2 oz (94 kg)     Objective:    Vital Signs:  BP 116/70   Pulse 96   Ht 5\' 3"  (1.6 m)   Wt 218 lb (98.9 kg)   BMI 38.62 kg/m    Patient spoke in full sentences, not short of breath. No audible wheezing or coughing. Speech pattern normal.  ASSESSMENT & PLAN:    1.  Persistent atypical atrial flutter.  We have continued strategy of heart rate control with Toprol-XL and stroke prophylaxis with Eliquis.  Requesting recent lab work from Dr. Berdine Addison.  She does not report any active bleeding problems.  2.  History of nonischemic cardiomyopathy.  LVEF 50 to 55% as of 2018.  In light of  dyspnea on exertion, we will obtain a follow-up echocardiogram for her next encounter.  3.  Essential hypertension, blood pressure is well controlled today.  Continue lisinopril and Toprol-XL.  4.  History of nonobstructive CAD, moderate disease in the LAD, no active angina.  Continue statin therapy.  She is also on beta-blocker and long-acting nitrate.   Time:   Today, I have spent 6 minutes with the patient with telehealth technology discussing the above problems.     Medication Adjustments/Labs and Tests Ordered: Current medicines are reviewed at length with the patient today.  Concerns regarding medicines are outlined above.   Tests Ordered: Orders Placed This Encounter  Procedures  . ECHOCARDIOGRAM COMPLETE    Medication Changes: No orders of the defined types were placed in this encounter.   Follow Up:  In Person 6 months in the [place] office.  Signed, [name], MD  06/05/2019 10:01 AM    [facility]

## 2019-06-05 ENCOUNTER — Telehealth (INDEPENDENT_AMBULATORY_CARE_PROVIDER_SITE_OTHER): Payer: Medicare Other | Admitting: Cardiology

## 2019-06-05 ENCOUNTER — Encounter: Payer: Self-pay | Admitting: *Deleted

## 2019-06-05 ENCOUNTER — Encounter: Payer: Self-pay | Admitting: Cardiology

## 2019-06-05 VITALS — BP 116/70 | HR 96 | Ht 63.0 in | Wt 218.0 lb

## 2019-06-05 DIAGNOSIS — I484 Atypical atrial flutter: Secondary | ICD-10-CM | POA: Diagnosis not present

## 2019-06-05 DIAGNOSIS — I10 Essential (primary) hypertension: Secondary | ICD-10-CM

## 2019-06-05 DIAGNOSIS — Z8679 Personal history of other diseases of the circulatory system: Secondary | ICD-10-CM

## 2019-06-05 DIAGNOSIS — I25119 Atherosclerotic heart disease of native coronary artery with unspecified angina pectoris: Secondary | ICD-10-CM | POA: Diagnosis not present

## 2019-06-05 NOTE — Patient Instructions (Signed)
Medication Instructions:  Your physician recommends that you continue on your current medications as directed. Please refer to the Current Medication list given to you today.  *If you need a refill on your cardiac medications before your next appointment, please call your pharmacy*  Lab Work: NONE   If you have labs (blood work) drawn today and your tests are completely normal, you will receive your results only by: Marland Kitchen MyChart Message (if you have MyChart) OR . A paper copy in the mail If you have any lab test that is abnormal or we need to change your treatment, we will call you to review the results.  Testing/Procedures: Your physician has requested that you have an echocardiogram. Echocardiography is a painless test that uses sound waves to create images of your heart. It provides your doctor with information about the size and shape of your heart and how well your heart's chambers and valves are working. This procedure takes approximately one hour. There are no restrictions for this procedure.    Follow-Up: At [facility], you and your health needs are our priority.  As part of our continuing mission to provide you with exceptional heart care, we have created designated Provider Care Teams.  These Care Teams include your primary Cardiologist (physician) and Advanced Practice Providers (APPs -  Physician Assistants and Nurse Practitioners) who all work together to provide you with the care you need, when you need it.  Your next appointment:   6 month(s)  The format for your next appointment:   In Person  Provider:   [name], MD  Other Instructions Thank you for choosing [facility]!

## 2019-07-03 ENCOUNTER — Telehealth: Payer: Self-pay | Admitting: *Deleted

## 2019-07-03 ENCOUNTER — Other Ambulatory Visit: Payer: Self-pay

## 2019-07-03 ENCOUNTER — Emergency Department (HOSPITAL_COMMUNITY): Payer: Medicare Other

## 2019-07-03 ENCOUNTER — Encounter (HOSPITAL_COMMUNITY): Payer: Self-pay

## 2019-07-03 ENCOUNTER — Inpatient Hospital Stay (HOSPITAL_COMMUNITY)
Admission: EM | Admit: 2019-07-03 | Discharge: 2019-07-06 | DRG: 308 | Disposition: A | Discharge status: Home / Self Care | Payer: Medicare Other | Attending: Internal Medicine | Admitting: Internal Medicine

## 2019-07-03 DIAGNOSIS — Z20822 Contact with and (suspected) exposure to covid-19: Secondary | ICD-10-CM | POA: Diagnosis not present

## 2019-07-03 DIAGNOSIS — M1990 Unspecified osteoarthritis, unspecified site: Secondary | ICD-10-CM | POA: Diagnosis not present

## 2019-07-03 DIAGNOSIS — I4821 Permanent atrial fibrillation: Secondary | ICD-10-CM | POA: Diagnosis not present

## 2019-07-03 DIAGNOSIS — I484 Atypical atrial flutter: Secondary | ICD-10-CM | POA: Diagnosis not present

## 2019-07-03 DIAGNOSIS — I5033 Acute on chronic diastolic (congestive) heart failure: Secondary | ICD-10-CM | POA: Diagnosis not present

## 2019-07-03 DIAGNOSIS — Z885 Allergy status to narcotic agent status: Secondary | ICD-10-CM | POA: Diagnosis not present

## 2019-07-03 DIAGNOSIS — I200 Unstable angina: Secondary | ICD-10-CM | POA: Diagnosis not present

## 2019-07-03 DIAGNOSIS — I4892 Unspecified atrial flutter: Secondary | ICD-10-CM | POA: Diagnosis not present

## 2019-07-03 DIAGNOSIS — I110 Hypertensive heart disease with heart failure: Secondary | ICD-10-CM | POA: Diagnosis not present

## 2019-07-03 DIAGNOSIS — E039 Hypothyroidism, unspecified: Secondary | ICD-10-CM | POA: Diagnosis present

## 2019-07-03 DIAGNOSIS — I447 Left bundle-branch block, unspecified: Secondary | ICD-10-CM | POA: Diagnosis present

## 2019-07-03 DIAGNOSIS — I081 Rheumatic disorders of both mitral and tricuspid valves: Secondary | ICD-10-CM | POA: Diagnosis not present

## 2019-07-03 DIAGNOSIS — Z91048 Other nonmedicinal substance allergy status: Secondary | ICD-10-CM | POA: Diagnosis not present

## 2019-07-03 DIAGNOSIS — I340 Nonrheumatic mitral (valve) insufficiency: Secondary | ICD-10-CM | POA: Diagnosis not present

## 2019-07-03 DIAGNOSIS — Z96642 Presence of left artificial hip joint: Secondary | ICD-10-CM | POA: Diagnosis present

## 2019-07-03 DIAGNOSIS — K219 Gastro-esophageal reflux disease without esophagitis: Secondary | ICD-10-CM | POA: Diagnosis not present

## 2019-07-03 DIAGNOSIS — E7849 Other hyperlipidemia: Secondary | ICD-10-CM | POA: Diagnosis not present

## 2019-07-03 DIAGNOSIS — E785 Hyperlipidemia, unspecified: Secondary | ICD-10-CM | POA: Diagnosis present

## 2019-07-03 DIAGNOSIS — Z7901 Long term (current) use of anticoagulants: Secondary | ICD-10-CM

## 2019-07-03 DIAGNOSIS — Z7902 Long term (current) use of antithrombotics/antiplatelets: Secondary | ICD-10-CM

## 2019-07-03 DIAGNOSIS — I2510 Atherosclerotic heart disease of native coronary artery without angina pectoris: Secondary | ICD-10-CM | POA: Diagnosis not present

## 2019-07-03 DIAGNOSIS — F5104 Psychophysiologic insomnia: Secondary | ICD-10-CM | POA: Diagnosis present

## 2019-07-03 DIAGNOSIS — I509 Heart failure, unspecified: Secondary | ICD-10-CM | POA: Diagnosis not present

## 2019-07-03 DIAGNOSIS — I10 Essential (primary) hypertension: Secondary | ICD-10-CM | POA: Diagnosis present

## 2019-07-03 DIAGNOSIS — Z833 Family history of diabetes mellitus: Secondary | ICD-10-CM | POA: Diagnosis not present

## 2019-07-03 DIAGNOSIS — I25119 Atherosclerotic heart disease of native coronary artery with unspecified angina pectoris: Secondary | ICD-10-CM | POA: Diagnosis not present

## 2019-07-03 DIAGNOSIS — I480 Paroxysmal atrial fibrillation: Secondary | ICD-10-CM | POA: Diagnosis not present

## 2019-07-03 DIAGNOSIS — Z8249 Family history of ischemic heart disease and other diseases of the circulatory system: Secondary | ICD-10-CM

## 2019-07-03 DIAGNOSIS — Z6838 Body mass index (BMI) 38.0-38.9, adult: Secondary | ICD-10-CM

## 2019-07-03 DIAGNOSIS — I4891 Unspecified atrial fibrillation: Secondary | ICD-10-CM | POA: Diagnosis not present

## 2019-07-03 DIAGNOSIS — I361 Nonrheumatic tricuspid (valve) insufficiency: Secondary | ICD-10-CM | POA: Diagnosis not present

## 2019-07-03 DIAGNOSIS — R079 Chest pain, unspecified: Secondary | ICD-10-CM | POA: Diagnosis not present

## 2019-07-03 DIAGNOSIS — E038 Other specified hypothyroidism: Secondary | ICD-10-CM | POA: Diagnosis not present

## 2019-07-03 LAB — BASIC METABOLIC PANEL
Anion gap: 8 (ref 5–15)
BUN: 16 mg/dL (ref 8–23)
CO2: 23 mmol/L (ref 22–32)
Calcium: 8.8 mg/dL — ABNORMAL LOW (ref 8.9–10.3)
Chloride: 110 mmol/L (ref 98–111)
Creatinine, Ser: 1 mg/dL (ref 0.44–1.00)
GFR calc Af Amer: >60 mL/min (ref >60)
GFR calc non Af Amer: 55 mL/min — ABNORMAL LOW (ref >60)
Glucose, Bld: 121 mg/dL — ABNORMAL HIGH (ref 70–99)
Potassium: 3.7 mmol/L (ref 3.5–5.1)
Sodium: 141 mmol/L (ref 135–145)

## 2019-07-03 LAB — POC SARS CORONAVIRUS 2 AG -  ED: SARS Coronavirus 2 Ag: NEGATIVE

## 2019-07-03 LAB — CBC
HCT: 38.8 % (ref 36.0–46.0)
Hemoglobin: 12.1 g/dL (ref 12.0–15.0)
MCH: 30 pg (ref 26.0–34.0)
MCHC: 31.2 g/dL (ref 30.0–36.0)
MCV: 96.3 fL (ref 80.0–100.0)
Platelets: 248 10*3/uL (ref 150–400)
RBC: 4.03 MIL/uL (ref 3.87–5.11)
RDW: 14 % (ref 11.5–15.5)
WBC: 6.8 10*3/uL (ref 4.0–10.5)
nRBC: 0 % (ref 0.0–0.2)

## 2019-07-03 LAB — D-DIMER, QUANTITATIVE: D-Dimer, Quant: 0.3 ug/mL-FEU (ref 0.00–0.50)

## 2019-07-03 LAB — TROPONIN I (HIGH SENSITIVITY)
Troponin I (High Sensitivity): 4 ng/L (ref <18)
Troponin I (High Sensitivity): 5 ng/L (ref <18)

## 2019-07-03 LAB — BRAIN NATRIURETIC PEPTIDE: B Natriuretic Peptide: 255 pg/mL — ABNORMAL HIGH (ref 0.0–100.0)

## 2019-07-03 MED ORDER — MAGNESIUM SULFATE 2 GM/50ML IV SOLN
2.0000 g | Freq: Once | INTRAVENOUS | Status: AC
  Administered 2019-07-04: 01:00:00 2 g via INTRAVENOUS
  Filled 2019-07-03: qty 50

## 2019-07-03 MED ORDER — POTASSIUM CHLORIDE CRYS ER 20 MEQ PO TBCR
40.0000 meq | EXTENDED_RELEASE_TABLET | Freq: Once | ORAL | Status: AC
  Administered 2019-07-04: 01:00:00 40 meq via ORAL
  Filled 2019-07-03: qty 2

## 2019-07-03 MED ORDER — METOPROLOL TARTRATE 5 MG/5ML IV SOLN
5.0000 mg | Freq: Once | INTRAVENOUS | Status: AC
  Administered 2019-07-03: 21:00:00 5 mg via INTRAVENOUS
  Filled 2019-07-03: qty 5

## 2019-07-03 MED ORDER — METOPROLOL TARTRATE 5 MG/5ML IV SOLN
5.0000 mg | Freq: Once | INTRAVENOUS | Status: AC
  Administered 2019-07-03: 20:00:00 5 mg via INTRAVENOUS
  Filled 2019-07-03: qty 5

## 2019-07-03 MED ORDER — FUROSEMIDE 10 MG/ML IJ SOLN
40.0000 mg | Freq: Once | INTRAMUSCULAR | Status: AC
  Administered 2019-07-03: 20:00:00 40 mg via INTRAVENOUS
  Filled 2019-07-03: qty 4

## 2019-07-03 MED ORDER — DILTIAZEM HCL-DEXTROSE 125-5 MG/125ML-% IV SOLN (PREMIX)
5.0000 mg/h | INTRAVENOUS | Status: DC
  Administered 2019-07-03: 23:00:00 5 mg/h via INTRAVENOUS
  Filled 2019-07-03: qty 125

## 2019-07-03 NOTE — Telephone Encounter (Signed)
Pt called and reports chest pain and SOB. Pain is under left breast and doesn't move. Onset yesterday. Pt c/o that it feels as if " my whole left breast is on fire". Pt instructed to take a NTG and be seen in the ER. Pt agreed and stated she will drive herself to the ER. Nurse advised against that. Pt stated she lives only 5 mins from ER and feels safe enough drive to the ER. Please advise.

## 2019-07-03 NOTE — Telephone Encounter (Signed)
Agree that she should be seen in the ER with persistent CP. If she doesn't want to go by EMS, she should at least have someone take her. Time is of the essence.

## 2019-07-03 NOTE — H&P (Signed)
History and Physical    [name] KPT:[id number] DOB: 04/10/1941 DOA: 07/03/2019  PCP: [name], MD   Patient coming from: Home.  I have personally briefly reviewed patient's old medical records in [facility]  Chief Complaint: Chest pain.  HPI: [name] is a 79 y.o. female with medical history significant of anemia, cardiomyopathy, CHF, CAD, DJD, essential hypertension, GERD, hyperlipidemia, hypothyroidism, nephrolithiasis, obesity, left bundle branch block, paroxysmal atrial flutter documented in January 2018 who is coming to the emergency department due to precordial chest pain and shortness of breath since yesterday.  She took sublingual nitroglycerin at home with no relief.  However, she mentions that she has some episodes of less intense chest pain since early last week.  She complains of being tired and fatigued.  She has had PND while sleeping, has been using the recliner during the daytime and at least 2 pillows to sleep at bedtime.  The pain this evening was pressure-like, nonradiating, associated with palpitations and dizziness.  She has had some increased lower extremities edema over the last 2 weeks and have been using her furosemide, but states that she has some baseline LLE since surgery on.extremity.  She denies abdominal pain, nausea, emesis, melena or hematochezia.  No dysuria, frequency or hematuria.  No polyuria, polydipsia, polyphagia or blurred vision.  ED Course: Her initial vital signs were temperature 97.6 F, pulse 121, blood pressure 145 or 106 mmHg, respirations 20 and O2 sat 99% on room air.  The patient was started on a titrated Cardizem infusion after not responding initially to metoprolol 5 mg IVP x2.  She was also given 40 mg of furosemide.  I added magnesium sulfate 2 g IVPB along with K-Lor 40 mEq p.o. x1.  CBC and D-dimer were normal.  EKG with A. fib with RVR 123 bpm, troponin x2.  BNP was 255.0 pg/mL.  Magnesium 1.8, calcium 8.8 and  phosphorus 3.0 mg/dL.  The rest of the electrolytes within normal range.  Renal function was normal.  SARS coronavirus 2 antigen was negative.    Review of Systems: As per HPI otherwise 10 point review of systems negative.   Past Medical History:  Diagnosis Date  . Anemia   . Cardiomyopathy (HCC)    LVEF 40-45% 2011  . CHF (congestive heart failure) (HCC)   . Chronic insomnia   . Coronary atherosclerosis of native coronary artery    60% LAD in 2002  . DJD (degenerative joint disease)    Left THA  . Essential hypertension   . GERD (gastroesophageal reflux disease)   . Hyperlipidemia   . Hypothyroidism   . Left bundle branch block   . Nephrolithiasis   . Obesity   . Paroxysmal atrial flutter [facility])    Documented January 2018    Past Surgical History:  Procedure Laterality Date  . ABDOMINAL HYSTERECTOMY     (partial)  . APPENDECTOMY    . CHOLECYSTECTOMY    . COLONOSCOPY  Aug 2013   Dr. Loreta Ave, [facility]: small internal hemorrhoids and few scattered sigmoid diverticula, otherwise normal up to TI, TI appeared normal  . ESOPHAGOGASTRODUODENOSCOPY N/A 06/01/2012   RMR:5 cm hiatal hernia. Essentially normal esophagus  . HEMORROIDECTOMY    . JOINT REPLACEMENT    . NOSE SURGERY    . REVISION TOTAL HIP ARTHROPLASTY  2009   Left THA 2009     reports that she has never smoked. She has never used smokeless tobacco. She reports that she does  not drink alcohol or use drugs.  Allergies  Allergen Reactions  . Codeine Itching    "feel like going buggy"  Itchy skin  . Tape Itching    Nylon tape makes pt feel itchy    Family History  Problem Relation Age of Onset  . Heart attack Father   . Heart attack Mother   . Diabetes Brother   . Colon cancer Neg Hx    Prior to Admission medications   Medication Sig Start Date End Date Taking? Authorizing Provider  Calcium Carbonate-Vitamin D (CALCIUM 600 + D PO) Take 1 tablet by mouth daily.    Yes [provider]    ELIQUIS 5 MG TABS tablet Take 1 tablet by mouth twice daily 03/13/19  Yes Jonelle Sidle, MD  furosemide (LASIX) 40 MG tablet Take 20 mg by mouth daily as needed for fluid or edema.    Yes [provider]  gabapentin (NEURONTIN) 100 MG capsule Take 100 mg by mouth at bedtime.   Yes [provider]  isosorbide mononitrate (IMDUR) 30 MG 24 hr tablet Take 1 tablet by mouth every day 02/01/19  Yes Jonelle Sidle, MD  lisinopril (PRINIVIL,ZESTRIL) 20 MG tablet Take 20 mg by mouth 2 (two) times daily.    Yes [provider]  metoprolol succinate (TOPROL-XL) 25 MG 24 hr tablet Take 0.5 tablets by mouth every evening Patient taking differently: Take 12.5 mg by mouth daily.  03/13/19  Yes Jonelle Sidle, MD  Multiple Vitamin (MULTIVITAMIN WITH MINERALS) TABS Take 1 tablet by mouth daily.   Yes [provider]  nitroGLYCERIN (NITROSTAT) 0.4 MG SL tablet Place 1 tablet (0.4 mg total) under the tongue every 5 (five) minutes as needed. For chest pain 08/06/16  Yes Jonelle Sidle, MD  omeprazole (PRILOSEC) 20 MG capsule Take 20 mg by mouth every morning.    Yes [provider]  pravastatin (PRAVACHOL) 40 MG tablet Take 40 mg by mouth at bedtime.    Yes [provider]  tamsulosin (FLOMAX) 0.4 MG CAPS capsule Take 1 capsule (0.4 mg total) by mouth daily. 02/25/16  Yes Beaulah Dinning, MD    Physical Exam: Vitals:   07/03/19 2230 07/03/19 2245 07/03/19 2300 07/03/19 2315  BP: (!) 141/90  135/78   Pulse: 97 87 99 94  Resp:      Temp:      TempSrc:      SpO2: 97% 97% 99% 98%  Weight:      Height:        Constitutional: NAD, calm, comfortable Eyes: PERRL, lids and conjunctivae normal ENMT: Mucous membranes are moist. Posterior pharynx clear of any exudate or lesions. Neck: normal, supple, no masses, no thyromegaly Respiratory: Bibasilar Rales with mild bilateral wheezing, no crackles. Normal respiratory effort. No accessory  muscle use.  Cardiovascular: Irregularly irregular, no murmurs / rubs / gallops.  1+ lower extremity pitting edema.  Bilateral stage II lymphedema.  2+ pedal pulses. No carotid bruits.  Abdomen: Obese, nondistended.  BS positive.  Soft, no tenderness, no masses palpated. No hepatosplenomegaly. Musculoskeletal: Generalized weakness.  No clubbing / cyanosis. Good ROM, no contractures. Normal muscle tone.  Skin: no rashes, lesions, ulcers on very limited dermatological examination Neurologic: CN 2-12 grossly intact. Sensation intact, DTR normal.  Nonfocal generalized weakness.Marland Kitchen  Psychiatric: Normal judgment and insight. Alert and oriented x 3. Normal mood.   Labs on Admission: I have personally reviewed following labs and imaging studies  CBC: Recent Labs  Lab 07/03/19 1719  WBC 6.8  HGB 12.1  HCT 38.8  MCV 96.3  PLT 683   Basic Metabolic Panel: Recent Labs  Lab 07/03/19 1719  NA 141  K 3.7  CL 110  CO2 23  GLUCOSE 121*  BUN 16  CREATININE 1.00  CALCIUM 8.8*   GFR: Estimated Creatinine Clearance: 53.9 mL/min (by C-G formula based on SCr of 1 mg/dL). Liver Function Tests: No results for input(s): AST, ALT, ALKPHOS, BILITOT, PROT, ALBUMIN in the last 168 hours. No results for input(s): LIPASE, AMYLASE in the last 168 hours. No results for input(s): AMMONIA in the last 168 hours. Coagulation Profile: No results for input(s): INR, PROTIME in the last 168 hours. Cardiac Enzymes: No results for input(s): CKTOTAL, CKMB, CKMBINDEX, TROPONINI in the last 168 hours. BNP (last 3 results) No results for input(s): PROBNP in the last 8760 hours. HbA1C: No results for input(s): HGBA1C in the last 72 hours. CBG: No results for input(s): GLUCAP in the last 168 hours. Lipid Profile: No results for input(s): CHOL, HDL, LDLCALC, TRIG, CHOLHDL, LDLDIRECT in the last 72 hours. Thyroid Function Tests: No results for input(s): TSH, T4TOTAL, FREET4, T3FREE, THYROIDAB in the last 72  hours. Anemia Panel: No results for input(s): VITAMINB12, FOLATE, FERRITIN, TIBC, IRON, RETICCTPCT in the last 72 hours. Urine analysis:  Radiological Exams on Admission: No results found.   04/21/2016 echocardiogram complete Indications:   Chest pain 786.51.   -------------------------------------------------------------------  History:  PMH: PSVT, Coronary Atherosclerosis. Risk factors:  Hypertension. Dyslipidemia.   -------------------------------------------------------------------  Study Conclusions   - Left ventricle: The cavity size was normal. Systolic function was  difficult to assess due to tachycardia, but appeared grossly  normal. The estimated ejection fraction was in the range of 50%  to 55%. Features are consistent with a pseudonormal left  ventricular filling pattern, with concomitant abnormal relaxation  and increased filling pressure (grade 2 diastolic dysfunction).  Doppler parameters are consistent with high ventricular filling  pressure. Mild to moderate concentric LVH.  - Ventricular septum: Septal motion showed abnormal function and  dyssynergy. These changes are consistent with intraventricular  conduction delay.  - Mitral valve: Moderately calcified annulus. Normal thickness  leaflets . There was moderate regurgitation.  - Left atrium: The atrium was moderately to severely dilated.  - Right atrium: The atrium was mildly dilated.  - Atrial septum: A patent foramen ovale cannot be excluded.  - Tricuspid valve: There was mild regurgitation.  - Pulmonary arteries: PA peak pressure: 32 mm Hg (S).   EKG: Independently reviewed.  Vent. rate 123 BPM PR interval * ms QRS duration 132 ms QT/QTc 346/495 ms P-R-T axes * -54 101 Atrial fibrillation with rapid ventricular response Left axis deviation Left bundle branch block Abnormal ECG  Assessment/Plan Principal Problem:   Atrial fibrillation with RVR  (HCC) Observation/stepdown. Continue supplemental oxygen. Continue titrated Cardizem infusion. Optimize electrolytes. Continue Eliquis 5 mg p.o. twice daily. Obtain echocardiogram. Consult cardiology.  Active Problems:   Hypothyroidism Not on levothyroxine. Check TSH.    Hyperlipidemia Continue pravastatin 20 mg p.o. daily.    Essential hypertension, benign Continue lisinopril 20 mg p.o. twice daily.  Will switch to IV furosemide 40 mg daily. Continue metoprolol 12.5 mg p.o. daily.    Coronary atherosclerosis of native coronary artery On Eliquis, low-dose metoprolol, ACE inhibitor and pravastatin.    GERD (gastroesophageal reflux disease) Continue PPI.   DVT prophylaxis: On Eliquis. Code Status: Full code. Family Communication: Disposition Plan: Observation for rate control, echo  and cardiology evaluation. Consults called: Routine cardiology consult Admission status: Observation/stepdown.   Bobette Mo MD [facility]  If 7PM-7AM, please contact night-coverage [email or web address]  07/03/2019, 11:52 PM   This document was prepared using Dragon voice recognition software and may contain some unintended transcription errors.

## 2019-07-03 NOTE — ED Triage Notes (Signed)
Pt to er, pt states that she is here for chest pain, states that it started as sob a few days ago, states that yesterday it was bad and then today is worse.  Pt c/o L sided chest pain around her breast.  States that the pain doesn't radiate anywhere.  States that coughing and movement makes her pain worse.  Pt talking in complete sentences.

## 2019-07-03 NOTE — ED Provider Notes (Signed)
[facility] EMERGENCY DEPARTMENT Provider Note   CSN: [id number] Arrival date & time: 07/03/19  1652     History Chief Complaint  Patient presents with  . Chest Pain    [name] is a 79 y.o. female with history of CHF, CAD, hypertension, hyperlipidemia, hypothyroidism, obesity, paroxysmal atrial flutter currently on Eliquis presents for evaluation of acute onset, intermittent left-sided chest pains for 2 days.  Reports that symptoms began yesterday with intermittent pain overlying her left breast.  Today began to radiate to the left upper extremity.  She has a difficult time describing the pain.  She does feel short of breath constantly but states this worsens with activity and ambulation.  She has a chronic cough which she reports is unchanged.  Denies fevers, abdominal pain, nausea, vomiting, lightheadedness, or palpitations.  She has noted progressively worsening bilateral lower extremity edema for the last 2 weeks but notes that she has chronic left lower extremity edema after a hip replacement years ago.  She takes Lasix for CHF but states that she has not been as compliant with this medication as it does not come prepackaged with the rest of her medications which she receives through mail order pharmacy.  States that she last had her Lasix a few days ago.  Today she called her cardiologist who recommended presentation to the ED for further evaluation.  She took 1 sublingual nitroglycerin with no relief in her symptoms.  She is a former smoker, quit several decades ago.  She denies any recent travel or surgeries, hemoptysis, prior history of DVT or PE, and she is not on hormone replacement therapy.   The history is provided by the patient.       Past Medical History:  Diagnosis Date  . Anemia   . Cardiomyopathy (HCC)    LVEF 40-45% 2011  . CHF (congestive heart failure) (HCC)   . Chronic insomnia   . Coronary atherosclerosis of native coronary artery    60% LAD in 2002  .  DJD (degenerative joint disease)    Left THA  . Essential hypertension   . GERD (gastroesophageal reflux disease)   . Hyperlipidemia   . Hypothyroidism   . Left bundle branch block   . Nephrolithiasis   . Obesity   . Paroxysmal atrial flutter [facility])    Documented January 2018    Patient Active Problem List   Diagnosis Date Noted  . Atrial fibrillation with RVR (HCC) 07/03/2019  . GERD (gastroesophageal reflux disease)   . PSVT (paroxysmal supraventricular tachycardia) (HCC) 04/20/2016  . URI (upper respiratory infection) 04/20/2016  . 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 Diverticulitis 07/06/2013  . Rectal bleeding 12/29/2012  . Coronary atherosclerosis of native coronary artery   . DJD (degenerative joint disease)   . Atypical chest pain 04/22/2009  . Hypothyroidism 12/05/2008  . Hyperlipidemia 12/05/2008  . Essential hypertension, benign 12/05/2008    Past Surgical History:  Procedure Laterality Date  . ABDOMINAL HYSTERECTOMY     (partial)  . APPENDECTOMY    . CHOLECYSTECTOMY    . COLONOSCOPY  Aug 2013   Dr. Loreta Ave, [facility]: small internal hemorrhoids and few scattered sigmoid diverticula, otherwise normal up to TI, TI appeared normal  . ESOPHAGOGASTRODUODENOSCOPY N/A 06/01/2012   RMR:5 cm hiatal hernia. Essentially normal esophagus  . HEMORROIDECTOMY    . JOINT REPLACEMENT    . NOSE SURGERY    . REVISION TOTAL HIP ARTHROPLASTY  2009   Left THA 2009  OB History   No obstetric history on file.     Family History  Problem Relation Age of Onset  . Heart attack Father   . Heart attack Mother   . Diabetes Brother   . Colon cancer Neg Hx     Social History   Tobacco Use  . Smoking status: Never Smoker  . Smokeless tobacco: Never Used  Substance Use Topics  . Alcohol use: No    Alcohol/week: 0.0 standard drinks  . Drug use: No    Home Medications Prior to Admission medications   Medication Sig Start Date End Date Taking?  Authorizing Provider  Calcium Carbonate-Vitamin D (CALCIUM 600 + D PO) Take 1 tablet by mouth daily.    Yes [provider]  ELIQUIS 5 MG TABS tablet Take 1 tablet by mouth twice daily 03/13/19  Yes Jonelle Sidle, MD  furosemide (LASIX) 40 MG tablet Take 20 mg by mouth daily as needed for fluid or edema.    Yes [provider]  gabapentin (NEURONTIN) 100 MG capsule Take 100 mg by mouth at bedtime.   Yes [provider]  isosorbide mononitrate (IMDUR) 30 MG 24 hr tablet Take 1 tablet by mouth every day 02/01/19  Yes Jonelle Sidle, MD  lisinopril (PRINIVIL,ZESTRIL) 20 MG tablet Take 20 mg by mouth 2 (two) times daily.    Yes [provider]  metoprolol succinate (TOPROL-XL) 25 MG 24 hr tablet Take 0.5 tablets by mouth every evening Patient taking differently: Take 12.5 mg by mouth daily.  03/13/19  Yes Jonelle Sidle, MD  Multiple Vitamin (MULTIVITAMIN WITH MINERALS) TABS Take 1 tablet by mouth daily.   Yes [provider]  nitroGLYCERIN (NITROSTAT) 0.4 MG SL tablet Place 1 tablet (0.4 mg total) under the tongue every 5 (five) minutes as needed. For chest pain 08/06/16  Yes Jonelle Sidle, MD  omeprazole (PRILOSEC) 20 MG capsule Take 20 mg by mouth every morning.    Yes [provider]  pravastatin (PRAVACHOL) 40 MG tablet Take 40 mg by mouth at bedtime.    Yes [provider]  tamsulosin (FLOMAX) 0.4 MG CAPS capsule Take 1 capsule (0.4 mg total) by mouth daily. 02/25/16  Yes Beaulah Dinning, MD    Allergies    Codeine and Tape  Review of Systems   Review of Systems  Constitutional: Negative for chills and fever.  Respiratory: Positive for cough and shortness of breath.   Cardiovascular: Positive for chest pain and leg swelling. Negative for palpitations.  Gastrointestinal: Negative for abdominal pain, nausea and vomiting.  Neurological: Negative for weakness, light-headedness and numbness.  All other systems  reviewed and are negative.   Physical Exam Updated Vital Signs BP 135/78   Pulse 94   Temp 98.1 F (36.7 C) (Oral)   Resp 18   Ht 5\' 3"  (1.6 m)   Wt 99.8 kg   SpO2 98%   BMI 38.97 kg/m   Physical Exam Vitals and nursing note reviewed.  Constitutional:      General: She is not in acute distress.    Appearance: She is well-developed.  HENT:     Head: Normocephalic and atraumatic.  Eyes:     General:        Right eye: No discharge.        Left eye: No discharge.     Conjunctiva/sclera: Conjunctivae normal.  Neck:     Vascular: No JVD.     Trachea: No tracheal deviation.  Cardiovascular:     Rate and Rhythm: Tachycardia present. Rhythm irregular.     Pulses:          Radial pulses are 2+ on the right side and 2+ on the left side.       Dorsalis pedis pulses are 2+ on the right side and 2+ on the left side.       Posterior tibial pulses are 2+ on the right side and 2+ on the left side.     Comments: 2+ pitting edema of the bilateral lower extremities, left worse than right.  Patient reports chronic left lower extremity edema. Pulmonary:     Effort: Pulmonary effort is normal.     Breath sounds: Examination of the left-middle field reveals rales. Examination of the right-lower field reveals rales. Examination of the left-lower field reveals rales. Rales present.  Abdominal:     General: There is no distension.     Palpations: Abdomen is soft.     Tenderness: There is no abdominal tenderness. There is no guarding or rebound.  Musculoskeletal:     Cervical back: Neck supple.     Right lower leg: No tenderness. Edema present.     Left lower leg: No tenderness. Edema present.  Skin:    General: Skin is warm and dry.     Findings: No erythema.  Neurological:     Mental Status: She is alert.  Psychiatric:        Behavior: Behavior normal.     ED Results / Procedures / Treatments   Labs (all labs ordered are listed, but only abnormal results are displayed) Labs  Reviewed  BASIC METABOLIC PANEL - Abnormal; Notable for the following components:      Result Value   Glucose, Bld 121 (*)    Calcium 8.8 (*)    GFR calc non Af Amer 55 (*)    All other components within normal limits  BRAIN NATRIURETIC PEPTIDE - Abnormal; Notable for the following components:   B Natriuretic Peptide 255.0 (*)    All other components within normal limits  CBC  D-DIMER, QUANTITATIVE (NOT AT [facility])  MAGNESIUM  PHOSPHORUS  BASIC METABOLIC PANEL  POC SARS CORONAVIRUS 2 AG -  ED  TROPONIN I (HIGH SENSITIVITY)  TROPONIN I (HIGH SENSITIVITY)    EKG EKG Interpretation  Date/Time:  Monday July 03 2019 17:02:51 EDT Ventricular Rate:  123 PR Interval:    QRS Duration: 132 QT Interval:  346 QTC Calculation: 495 R Axis:   -54 Text Interpretation: Atrial fibrillation with rapid ventricular response Left axis deviation Left bundle branch block Abnormal ECG No significant change since 04/21/2016 Confirmed by Veryl Speak [phone]) on 07/03/2019 6:20:04 PM   Radiology DG Chest 2 View  Result Date: 07/03/2019 CLINICAL DATA:  79 year old female with chest pain. EXAM: CHEST - 2 VIEW COMPARISON:  Chest radiograph dated 04/20/2016. FINDINGS: There is cardiomegaly with mild vascular congestion and probable mild edema. No focal consolidation, pleural effusion or pneumothorax. There is atherosclerotic calcification of the aorta. No acute osseous pathology. IMPRESSION: Cardiomegaly with findings of CHF. Electronically Signed   By: Anner Crete M.D.   On: 07/03/2019 18:46    Procedures .Critical Care Performed by: Renita Papa, PA-C Authorized by: Renita Papa, PA-C   Critical care provider statement:    Critical care time (minutes):  40   Critical care was necessary to treat or prevent imminent or life-threatening deterioration of the following conditions:  Circulatory failure   Critical care  was time spent personally by me on the following activities:  Discussions with  consultants, evaluation of patient's response to treatment, examination of patient, ordering and performing treatments and interventions, ordering and review of laboratory studies, ordering and review of radiographic studies, pulse oximetry, re-evaluation of patient's condition, obtaining history from patient or surrogate and review of old charts   (including critical care time)  Medications Ordered in ED Medications  diltiazem (CARDIZEM) 125 mg in dextrose 5% 125 mL (1 mg/mL) infusion (5 mg/hr Intravenous New Bag/Given 07/03/19 2252)  magnesium sulfate IVPB 2 g 50 mL (has no administration in time range)  potassium chloride SA (KLOR-CON) CR tablet 40 mEq (has no administration in time range)  furosemide (LASIX) injection 40 mg (40 mg Intravenous Given 07/03/19 1934)  metoprolol tartrate (LOPRESSOR) injection 5 mg (5 mg Intravenous Given 07/03/19 1930)  metoprolol tartrate (LOPRESSOR) injection 5 mg (5 mg Intravenous Given 07/03/19 2054)    ED Course  I have reviewed the triage vital signs and the nursing notes.  Pertinent labs & imaging results that were available during my care of the patient were reviewed by me and considered in my medical decision making (see chart for details).    MDM Rules/Calculators/A&P                      Patient presenting for evaluation of left-sided chest pains with shortness of breath for 2 days.  Patient is afebrile, found to be in A. fib with RVR in the ED.  Was given 2 doses of IV metoprolol with little improvement in heart rate and was started on a Cardizem drip.  Work-up reviewed and interpreted by myself shows no leukocytosis, no anemia, no metabolic derangements, no renal insufficiency.  BNP is elevated and chest x-ray shows cardiomegaly with findings of CHF.  Patient was given a dose of IV Lasix in the ED.  D-dimer is negative which is reassuring and I have a low suspicion of PE at this time.  She will require admission for management of A. fib with RVR and  CHF exacerbation.  Spoke with Dr. Robb Matar with [facility] hospitalist service who agrees to assume care of patient and bring her into the hospital for further evaluation and management.  Patient was seen and evaluated by Dr. Judd Lien who agrees with assessment and plan at this time   Final Clinical Impression(s) / ED Diagnoses Final diagnoses:  Atrial fibrillation with RVR (HCC)  Acute on chronic congestive heart failure, unspecified heart failure type [facility])    Rx / DC Orders ED Discharge Orders    None       Bennye Alm 07/04/19 Mosie Lukes, MD 07/04/19 1501

## 2019-07-04 ENCOUNTER — Encounter (HOSPITAL_COMMUNITY): Payer: Self-pay | Admitting: Internal Medicine

## 2019-07-04 ENCOUNTER — Observation Stay (HOSPITAL_COMMUNITY): Payer: Medicare Other

## 2019-07-04 DIAGNOSIS — I5033 Acute on chronic diastolic (congestive) heart failure: Secondary | ICD-10-CM

## 2019-07-04 DIAGNOSIS — E785 Hyperlipidemia, unspecified: Secondary | ICD-10-CM | POA: Diagnosis present

## 2019-07-04 DIAGNOSIS — Z8249 Family history of ischemic heart disease and other diseases of the circulatory system: Secondary | ICD-10-CM | POA: Diagnosis not present

## 2019-07-04 DIAGNOSIS — E7849 Other hyperlipidemia: Secondary | ICD-10-CM

## 2019-07-04 DIAGNOSIS — I480 Paroxysmal atrial fibrillation: Secondary | ICD-10-CM

## 2019-07-04 DIAGNOSIS — E039 Hypothyroidism, unspecified: Secondary | ICD-10-CM | POA: Diagnosis present

## 2019-07-04 DIAGNOSIS — Z7901 Long term (current) use of anticoagulants: Secondary | ICD-10-CM | POA: Diagnosis not present

## 2019-07-04 DIAGNOSIS — I2510 Atherosclerotic heart disease of native coronary artery without angina pectoris: Secondary | ICD-10-CM | POA: Diagnosis not present

## 2019-07-04 DIAGNOSIS — Z20822 Contact with and (suspected) exposure to covid-19: Secondary | ICD-10-CM | POA: Diagnosis present

## 2019-07-04 DIAGNOSIS — I340 Nonrheumatic mitral (valve) insufficiency: Secondary | ICD-10-CM

## 2019-07-04 DIAGNOSIS — M1990 Unspecified osteoarthritis, unspecified site: Secondary | ICD-10-CM | POA: Diagnosis present

## 2019-07-04 DIAGNOSIS — K219 Gastro-esophageal reflux disease without esophagitis: Secondary | ICD-10-CM | POA: Diagnosis not present

## 2019-07-04 DIAGNOSIS — Z885 Allergy status to narcotic agent status: Secondary | ICD-10-CM | POA: Diagnosis not present

## 2019-07-04 DIAGNOSIS — I200 Unstable angina: Secondary | ICD-10-CM

## 2019-07-04 DIAGNOSIS — I10 Essential (primary) hypertension: Secondary | ICD-10-CM

## 2019-07-04 DIAGNOSIS — I110 Hypertensive heart disease with heart failure: Secondary | ICD-10-CM | POA: Diagnosis present

## 2019-07-04 DIAGNOSIS — Z7902 Long term (current) use of antithrombotics/antiplatelets: Secondary | ICD-10-CM | POA: Diagnosis not present

## 2019-07-04 DIAGNOSIS — I447 Left bundle-branch block, unspecified: Secondary | ICD-10-CM | POA: Diagnosis present

## 2019-07-04 DIAGNOSIS — R079 Chest pain, unspecified: Secondary | ICD-10-CM

## 2019-07-04 DIAGNOSIS — E038 Other specified hypothyroidism: Secondary | ICD-10-CM

## 2019-07-04 DIAGNOSIS — Z833 Family history of diabetes mellitus: Secondary | ICD-10-CM | POA: Diagnosis not present

## 2019-07-04 DIAGNOSIS — I4892 Unspecified atrial flutter: Secondary | ICD-10-CM | POA: Diagnosis present

## 2019-07-04 DIAGNOSIS — I25119 Atherosclerotic heart disease of native coronary artery with unspecified angina pectoris: Secondary | ICD-10-CM | POA: Diagnosis present

## 2019-07-04 DIAGNOSIS — I361 Nonrheumatic tricuspid (valve) insufficiency: Secondary | ICD-10-CM

## 2019-07-04 DIAGNOSIS — I081 Rheumatic disorders of both mitral and tricuspid valves: Secondary | ICD-10-CM | POA: Diagnosis present

## 2019-07-04 DIAGNOSIS — F5104 Psychophysiologic insomnia: Secondary | ICD-10-CM | POA: Diagnosis present

## 2019-07-04 DIAGNOSIS — Z96642 Presence of left artificial hip joint: Secondary | ICD-10-CM | POA: Diagnosis present

## 2019-07-04 DIAGNOSIS — Z91048 Other nonmedicinal substance allergy status: Secondary | ICD-10-CM | POA: Diagnosis not present

## 2019-07-04 DIAGNOSIS — Z6838 Body mass index (BMI) 38.0-38.9, adult: Secondary | ICD-10-CM | POA: Diagnosis not present

## 2019-07-04 DIAGNOSIS — I4821 Permanent atrial fibrillation: Secondary | ICD-10-CM | POA: Diagnosis present

## 2019-07-04 DIAGNOSIS — I4891 Unspecified atrial fibrillation: Secondary | ICD-10-CM | POA: Diagnosis not present

## 2019-07-04 LAB — BASIC METABOLIC PANEL
Anion gap: 8 (ref 5–15)
BUN: 14 mg/dL (ref 8–23)
CO2: 26 mmol/L (ref 22–32)
Calcium: 9.1 mg/dL (ref 8.9–10.3)
Chloride: 108 mmol/L (ref 98–111)
Creatinine, Ser: 0.91 mg/dL (ref 0.44–1.00)
GFR calc Af Amer: >60 mL/min (ref >60)
GFR calc non Af Amer: >60 mL/min (ref >60)
Glucose, Bld: 102 mg/dL — ABNORMAL HIGH (ref 70–99)
Potassium: 3.7 mmol/L (ref 3.5–5.1)
Sodium: 142 mmol/L (ref 135–145)

## 2019-07-04 LAB — ECHOCARDIOGRAM COMPLETE
Height: 63 in
Weight: 3460.34 oz

## 2019-07-04 LAB — MRSA PCR SCREENING: MRSA by PCR: NEGATIVE

## 2019-07-04 LAB — RESPIRATORY PANEL BY RT PCR (FLU A&B, COVID)
Influenza A by PCR: NEGATIVE
Influenza B by PCR: NEGATIVE
SARS Coronavirus 2 by RT PCR: NEGATIVE

## 2019-07-04 LAB — PHOSPHORUS: Phosphorus: 3 mg/dL (ref 2.5–4.6)

## 2019-07-04 LAB — MAGNESIUM: Magnesium: 1.8 mg/dL (ref 1.7–2.4)

## 2019-07-04 LAB — APTT
aPTT: 35 seconds (ref 24–36)
aPTT: 81 seconds — ABNORMAL HIGH (ref 24–36)

## 2019-07-04 LAB — HEPARIN LEVEL (UNFRACTIONATED): Heparin Unfractionated: 1.86 IU/mL — ABNORMAL HIGH (ref 0.30–0.70)

## 2019-07-04 LAB — PROTIME-INR
INR: 1.2 (ref 0.8–1.2)
Prothrombin Time: 14.9 seconds (ref 11.4–15.2)

## 2019-07-04 LAB — TSH: TSH: 3.019 u[IU]/mL (ref 0.350–4.500)

## 2019-07-04 MED ORDER — PANTOPRAZOLE SODIUM 40 MG PO TBEC
40.0000 mg | DELAYED_RELEASE_TABLET | Freq: Every day | ORAL | Status: DC
  Administered 2019-07-04 – 2019-07-06 (×3): 40 mg via ORAL
  Filled 2019-07-04 (×3): qty 1

## 2019-07-04 MED ORDER — HEPARIN (PORCINE) 25000 UT/250ML-% IV SOLN
1100.0000 [IU]/h | INTRAVENOUS | Status: DC
  Administered 2019-07-04: 1100 [IU]/h via INTRAVENOUS
  Filled 2019-07-04: qty 250

## 2019-07-04 MED ORDER — GABAPENTIN 100 MG PO CAPS
100.0000 mg | ORAL_CAPSULE | Freq: Every day | ORAL | Status: DC
  Administered 2019-07-04 – 2019-07-05 (×2): 100 mg via ORAL
  Filled 2019-07-04 (×2): qty 1

## 2019-07-04 MED ORDER — LISINOPRIL 10 MG PO TABS
20.0000 mg | ORAL_TABLET | Freq: Two times a day (BID) | ORAL | Status: DC
  Administered 2019-07-04 – 2019-07-06 (×5): 20 mg via ORAL
  Filled 2019-07-04 (×5): qty 2

## 2019-07-04 MED ORDER — TAMSULOSIN HCL 0.4 MG PO CAPS
0.4000 mg | ORAL_CAPSULE | Freq: Every day | ORAL | Status: DC
  Administered 2019-07-04 – 2019-07-06 (×3): 0.4 mg via ORAL
  Filled 2019-07-04 (×3): qty 1

## 2019-07-04 MED ORDER — ISOSORBIDE MONONITRATE ER 30 MG PO TB24
30.0000 mg | ORAL_TABLET | Freq: Every day | ORAL | Status: DC
  Administered 2019-07-04 – 2019-07-06 (×3): 30 mg via ORAL
  Filled 2019-07-04 (×3): qty 1

## 2019-07-04 MED ORDER — APIXABAN 5 MG PO TABS
5.0000 mg | ORAL_TABLET | Freq: Two times a day (BID) | ORAL | Status: DC
  Filled 2019-07-04: qty 1

## 2019-07-04 MED ORDER — CHLORHEXIDINE GLUCONATE CLOTH 2 % EX PADS
6.0000 | MEDICATED_PAD | Freq: Every day | CUTANEOUS | Status: DC
  Administered 2019-07-04 – 2019-07-06 (×3): 6 via TOPICAL

## 2019-07-04 MED ORDER — METOPROLOL SUCCINATE ER 25 MG PO TB24
12.5000 mg | ORAL_TABLET | Freq: Once | ORAL | Status: AC
  Administered 2019-07-04: 12.5 mg via ORAL
  Filled 2019-07-04: qty 1

## 2019-07-04 MED ORDER — METOPROLOL SUCCINATE ER 25 MG PO TB24: 25.0000 mg | ORAL_TABLET | Freq: Every day | ORAL | Status: DC

## 2019-07-04 MED ORDER — PRAVASTATIN SODIUM 40 MG PO TABS
40.0000 mg | ORAL_TABLET | Freq: Every day | ORAL | Status: DC
  Administered 2019-07-04 – 2019-07-05 (×2): 40 mg via ORAL
  Filled 2019-07-04 (×2): qty 1

## 2019-07-04 MED ORDER — FUROSEMIDE 10 MG/ML IJ SOLN
40.0000 mg | Freq: Once | INTRAMUSCULAR | Status: AC
  Administered 2019-07-04: 11:00:00 40 mg via INTRAVENOUS
  Filled 2019-07-04: qty 4

## 2019-07-04 MED ORDER — METOPROLOL SUCCINATE ER 25 MG PO TB24
12.5000 mg | ORAL_TABLET | Freq: Every day | ORAL | Status: DC
  Administered 2019-07-04: 10:00:00 12.5 mg via ORAL
  Filled 2019-07-04: qty 1

## 2019-07-04 MED ORDER — NITROGLYCERIN 0.4 MG SL SUBL: 0.4000 mg | SUBLINGUAL_TABLET | SUBLINGUAL | Status: DC | PRN

## 2019-07-04 NOTE — Progress Notes (Addendum)
ANTICOAGULATION CONSULT NOTE - Initial Consult  Pharmacy Consult for heparin IV Indication: atrial fibrillation  Allergies  Allergen Reactions  . Codeine Itching    "feel like going buggy"  Itchy skin  . Tape Itching    Nylon tape makes pt feel itchy    Patient Measurements: Height: 5\' 3"  (160 cm) Weight: 216 lb 4.3 oz (98.1 kg) IBW/kg (Calculated) : 52.4 Heparin Dosing Weight: 75 kg  Vital Signs: Temp: 98.1 F (36.7 C) (03/23 0800) Temp Source: Oral (03/23 0800) BP: 110/80 (03/23 1042) Pulse Rate: 92 (03/23 1042)  Labs: Recent Labs    07/03/19 1719 07/03/19 1921 07/04/19 0518  HGB 12.1  --   --   HCT 38.8  --   --   PLT 248  --   --   CREATININE 1.00  --  0.91  TROPONINIHS 4 5  --     Estimated Creatinine Clearance: 58.7 mL/min (by C-G formula based on SCr of 0.91 mg/dL).   Medical History: Past Medical History:  Diagnosis Date  . Anemia   . Cardiomyopathy (HCC)    LVEF 40-45% 2011  . CHF (congestive heart failure) (HCC)   . Chronic insomnia   . Coronary atherosclerosis of native coronary artery    60% LAD in 2002  . DJD (degenerative joint disease)    Left THA  . Essential hypertension   . GERD (gastroesophageal reflux disease)   . Hyperlipidemia   . Hypothyroidism   . Left bundle branch block   . Nephrolithiasis   . Obesity   . Paroxysmal atrial flutter [facility])    Documented January 2018    Medications:  Medications Prior to Admission  Medication Sig Dispense Refill Last Dose  . Calcium Carbonate-Vitamin D (CALCIUM 600 + D PO) Take 1 tablet by mouth daily.    07/03/2019 at Unknown time  . ELIQUIS 5 MG TABS tablet Take 1 tablet by mouth twice daily 60 tablet 11 07/03/2019 at 0830  . furosemide (LASIX) 40 MG tablet Take 20 mg by mouth daily as needed for fluid or edema.    07/03/2019 at Unknown time  . gabapentin (NEURONTIN) 100 MG capsule Take 100 mg by mouth at bedtime.   07/02/2019 at Unknown time  . isosorbide mononitrate (IMDUR) 30 MG 24 hr  tablet Take 1 tablet by mouth every day 30 tablet 11 07/03/2019 at Unknown time  . lisinopril (PRINIVIL,ZESTRIL) 20 MG tablet Take 20 mg by mouth 2 (two) times daily.    07/03/2019 at Unknown time  . metoprolol succinate (TOPROL-XL) 25 MG 24 hr tablet Take 0.5 tablets by mouth every evening (Patient taking differently: Take 12.5 mg by mouth daily. ) 15 tablet 11 07/02/2019 at 1900  . Multiple Vitamin (MULTIVITAMIN WITH MINERALS) TABS Take 1 tablet by mouth daily.   07/03/2019 at Unknown time  . nitroGLYCERIN (NITROSTAT) 0.4 MG SL tablet Place 1 tablet (0.4 mg total) under the tongue every 5 (five) minutes as needed. For chest pain 25 tablet 3 07/03/2019 at Unknown time  . omeprazole (PRILOSEC) 20 MG capsule Take 20 mg by mouth every morning.    07/03/2019 at Unknown time  . pravastatin (PRAVACHOL) 40 MG tablet Take 40 mg by mouth at bedtime.    07/02/2019 at Unknown time  . tamsulosin (FLOMAX) 0.4 MG CAPS capsule Take 1 capsule (0.4 mg total) by mouth daily. 30 capsule 0 07/03/2019 at Unknown time    Assessment: Pharmacy consulted to dose heparin in patient with atrial fibrillation.  Patient is  on Eliquis prior to admission with last dose given 3/22 0830 so will need to monitor based on aPTT until correlation with heparin levels.  Holding eliquis in case invasive procedures required during amdission.  Goal of Therapy:  Heparin level 0.3-0.7 units/ml aPTT 66-102 seconds Monitor platelets by anticoagulation protocol: Yes   Plan:  No bolus Start heparin infusion at 1100 units/hr Check anti-Xa level in 8 hours and daily while on heparin Continue to monitor H&H and platelets  Margot Ables, PharmD Clinical Pharmacist 07/04/2019 10:48 AM

## 2019-07-04 NOTE — Progress Notes (Signed)
PROGRESS NOTE    [name]  KWI:[id number] DOB: 27-Apr-1940 DOA: 07/03/2019 PCP: [name], MD     Brief Narrative:  As per H&P written by Dr. Robb Matar on 07/03/2019 79 y.o. female with medical history significant of anemia, cardiomyopathy, chronic diastolic CHF, nonobstructive CAD, DJD, essential hypertension, GERD, hyperlipidemia, hypothyroidism, nephrolithiasis, obesity, left bundle branch block, paroxysmal atrial flutter documented in January 2018 who is coming to the emergency department due to precordial chest pain and shortness of breath since yesterday.  She took sublingual nitroglycerin at home with no relief.  However, she mentions that she has some episodes of less intense chest pain since early last week.  She complains of being tired and fatigued.  She has had PND while sleeping, has been using the recliner during the daytime and at least 2 pillows to sleep at bedtime.  The pain this evening was pressure-like, nonradiating, associated with palpitations and dizziness.  She has had some increased lower extremities edema over the last 2 weeks and have been using her furosemide, but states that she has some baseline LLE since surgery on.extremity.  She denies abdominal pain, nausea, emesis, melena or hematochezia.  No dysuria, frequency or hematuria.  No polyuria, polydipsia, polyphagia or blurred vision.  ED Course: Her initial vital signs were temperature 97.6 F, pulse 121, blood pressure 145 or 106 mmHg, respirations 20 and O2 sat 99% on room air.  The patient was started on a titrated Cardizem infusion after not responding initially to metoprolol 5 mg IVP x2.  She was also given 40 mg of furosemide.  I added magnesium sulfate 2 g IVPB along with K-Lor 40 mEq p.o. x1.  CBC and D-dimer were normal.  EKG with A. fib with RVR 123 bpm, troponin x2.  BNP was 255.0 pg/mL.  Magnesium 1.8, calcium 8.8 and phosphorus 3.0 mg/dL.  The rest of the electrolytes within normal range.  Renal  function was normal.  SARS coronavirus 2 antigen was negative.    Assessment & Plan: 1-chest pain/shortness of breath -With concern for unstable angina and acute on chronic diastolic heart failure. -Continue to follow daily weights, low-sodium diet, and a strict I's and O's -IV Lasix x1 has been ordered as per cardiology recommendation -Patient has been started on heparin drip in anticipation for potential ischemic work-up -Elevated BNP appreciated on presentation. -Troponins are negative -After heart rate was controlled she is pain-free. -Heart score is 5-6 (Has previous history of nonobstructive coronary artery disease, hypertension, hyperlipidemia, A. Fib and obesity)  2-Atrial fibrillation with RVR (HCC) -Patient with prior history of paroxysmal atrial flutter -Responded well to IV metoprolol x2 and Cardizem drip -Rate currently controlled -Will switch her off Cardizem drip and use metoprolol -Eliquis has been placed on hold and patient started on heparin drip in case that ischemia work-up is required. -Follow cardiology service recommendations.  3-Hypothyroidism -Continue Synthroid -TSH within normal limits.  4-Hyperlipidemia -Continue statins  5-Essential hypertension, benign -Soft, but stable -Continue to follow vital signs while receiving IV Lasix and transitioning patient off Cardizem and use of metoprolol.  6-GERD (gastroesophageal reflux disease) -Continue PPI.  7-morbid obesity -Body mass index is 38.31 kg/m. -Calorie diet, portion control and increase physical activity discussed with patient.  DVT prophylaxis: Previously on chronic Eliquis; receiving heparin drip at this time. Code Status: Full code Family Communication: Patient sisters at bedside. Disposition Plan: Remains inpatient, n.p.o. after midnight; Eliquis anticoagulation discontinued and patient started on heparin drip.  Will transition off Cardizem drip and start  metoprolol; follow cardiology further  recommendations as she may end requiring heart cath versus nuclear stress test.  Troponins negative x2  Consultants:   Cardiology service  Procedures:   2D echo:  1. The anteroseptal wall is hypokinetic. Marland Kitchen Left ventricular ejection  fraction, by estimation, is 50 to 55%. The left ventricle has low normal  function. The left ventricle demonstrates regional wall motion  abnormalities (see scoring diagram/findings for  description). Left ventricular diastolic parameters are consistent with  Grade II diastolic dysfunction (pseudonormalization). Elevated left atrial  pressure.  2. Right ventricular systolic function is low normal. The right  ventricular size is normal. There is moderately elevated pulmonary artery  systolic pressure.  3. Left atrial size was severely dilated.  4. Right atrial size was severely dilated.  5. The mitral valve is abnormal. Moderate mitral valve regurgitation. No  evidence of mitral stenosis.  6. Tricuspid valve regurgitation is moderate.  7. The aortic valve is tricuspid. Aortic valve regurgitation is not  visualized. No aortic stenosis is present.  8. Mild pulmonary HTN, PASP is 37 mmHg.  9. The inferior vena cava is normal in size with greater than 50%  respiratory variability, suggesting right atrial pressure of 3 mmHg.   Antimicrobials:  Anti-infectives (From admission, onward)   None      Subjective: Reporting shortness of breath on exertion, no chest pain, no nausea, no vomiting.  Patient is afebrile, currently denying palpitations.  Objective: Vitals:   07/04/19 1700 07/04/19 1715 07/04/19 1716 07/04/19 1719  BP: 90/67     Pulse:  99 95 69  Resp: 19     Temp:      TempSrc:      SpO2: 96%     Weight:      Height:        Intake/Output Summary (Last 24 hours) at 07/04/2019 1758 Last data filed at 07/04/2019 1718 Gross per 24 hour  Intake 359.95 ml  Output 2300 ml  Net -1940.05 ml   Filed Weights   07/03/19 1703 07/04/19  0206  Weight: 99.8 kg 98.1 kg    Examination: General exam: Alert, awake, oriented x 3, currently reporting no chest pain and no palpitations; still feeling short of breath with activity.  No fever. Respiratory system: No wheezing, no using accessory muscles; good oxygen saturation on room air.  Decreased breath sounds at the bases and fine crackles appreciated. Cardiovascular system: Irregular, no rubs, no gallops, positive systolic ejection murmur. Gastrointestinal system: Abdomen is nondistended, soft and nontender. No organomegaly or masses felt. Normal bowel sounds heard. Central nervous system: Alert and oriented. No focal neurological deficits. Extremities: No cyanosis or clubbing; trace edema bilaterally. Skin: No rashes, no petechiae. Psychiatry: Judgement and insight appear normal. Mood & affect appropriate.     Data Reviewed: I have personally reviewed following labs and imaging studies  CBC: Recent Labs  Lab 07/03/19 1719  WBC 6.8  HGB 12.1  HCT 38.8  MCV 96.3  PLT 248   Basic Metabolic Panel: Recent Labs  Lab 07/03/19 1719 07/03/19 1921 07/04/19 0518  NA 141  --  142  K 3.7  --  3.7  CL 110  --  108  CO2 23  --  26  GLUCOSE 121*  --  102*  BUN 16  --  14  CREATININE 1.00  --  0.91  CALCIUM 8.8*  --  9.1  MG  --  1.8  --   PHOS  --  3.0  --  GFR: Estimated Creatinine Clearance: 58.7 mL/min (by C-G formula based on SCr of 0.91 mg/dL).  Coagulation Profile: Recent Labs  Lab 07/04/19 1032  INR 1.2   Thyroid Function Tests: Recent Labs    07/04/19 0518  TSH 3.019   Urine analysis:    Component Value Date/Time   COLORURINE YELLOW 02/25/2016 0810   APPEARANCEUR HAZY (A) 02/25/2016 0810   LABSPEC 1.020 02/25/2016 0810   PHURINE 6.0 02/25/2016 0810   GLUCOSEU NEGATIVE 02/25/2016 0810   HGBUR SMALL (A) 02/25/2016 0810   BILIRUBINUR NEGATIVE 02/25/2016 0810   KETONESUR NEGATIVE 02/25/2016 0810   PROTEINUR NEGATIVE 02/25/2016 0810    UROBILINOGEN 1.0 07/06/2013 1425   NITRITE NEGATIVE 02/25/2016 0810   LEUKOCYTESUR LARGE (A) 02/25/2016 0810    Recent Results (from the past 240 hour(s))  MRSA PCR Screening     Status: None   Collection Time: 07/04/19  1:54 AM   Specimen: Nasal Mucosa; Nasopharyngeal  Result Value Ref Range Status   MRSA by PCR NEGATIVE NEGATIVE Final    Comment:        The GeneXpert MRSA Assay (FDA approved for NASAL specimens only), is one component of a comprehensive MRSA colonization surveillance program. It is not intended to diagnose MRSA infection nor to guide or monitor treatment for MRSA infections. Performed at [facility], [address], [place], [place] [address]   Respiratory Panel by RT PCR (Flu A&B, Covid) - Nasopharyngeal Swab     Status: None   Collection Time: 07/04/19  7:41 AM   Specimen: Nasopharyngeal Swab  Result Value Ref Range Status   SARS Coronavirus 2 by RT PCR NEGATIVE NEGATIVE Final    Comment: (NOTE) SARS-CoV-2 target nucleic acids are NOT DETECTED. The SARS-CoV-2 RNA is generally detectable in upper respiratoy specimens during the acute phase of infection. The lowest concentration of SARS-CoV-2 viral copies this assay can detect is 131 copies/mL. A negative result does not preclude SARS-Cov-2 infection and should not be used as the sole basis for treatment or other patient management decisions. A negative result may occur with  improper specimen collection/handling, submission of specimen other than nasopharyngeal swab, presence of viral mutation(s) within the areas targeted by this assay, and inadequate number of viral copies (<131 copies/mL). A negative result must be combined with clinical observations, patient history, and epidemiological information. The expected result is Negative. Fact Sheet for Patients:  PinkCheek.be Fact Sheet for Healthcare Providers:  GravelBags.it This test is not yet  ap proved or cleared by the [place] FDA and  has been authorized for detection and/or diagnosis of SARS-CoV-2 by FDA under an Emergency Use Authorization (EUA). This EUA will remain  in effect (meaning this test can be used) for the duration of the COVID-19 declaration under Section 564(b)(1) of the Act, 21 U.S.C. section 360bbb-3(b)(1), unless the authorization is terminated or revoked sooner.    Influenza A by PCR NEGATIVE NEGATIVE Final   Influenza B by PCR NEGATIVE NEGATIVE Final    Comment: (NOTE) The Xpert Xpress SARS-CoV-2/FLU/RSV assay is intended as an aid in  the diagnosis of influenza from Nasopharyngeal swab specimens and  should not be used as a sole basis for treatment. Nasal washings and  aspirates are unacceptable for Xpert Xpress SARS-CoV-2/FLU/RSV  testing. Fact Sheet for Patients: PinkCheek.be Fact Sheet for Healthcare Providers: GravelBags.it This test is not yet approved or cleared by the [place] FDA and  has been authorized for detection and/or diagnosis of SARS-CoV-2 by  FDA under an Emergency  Use Authorization (EUA). This EUA will remain  in effect (meaning this test can be used) for the duration of the  Covid-19 declaration under Section 564(b)(1) of the Act, 21  U.S.C. section 360bbb-3(b)(1), unless the authorization is  terminated or revoked. Performed at [facility], [address], [place], [place] [address]      Radiology Studies: DG Chest 2 View  Result Date: 07/03/2019 CLINICAL DATA:  79 year old female with chest pain. EXAM: CHEST - 2 VIEW COMPARISON:  Chest radiograph dated 04/20/2016. FINDINGS: There is cardiomegaly with mild vascular congestion and probable mild edema. No focal consolidation, pleural effusion or pneumothorax. There is atherosclerotic calcification of the aorta. No acute osseous pathology. IMPRESSION: Cardiomegaly with findings of CHF. Electronically Signed   By:  Elgie Collard M.D.   On: 07/03/2019 18:46   ECHOCARDIOGRAM COMPLETE  Result Date: 07/04/2019    ECHOCARDIOGRAM REPORT   Patient Name:   [name] [facility] Date of Exam: 07/04/2019 Medical Rec #:  [id number]         Height:       63.0 in Accession #:    [id number]        Weight:       216.3 lb Date of Birth:  April 12, 1941         BSA:          1.999 m Patient Age:    78 years          BP:           110/80 mmHg Patient Gender: F                 HR:           92 bpm. [facility] Procedure: 2D Echo Indications:    Atrial Fibrillation 427.31 / I48.91  History:        Patient has prior history of Echocardiogram examinations, most                 recent 04/21/2016. CAD, Arrythmias:Atrial Fibrillation; Risk                 Factors:Hypertension, Dyslipidemia and Non-Smoker. PSVT.  Sonographer:    Jeryl Columbia RDCS (AE) Referring Phys: [id number] DAVID MANUEL ORTIZ IMPRESSIONS  1. The anteroseptal wall is hypokinetic. Marland Kitchen Left ventricular ejection fraction, by estimation, is 50 to 55%. The left ventricle has low normal function. The left ventricle demonstrates regional wall motion abnormalities (see scoring diagram/findings for description). Left ventricular diastolic parameters are consistent with Grade II diastolic dysfunction (pseudonormalization). Elevated left atrial pressure.  2. Right ventricular systolic function is low normal. The right ventricular size is normal. There is moderately elevated pulmonary artery systolic pressure.  3. Left atrial size was severely dilated.  4. Right atrial size was severely dilated.  5. The mitral valve is abnormal. Moderate mitral valve regurgitation. No evidence of mitral stenosis.  6. Tricuspid valve regurgitation is moderate.  7. The aortic valve is tricuspid. Aortic valve regurgitation is not visualized. No aortic stenosis is present.  8. Mild pulmonary HTN, PASP is 37 mmHg.  9. The inferior vena cava is normal in size with greater than 50% respiratory variability,  suggesting right atrial pressure of 3 mmHg. FINDINGS  Left Ventricle: The anteroseptal wall is hypokinetic. Left ventricular ejection fraction, by estimation, is 50 to 55%. The left ventricle has low normal function. The left ventricle demonstrates regional wall motion abnormalities. The left ventricular internal cavity size was normal in size. There  is no left ventricular hypertrophy. Left ventricular diastolic parameters are consistent with Grade II diastolic dysfunction (pseudonormalization). Elevated left atrial pressure. Right Ventricle: The right ventricular size is normal. No increase in right ventricular wall thickness. Right ventricular systolic function is low normal. There is moderately elevated pulmonary artery systolic pressure. The tricuspid regurgitant velocity  is 3.04 m/s, and with an assumed right atrial pressure of 10 mmHg, the estimated right ventricular systolic pressure is 47.0 mmHg. Left Atrium: Left atrial size was severely dilated. Right Atrium: Right atrial size was severely dilated. Pericardium: Trivial pericardial effusion is present. The pericardial effusion is circumferential. There is no evidence of cardiac tamponade. Mitral Valve: The mitral valve is abnormal. There is mild thickening of the mitral valve leaflet(s). There is mild calcification of the mitral valve leaflet(s). Mild mitral annular calcification. Moderate mitral valve regurgitation. No evidence of mitral  valve stenosis. Tricuspid Valve: The tricuspid valve is normal in structure. Tricuspid valve regurgitation is moderate . No evidence of tricuspid stenosis. Aortic Valve: The aortic valve is tricuspid. . There is mild thickening and mild calcification of the aortic valve. Aortic valve regurgitation is not visualized. No aortic stenosis is present. Mild aortic valve annular calcification. There is mild thickening of the aortic valve. There is mild calcification of the aortic valve. Aortic valve mean gradient measures 6.1  mmHg. Aortic valve peak gradient measures 12.1 mmHg. Aortic valve area, by VTI measures 1.44 cm. Pulmonic Valve: The pulmonic valve was not well visualized. Pulmonic valve regurgitation is not visualized. No evidence of pulmonic stenosis. Aorta: The aortic root is normal in size and structure. Pulmonary Artery: Mild pulmonary HTN, PASP is 37 mmHg. Venous: The inferior vena cava is normal in size with greater than 50% respiratory variability, suggesting right atrial pressure of 3 mmHg. IAS/Shunts: No atrial level shunt detected by color flow Doppler.  LEFT VENTRICLE PLAX 2D LVIDd:         5.21 cm  Diastology LVIDs:         3.50 cm  LV e' lateral:   7.29 cm/s LV PW:         0.90 cm  LV E/e' lateral: 19.5 LV IVS:        0.89 cm  LV e' medial:    7.62 cm/s LVOT diam:     1.90 cm  LV E/e' medial:  18.6 LV SV:         45 LV SV Index:   23 LVOT Area:     2.84 cm  RIGHT VENTRICLE RV S prime:     9.03 cm/s TAPSE (M-mode): 1.4 cm LEFT ATRIUM             Index LA diam:        4.10 cm 2.05 cm/m LA Vol (A2C):   84.8 ml 42.42 ml/m LA Vol (A4C):   85.0 ml 42.52 ml/m LA Biplane Vol: 88.3 ml 44.17 ml/m  AORTIC VALVE AV Area (Vmax):    1.48 cm AV Area (Vmean):   1.55 cm AV Area (VTI):     1.44 cm AV Vmax:           173.74 cm/s AV Vmean:          117.336 cm/s AV VTI:            0.314 m AV Peak Grad:      12.1 mmHg AV Mean Grad:      6.1 mmHg LVOT Vmax:         90.80 cm/s  LVOT Vmean:        64.300 cm/s LVOT VTI:          0.159 m LVOT/AV VTI ratio: 0.51  AORTA Ao Root diam: 2.70 cm MITRAL VALVE                 TRICUSPID VALVE MV Area (PHT): 4.54 cm      TR Peak grad:   37.0 mmHg MV VTI:        0.29 m        TR Vmax:        304.00 cm/s MV Decel Time: 167 msec MR Peak grad:    135.0 mmHg  SHUNTS MR Mean grad:    78.0 mmHg   Systemic VTI:  0.16 m MR Vmax:         581.00 cm/s Systemic Diam: 1.90 cm MR Vmean:        409.0 cm/s MR PISA:         1.57 cm MR PISA Eff ROA: 8 mm MR PISA Radius:  0.50 cm MV E velocity: 142.00 cm/s MV A  velocity: 64.30 cm/s MV E/A ratio:  2.21 Dina Rich MD Electronically signed by Dina Rich MD Signature Date/Time: 07/04/2019/3:52:34 PM    Final     Scheduled Meds: . Chlorhexidine Gluconate Cloth  6 each Topical Daily  . gabapentin  100 mg Oral QHS  . isosorbide mononitrate  30 mg Oral Daily  . lisinopril  20 mg Oral BID  . [START ON 07/05/2019] metoprolol succinate  25 mg Oral Daily  . pantoprazole  40 mg Oral Daily  . pravastatin  40 mg Oral QHS  . tamsulosin  0.4 mg Oral Daily   Continuous Infusions: . heparin 1,100 Units/hr (07/04/19 1652)     LOS: 0 days    Time spent: 35 minutes. Greater than 50% of this time was spent in direct contact with the patient, coordinating care and discussing relevant ongoing clinical issues, including discussion about heart score, recent for heparin drip, Cardizem drip on admission and the concerns for unstable angina, that might ended requiring heart catheterization.  Case was discussed with cardiology service and at this moment patient will remain on heparin drip and will be transition off Cardizem drip.Vassie Loll, MD [facility] Pager [phone]   07/04/2019, 5:58 PM

## 2019-07-04 NOTE — Consult Note (Addendum)
Cardiology Consultation:   Patient ID: [name] MRN: [id number]; DOB: 11-Nov-1940  Admit date: 07/03/2019 Date of Consult: 07/04/2019  Primary Care Provider: [name], MD Primary Cardiologist: [name], MD  Primary Electrophysiologist:  None    Patient Profile:   [name] is a 79 y.o. female with a hx of CM,  In 2011 with EF 40-45% and on last echo 2018 50-55%, CAD with non obst disease of 60% in LAD in 2002, LBBB, Persistent AFl with rate control,, HTN, CHF HLD who is being seen today for the evaluation of chest pain and a fib RVR at the request of Dr. Gwenlyn Perking.  History of Present Illness:   [name] with above hx and persistent a flutter, with strategy of HR control with BB and Eliquis for stroke prevention.  Her hx of NICM was in 2011 and on last echo 2018 EF up to 50-55%,  HTN controlled and Nonobstructive CAD by cath in 2002 with 60% LAD disease the same as in 2001.  She is on statin.  Neg nuc 2017 though could not rule out mild ischemia  Pt called the office yesterday with chest pain and she was instructed to go to ER.  She complained of chest pain that began over the weekend and associated with SOB, also fatigue, +PND with sleep and using 2 pillows at night to sleep.  She also complained of lower ext edema but no fever, N,V, or syncope.    On arrival her atrial flutter was at 121,  Placed on dilt drip.  Na 142, K+ 3.7 BUN 14, Cr 0.91 BNP 255  Hgb 12, Hct 38 plts 248  TSH 3.019   Troponin 4 and 5   CXR 2V  IMPRESSION: Cardiomegaly with findings of CHF.  She rec'd one dose of lasix 40 IV in ER.  And is negative 366  EKG:  The EKG was personally reviewed and demonstrates:  A fib at 123, LAD, LBBB and no acute ST changes from 07/2017,  The a fib is permanent.  Telemetry:  Telemetry was personally reviewed and demonstrates:  A fib rate controlled.  On dilt at 5.   Currently no pain, over 6 month is to a year she is DOE even waking in the house.  But  over last 6 months SOB and now chest pain have increased.  + chest pain and SOB walking up a hill from her mailbox.  Also helping her family in wheelchair pain will occur.  Rest and it resolves.    BP 128/66 P 67   Past Medical History:  Diagnosis Date  . Anemia   . Cardiomyopathy (HCC)    LVEF 40-45% 2011  . CHF (congestive heart failure) (HCC)   . Chronic insomnia   . Coronary atherosclerosis of native coronary artery    60% LAD in 2002  . DJD (degenerative joint disease)    Left THA  . Essential hypertension   . GERD (gastroesophageal reflux disease)   . Hyperlipidemia   . Hypothyroidism   . Left bundle [name]   . Nephrolithiasis   . Obesity   . Paroxysmal atrial flutter [facility])    Documented January 2018    Past Surgical History:  Procedure Laterality Date  . ABDOMINAL HYSTERECTOMY     (partial)  . APPENDECTOMY    . CHOLECYSTECTOMY    . COLONOSCOPY  Aug 2013   Dr. Loreta Evans, [facility]: small internal hemorrhoids and few scattered sigmoid diverticula, otherwise normal up to  TI, TI appeared normal  . ESOPHAGOGASTRODUODENOSCOPY N/A 06/01/2012   RMR:5 cm hiatal hernia. Essentially normal esophagus  . HEMORROIDECTOMY    . JOINT REPLACEMENT    . NOSE SURGERY    . REVISION TOTAL HIP ARTHROPLASTY  2009   Left THA 2009       Inpatient Medications: Scheduled Meds: . apixaban  5 mg Oral BID  . Chlorhexidine Gluconate Cloth  6 each Topical Daily  . gabapentin  100 mg Oral QHS  . isosorbide mononitrate  30 mg Oral Daily  . lisinopril  20 mg Oral BID  . metoprolol succinate  12.5 mg Oral Daily  . pantoprazole  40 mg Oral Daily  . pravastatin  40 mg Oral QHS  . tamsulosin  0.4 mg Oral Daily   Continuous Infusions: . diltiazem (CARDIZEM) infusion 5 mg/hr (07/04/19 0541)   PRN Meds: nitroGLYCERIN  Allergies:    Allergies  Allergen Reactions  . Codeine Itching    "feel like going buggy"  Itchy skin  . Tape Itching    Nylon tape makes pt feel itchy      Social History:   Social History   Socioeconomic History  . Marital status: Widowed    Spouse name: Not on file  . Number of children: Not on file  . Years of education: Not on file  . Highest education level: Not on file  Occupational History  . Occupation: retired    Comment: used to work at [employer] in [place]    Employer: RETIRED  Tobacco Use  . Smoking status: Never Smoker  . Smokeless tobacco: Never Used  Substance and Sexual Activity  . Alcohol use: No    Alcohol/week: 0.0 standard drinks  . Drug use: No  . Sexual activity: Not on file  Other Topics Concern  . Not on file  Social History Narrative  . Not on file   Social Determinants of Health   Financial Resource Strain:   . Difficulty of Paying Living Expenses:   Food Insecurity:   . Worried About Programme researcher, broadcasting/film/video in the Last Year:   . Barista in the Last Year:   Transportation Needs:   . Freight forwarder (Medical):   Marland Kitchen Lack of Transportation (Non-Medical):   Physical Activity:   . Days of Exercise per Week:   . Minutes of Exercise per Session:   Stress:   . Feeling of Stress :   Social Connections:   . Frequency of Communication with Friends and Family:   . Frequency of Social Gatherings with Friends and Family:   . Attends Religious Services:   . Active Member of Clubs or Organizations:   . Attends Banker Meetings:   Marland Kitchen Marital Status:   Intimate Partner Violence:   . Fear of Current or Ex-Partner:   . Emotionally Abused:   Marland Kitchen Physically Abused:   . Sexually Abused:     Family History:    Family History  Problem Relation Age of Onset  . Heart attack Father   . Heart attack Mother   . Diabetes Brother   . Colon cancer Neg Hx      ROS:  Please see the history of present illness.  General:no colds or fevers, no weight changes Skin:no rashes or ulcers HEENT:no blurred vision, no congestion CV:see HPI PUL:see HPI GI:no diarrhea constipation or melena, no  indigestion GU:no hematuria, no dysuria MS:no joint pain, no claudication, some lower ext edema Neuro:no syncope, no lightheadedness  Endo:no diabetes, no thyroid disease  All other ROS reviewed and negative.     Physical Exam/Data:   Vitals:   07/04/19 0700 07/04/19 0715 07/04/19 0730 07/04/19 0806  BP: 122/62 118/72 128/66   Pulse: 67 69 73   Resp: 16 18 18    Temp:      TempSrc:      SpO2: 99% 100% 99% 98%  Weight:      Height:        Intake/Output Summary (Last 24 hours) at 07/04/2019 0817 Last data filed at 07/04/2019 0541 Gross per 24 hour  Intake 33.1 ml  Output 400 ml  Net -366.9 ml   Last 3 Weights 07/04/2019 07/03/2019 06/05/2019  Weight (lbs) 216 lb 4.3 oz 220 lb 218 lb  Weight (kg) 98.1 kg 99.791 kg 98.884 kg     Body mass index is 38.31 kg/m.  General:  Well nourished, well developed, in no acute distress HEENT: normal Lymph: no adenopathy Neck: no JVD Endocrine:  No thryomegaly Vascular: No carotid bruits; pedal pulses 2+ bilaterally   Cardiac:  irreg irreg; no murmur , gallup rub or click Lungs:  clear to auscultation bilaterally, no wheezing, rhonchi or rales  Abd: soft, nontender, no hepatomegaly  Ext: trace edema Musculoskeletal:  No deformities, BUE and BLE strength normal and equal Skin: warm and dry  Neuro:  Alert and oriented X 3 MAE follows commands, no focal abnormalities noted Psych:  Normal affect    Relevant CV Studies: 08/2015 nuc study  This is a low risk study.  Nuclear stress EF: 63%.  Defect 1: There is a defect present in the mid anteroseptal, apical anterior and apical septal location. There is a mild degree of reversibility. While LBBB is likely contributing to defect, a small degree of ischemia cannot entirely be excluded.   Echo 2018 Study Conclusions   - Left ventricle: The cavity size was normal. Systolic function was  difficult to assess due to tachycardia, but appeared grossly  normal. The estimated ejection  fraction was in the range of 50%  to 55%. Features are consistent with a pseudonormal left  ventricular filling pattern, with concomitant abnormal relaxation  and increased filling pressure (grade 2 diastolic dysfunction).  Doppler parameters are consistent with high ventricular filling  pressure. Mild to moderate concentric LVH.  - Ventricular septum: Septal motion showed abnormal function and  dyssynergy. These changes are consistent with intraventricular  conduction delay.  - Mitral valve: Moderately calcified annulus. Normal thickness  leaflets . There was moderate regurgitation.  - Left atrium: The atrium was moderately to severely dilated.  - Right atrium: The atrium was mildly dilated.  - Atrial septum: A patent foramen ovale cannot be excluded.  - Tricuspid valve: There was mild regurgitation.  - Pulmonary arteries: PA peak pressure: 32 mm Hg (S).   -------------------------------------------------------------------  Study data: Comparison was made to the study of 09/12/2013. Study  status: Routine. Procedure: Transthoracic echocardiography.  Image quality was adequate.     Transthoracic  echocardiography. M-mode, complete 2D, spectral Doppler, and color  Doppler. Birthdate: Patient birthdate: 1940/09/16. Age: Patient  is 79 yr old. Sex: Gender: female.  BMI: 36.3 kg/m^2. Blood  pressure:   110/59 Patient status: Inpatient. Study date:  Study date: 04/21/2016. Study time: 09:15 AM. Location: Bedside.    -------------------------------------------------------------------   -------------------------------------------------------------------  Left ventricle: The cavity size was normal. Systolic function was  difficult to assess due to tachycardia, but appeared grossly  normal. The estimated ejection fraction was in  the range of 50% to  55%. Mild to moderate concentric LVH. Features are consistent with  a pseudonormal left ventricular  filling pattern, with concomitant  abnormal relaxation and increased filling pressure (grade 2  diastolic dysfunction). Doppler parameters are consistent with high  ventricular filling pressure.   -------------------------------------------------------------------  Aortic valve:  Trileaflet. Doppler:  There was no stenosis.  There was no significant regurgitation.   -------------------------------------------------------------------  Aorta: Aortic root: The aortic root was normal in size.   -------------------------------------------------------------------  Mitral valve:  Moderately calcified annulus. Normal thickness  leaflets . Doppler: There was moderate regurgitation.  Peak  gradient (D): 6 mm Hg.   -------------------------------------------------------------------  Left atrium: The atrium was moderately to severely dilated.   -------------------------------------------------------------------  Atrial septum: A patent foramen ovale cannot be excluded.   -------------------------------------------------------------------  Right ventricle: The cavity size was normal. Wall thickness was  normal. Systolic function was normal.   -------------------------------------------------------------------  Ventricular septum:  Septal motion showed abnormal function and  dyssynergy. These changes are consistent with intraventricular  conduction delay.   -------------------------------------------------------------------  Pulmonic valve:  The valve appears to be grossly normal.  Doppler: There was no significant regurgitation.   -------------------------------------------------------------------  Tricuspid valve:  Normal thickness leaflets. Doppler: There was  mild regurgitation.   -------------------------------------------------------------------  Right atrium: The atrium was mildly dilated.   -------------------------------------------------------------------    Pericardium: There was no pericardial effusion.   -------------------------------------------------------------------  Systemic veins:  Inferior vena cava: The vessel was normal in size. The  respirophasic diameter changes were in the normal range (>= 50%),  consistent with normal central venous pressure.   -------------------------------------------------------------------  Measurements   Laboratory Data:  High Sensitivity Troponin:   Recent Labs  Lab 07/03/19 1719 07/03/19 1921  TROPONINIHS 4 5     Chemistry Recent Labs  Lab 07/03/19 1719 07/04/19 0518  NA 141 142  K 3.7 3.7  CL 110 108  CO2 23 26  GLUCOSE 121* 102*  BUN 16 14  CREATININE 1.00 0.91  CALCIUM 8.8* 9.1  GFRNONAA 55* >60  GFRAA >60 >60  ANIONGAP 8 8    No results for input(s): PROT, ALBUMIN, AST, ALT, ALKPHOS, BILITOT in the last 168 hours. Hematology Recent Labs  Lab 07/03/19 1719  WBC 6.8  RBC 4.03  HGB 12.1  HCT 38.8  MCV 96.3  MCH 30.0  MCHC 31.2  RDW 14.0  PLT 248   BNP Recent Labs  Lab 07/03/19 1719  BNP 255.0*    DDimer  Recent Labs  Lab 07/03/19 1719  DDIMER 0.30     Radiology/Studies:  DG Chest 2 View  Result Date: 07/03/2019 CLINICAL DATA:  79 year old female with chest pain. EXAM: CHEST - 2 VIEW COMPARISON:  Chest radiograph dated 04/20/2016. FINDINGS: There is cardiomegaly with mild vascular congestion and probable mild edema. No focal consolidation, pleural effusion or pneumothorax. There is atherosclerotic calcification of the aorta. No acute osseous pathology. IMPRESSION: Cardiomegaly with findings of CHF. Electronically Signed   By: Elgie Collard M.D.   On: 07/03/2019 18:46       HEAR Score (for undifferentiated chest pain):   6    Assessment and Plan:   1. Chest pain with neg troponin last cath 2002 with 60% LAD stenosis,  Last nuc in 2017 with defect in mid anteroseptal, apical anterior and apical septal location - ischemia not entirely excluded.   Has  increasing DOE and now angina with exertion.  Will defer to Dr. Wyline Mood for cardiac cath with known CAD  vs nuc study. Other option of cardiac CTA - she did have BK today --if cath last dose of eliquis was last night. 2. Persistent /permanent a fib on eliquis and BB - was rapid HR on admit placed on dilt drip, continues on Eliquis- thia AM dose not yet given.  If cath change to IV heparin.   On dilt at 5 mg would stop with HR in the 60s and give her BB.  Again will defer to Dr. Harl Bowie 3. HTN controlled on ACE and BB 4. GERD no complaints currently       For questions or updates, please contact [place] Please consult [email or web address] for contact info under     Signed, Cecilie Kicks, NP  07/04/2019 8:17 AM  Attending note Patient seen and discussed with PA Dorene Ar, I agree with her documentation above. 79 yo female history of persistent atypical aflutter on rate control, NICM, HTN, moderate nonobstructive CAD by cath presents with chest pain, LE edema, orthopna, DOE  She reports starting Saturday night episodes of left sided sharp pain 8/10 in severity with some assocaited SOB. Worst with deep breathing. Would last 2-3 minutes at a time, could occur at rest or with exertion. Denies any palpitations. Also has had recent increased LE edema and orthopnea, DOE.   In ER found to be in afib with RVR, started on dilt gtt.   K 3.7 Cr 1 BUN 16 WBC 6.8 Hgb 12.1 Plt 248 Ddimer 30 BNP 255 Mg 1.8 TSH 3  hstrop 4--> 5 EKG afib, LBBB COVID neg CXR probable mild edema   1. Praroxysmal afib/aflutter - History of paroxysmal aflutter/afib, she is on eliquis at home. Started on dilt gtt here for afib with RVR.  - rates controlled at this time. D/c dilt gtt, increase her toprol to 25mg  daily. - hold eliquis,start hep gtt in case invasive procedures required this admission   2. Chest pain History of moderate CAD, had a 60-70% mid LAD lesion back in 2002.  EKG chronic LBBB, troponins negative thus far.  Echo is pending.  Medical therapy with imdur 30, toprol 12.5, pravastatin 20. No ASA since on eliquis.  Jan 2018 echo LVEF 50-55%, grade II DDX, mod MR  2002 cath: LAD prox 30%, 60-70% mid LAD, D2 40%,  08/2015 nuclear stress: cannot exclude mid anteroseptal, apical anterior and apical septal ischemia  - unclear cause of her pain, somewhat atypical as it was worst with deep breathing. She presented in afib with RVR as well as fluid overload, either could explain chest pain and SOB - follow symptoms with rate control and diuresis - f/u echo results - may consider ischemic testing depending on further workup. NPO at midnight   3. Acute on chronic diastolic HF - an 2703 echo LVEF 50-55%, grade II DDX, mod MR - reports poor compliance with lasix - has had some chronic LE edema since prior hip surgery - does report some icnreased edema, DOE, PND, orthopnea - BNP 255, CXR probable mild edema.   - limited I/Os thus far. Received lasix 40mg  IV x 1 yesterday. Downtrend in Cr with diuresis consistent with venous congestion and CHF - exacerbating factor may have been afib with RVR, as well as poor lasix compliance.  - redose IV lasix 40mg  x 1 today  4. Mitral regurgitation - mod by 2018 echo - f/u repeat echo in setting of volume overload  Carlyle Dolly MD

## 2019-07-04 NOTE — Progress Notes (Signed)
ANTICOAGULATION CONSULT NOTE - Follow-Up Consult  Pharmacy Consult for heparin IV Indication: atrial fibrillation  Allergies  Allergen Reactions  . Codeine Itching    "feel like going buggy"  Itchy skin  . Tape Itching    Nylon tape makes pt feel itchy    Patient Measurements: Height: 5\' 3"  (160 cm) Weight: 216 lb 4.3 oz (98.1 kg) IBW/kg (Calculated) : 52.4 Heparin Dosing Weight: 75 kg  Vital Signs: Temp: 98.7 F (37.1 C) (03/23 2000) Temp Source: Oral (03/23 2000) BP: 113/62 (03/23 2105) Pulse Rate: 89 (03/23 2000)  Labs: Recent Labs    07/03/19 1719 07/03/19 1921 07/04/19 0518 07/04/19 1032 07/04/19 2008  HGB 12.1  --   --   --   --   HCT 38.8  --   --   --   --   PLT 248  --   --   --   --   APTT  --   --   --  35 81*  LABPROT  --   --   --  14.9  --   INR  --   --   --  1.2  --   HEPARINUNFRC  --   --   --  1.86*  --   CREATININE 1.00  --  0.91  --   --   TROPONINIHS 4 5  --   --   --     Estimated Creatinine Clearance: 58.7 mL/min (by C-G formula based on SCr of 0.91 mg/dL).   Medical History: Past Medical History:  Diagnosis Date  . Anemia   . Cardiomyopathy (HCC)    LVEF 40-45% 2011  . CHF (congestive heart failure) (HCC)   . Chronic insomnia   . Coronary atherosclerosis of native coronary artery    60% LAD in 2002  . DJD (degenerative joint disease)    Left THA  . Essential hypertension   . GERD (gastroesophageal reflux disease)   . Hyperlipidemia   . Hypothyroidism   . Left bundle branch block   . Nephrolithiasis   . Obesity   . Paroxysmal atrial flutter [facility])    Documented January 2018    Medications:  Medications Prior to Admission  Medication Sig Dispense Refill Last Dose  . Calcium Carbonate-Vitamin D (CALCIUM 600 + D PO) Take 1 tablet by mouth daily.    07/03/2019 at Unknown time  . ELIQUIS 5 MG TABS tablet Take 1 tablet by mouth twice daily 60 tablet 11 07/03/2019 at 0830  . furosemide (LASIX) 40 MG tablet Take 20 mg by mouth  daily as needed for fluid or edema.    07/03/2019 at Unknown time  . gabapentin (NEURONTIN) 100 MG capsule Take 100 mg by mouth at bedtime.   07/02/2019 at Unknown time  . isosorbide mononitrate (IMDUR) 30 MG 24 hr tablet Take 1 tablet by mouth every day 30 tablet 11 07/03/2019 at Unknown time  . lisinopril (PRINIVIL,ZESTRIL) 20 MG tablet Take 20 mg by mouth 2 (two) times daily.    07/03/2019 at Unknown time  . metoprolol succinate (TOPROL-XL) 25 MG 24 hr tablet Take 0.5 tablets by mouth every evening (Patient taking differently: Take 12.5 mg by mouth daily. ) 15 tablet 11 07/02/2019 at 1900  . Multiple Vitamin (MULTIVITAMIN WITH MINERALS) TABS Take 1 tablet by mouth daily.   07/03/2019 at Unknown time  . nitroGLYCERIN (NITROSTAT) 0.4 MG SL tablet Place 1 tablet (0.4 mg total) under the tongue every 5 (five) minutes as needed. For  chest pain 25 tablet 3 07/03/2019 at Unknown time  . omeprazole (PRILOSEC) 20 MG capsule Take 20 mg by mouth every morning.    07/03/2019 at Unknown time  . pravastatin (PRAVACHOL) 40 MG tablet Take 40 mg by mouth at bedtime.    07/02/2019 at Unknown time  . tamsulosin (FLOMAX) 0.4 MG CAPS capsule Take 1 capsule (0.4 mg total) by mouth daily. 30 capsule 0 07/03/2019 at Unknown time    Assessment: 79 yo F on Eliquis prior to admission for afib.  Pharmacy has been consulted to dose heparin pending need for procedure.  Last dose of Eliquis given 3/22 0830 so will need to monitor based on aPTT until correlation with heparin levels.    Initial aPTT within goal on heparin at 1100 units/hr.  Goal of Therapy:  Heparin level 0.3-0.7 units/ml aPTT 66-102 seconds Monitor platelets by anticoagulation protocol: Yes   Plan:  Continue heparin infusion at 1100 units/hr Confirmation anti-Xa level and aPTT in 8 hours and daily while on heparin Continue to monitor H&H and platelets  [employer], Pharm.D., BCPS Clinical Pharmacist Clinical phone for 07/04/2019 is  [phone].  **Pharmacist phone directory can be found on [email or web address] listed under [place].  07/04/2019 9:21 PM

## 2019-07-04 NOTE — Progress Notes (Signed)
*  PRELIMINARY RESULTS* Echocardiogram 2D Echocardiogram has been performed.  Jeryl Columbia 07/04/2019, 12:31 PM

## 2019-07-05 DIAGNOSIS — I2510 Atherosclerotic heart disease of native coronary artery without angina pectoris: Secondary | ICD-10-CM

## 2019-07-05 DIAGNOSIS — I5033 Acute on chronic diastolic (congestive) heart failure: Secondary | ICD-10-CM

## 2019-07-05 LAB — CBC
HCT: 42.1 % (ref 36.0–46.0)
Hemoglobin: 13.3 g/dL (ref 12.0–15.0)
MCH: 30.2 pg (ref 26.0–34.0)
MCHC: 31.6 g/dL (ref 30.0–36.0)
MCV: 95.7 fL (ref 80.0–100.0)
Platelets: 262 10*3/uL (ref 150–400)
RBC: 4.4 MIL/uL (ref 3.87–5.11)
RDW: 13.8 % (ref 11.5–15.5)
WBC: 6.7 10*3/uL (ref 4.0–10.5)
nRBC: 0 % (ref 0.0–0.2)

## 2019-07-05 LAB — BASIC METABOLIC PANEL
Anion gap: 10 (ref 5–15)
BUN: 17 mg/dL (ref 8–23)
CO2: 24 mmol/L (ref 22–32)
Calcium: 9.3 mg/dL (ref 8.9–10.3)
Chloride: 106 mmol/L (ref 98–111)
Creatinine, Ser: 0.9 mg/dL (ref 0.44–1.00)
GFR calc Af Amer: >60 mL/min (ref >60)
GFR calc non Af Amer: >60 mL/min (ref >60)
Glucose, Bld: 101 mg/dL — ABNORMAL HIGH (ref 70–99)
Potassium: 4 mmol/L (ref 3.5–5.1)
Sodium: 140 mmol/L (ref 135–145)

## 2019-07-05 LAB — HEPARIN LEVEL (UNFRACTIONATED): Heparin Unfractionated: 1.36 IU/mL — ABNORMAL HIGH (ref 0.30–0.70)

## 2019-07-05 LAB — APTT: aPTT: 93 seconds — ABNORMAL HIGH (ref 24–36)

## 2019-07-05 MED ORDER — APIXABAN 5 MG PO TABS
5.0000 mg | ORAL_TABLET | Freq: Two times a day (BID) | ORAL | Status: DC
  Administered 2019-07-05 – 2019-07-06 (×3): 5 mg via ORAL
  Filled 2019-07-05 (×3): qty 1

## 2019-07-05 MED ORDER — METOPROLOL SUCCINATE ER 50 MG PO TB24
50.0000 mg | ORAL_TABLET | Freq: Every day | ORAL | Status: DC
  Administered 2019-07-05 – 2019-07-06 (×2): 50 mg via ORAL
  Filled 2019-07-05 (×2): qty 1

## 2019-07-05 NOTE — Progress Notes (Signed)
PROGRESS NOTE  [name] JSH:[id number] DOB: 01/18/1941 DOA: 07/03/2019 PCP: [name], MD  Brief History:  79 year old female with a history of persistent atrial flutter on apixaban, and ICM, hypertension, nonobstructive CAD, left bundle branch block, hyperlipidemia, hypothyroidism presented with 2 day history of shortness of breath and chest pain.  She contacted her cardiology office, and the patient was instructed to go to the emergency department.  The patient also endorsed PND and 2 pillows at night to sleep.  She complained of some worsening lower extremity edema.  In the ED, the patient was noted to have atrial flutter with heart rate in the 120s.  She was started on a diltiazem drip.  Chest x-ray showed vascular congestion.  She was started on IV furosemide.  Assessment/Plan: Paroxysmal atrial fibrillation/atrial flutter -Appreciate cardiology follow-up -Initially on diltiazem and IV heparin -Metoprolol succinate increased to 50 mg daily -Continue apixaban  Acute on chronic diastolic CHF -Patient received 2 doses of IV furosemide -Reports poor compliance with furosemide at home -07/04/2019 echo EF 50-55%, grade 2 DD, moderate MR/TR -Transition to oral furosemide -continue lisinopril  Chest pain -Troponins unremarkable -History of moderate CAD, had a 60-70% mid LAD lesion back in 2002.  EKG chronic LBBB, troponins negative Medical therapy with imdur 30, toprol 12.5, pravastatin 20. -No plans for ischemic testing per cardiology -continue imdur  Hyperlipidemia -continue statin  Essential Hypertension -continue metoprolol succate   Disposition Plan: Patient From: Home D/C Place: Home 3/25 if labs stable and pt euvolemic Barriers:   Family Communication:  No Family at bedside  Consultants:  cardiology  Code Status:  FULL   DVT Prophylaxis:  apixaban   Procedures: As Listed in Progress Note  Above  Antibiotics: None      Subjective: Patient denies fevers, chills, headache, chest pain, dyspnea, nausea, vomiting, diarrhea, abdominal pain, dysuria, hematuria, hematochezia, and melena.   Objective: Vitals:   07/05/19 1000 07/05/19 1100 07/05/19 1200 07/05/19 1203  BP: 97/62 103/71 106/63   Pulse: 93 84 83   Resp: (!) 21 (!) 27 20   Temp:    98.1 F (36.7 C)  TempSrc:    Oral  SpO2: 96% 99% 95%   Weight:      Height:        Intake/Output Summary (Last 24 hours) at 07/05/2019 1350 Last data filed at 07/05/2019 1300 Gross per 24 hour  Intake 505.08 ml  Output 2200 ml  Net -1694.92 ml   Weight change: -4.591 kg Exam:   General:  Pt is alert, follows commands appropriately, not in acute distress  HEENT: No icterus, No thrush, No neck mass, [place]/AT  Cardiovascular: IRRR, S1/S2, no rubs, no gallops  Respiratory: bibasilar crackles. No wheeze  Abdomen: Soft/+BS, non tender, non distended, no guarding  Extremities: No edema, No lymphangitis, No petechiae, No rashes, no synovitis   Data Reviewed: I have personally reviewed following labs and imaging studies Basic Metabolic Panel: Recent Labs  Lab 07/03/19 1719 07/03/19 1921 07/04/19 0518 07/05/19 0838  NA 141  --  142 140  K 3.7  --  3.7 4.0  CL 110  --  108 106  CO2 23  --  26 24  GLUCOSE 121*  --  102* 101*  BUN 16  --  14 17  CREATININE 1.00  --  0.91 0.90  CALCIUM 8.8*  --  9.1 9.3  MG  --  1.8  --   --  PHOS  --  3.0  --   --    Liver Function Tests: No results for input(s): AST, ALT, ALKPHOS, BILITOT, PROT, ALBUMIN in the last 168 hours. No results for input(s): LIPASE, AMYLASE in the last 168 hours. No results for input(s): AMMONIA in the last 168 hours. Coagulation Profile: Recent Labs  Lab 07/04/19 1032  INR 1.2   CBC: Recent Labs  Lab 07/03/19 1719 07/05/19 0428  WBC 6.8 6.7  HGB 12.1 13.3  HCT 38.8 42.1  MCV 96.3 95.7  PLT 248 262   Cardiac Enzymes: No results for  input(s): CKTOTAL, CKMB, CKMBINDEX, TROPONINI in the last 168 hours. BNP: Invalid input(s): POCBNP CBG: No results for input(s): GLUCAP in the last 168 hours. HbA1C: No results for input(s): HGBA1C in the last 72 hours. Urine analysis:    Component Value Date/Time   COLORURINE YELLOW 02/25/2016 0810   APPEARANCEUR HAZY (A) 02/25/2016 0810   LABSPEC 1.020 02/25/2016 0810   PHURINE 6.0 02/25/2016 0810   GLUCOSEU NEGATIVE 02/25/2016 0810   HGBUR SMALL (A) 02/25/2016 0810   BILIRUBINUR NEGATIVE 02/25/2016 0810   KETONESUR NEGATIVE 02/25/2016 0810   PROTEINUR NEGATIVE 02/25/2016 0810   UROBILINOGEN 1.0 07/06/2013 1425   NITRITE NEGATIVE 02/25/2016 0810   LEUKOCYTESUR LARGE (A) 02/25/2016 0810   Sepsis Labs: @LABRCNTIP (procalcitonin:4,lacticidven:4) ) Recent Results (from the past 240 hour(s))  MRSA PCR Screening     Status: None   Collection Time: 07/04/19  1:54 AM   Specimen: Nasal Mucosa; Nasopharyngeal  Result Value Ref Range Status   MRSA by PCR NEGATIVE NEGATIVE Final    Comment:        The GeneXpert MRSA Assay (FDA approved for NASAL specimens only), is one component of a comprehensive MRSA colonization surveillance program. It is not intended to diagnose MRSA infection nor to guide or monitor treatment for MRSA infections. Performed at [facility], [address] [place], [place]   Respiratory Panel by RT PCR (Flu A&B, Covid) - Nasopharyngeal Swab     Status: None   Collection Time: 07/04/19  7:41 AM   Specimen: Nasopharyngeal Swab  Result Value Ref Range Status   SARS Coronavirus 2 by RT PCR NEGATIVE NEGATIVE Final    Comment: (NOTE) SARS-CoV-2 target nucleic acids are NOT DETECTED. The SARS-CoV-2 RNA is generally detectable in upper respiratoy specimens during the acute phase of infection. The lowest concentration of SARS-CoV-2 viral copies this assay can detect is 131 copies/mL. A negative result does not preclude SARS-Cov-2 infection and should  not be used as the sole basis for treatment or other patient management decisions. A negative result may occur with  improper specimen collection/handling, submission of specimen other than nasopharyngeal swab, presence of viral mutation(s) within the areas targeted by this assay, and inadequate number of viral copies (<131 copies/mL). A negative result must be combined with clinical observations, patient history, and epidemiological information. The expected result is Negative. Fact Sheet for Patients:  07/06/19 Fact Sheet for Healthcare Providers:  https://www.moore.com/ This test is not yet ap proved or cleared by the https://www.young.biz/ FDA and  has been authorized for detection and/or diagnosis of SARS-CoV-2 by FDA under an Emergency Use Authorization (EUA). This EUA will remain  in effect (meaning this test can be used) for the duration of the COVID-19 declaration under Section 564(b)(1) of the Act, 21 U.S.C. section 360bbb-3(b)(1), unless the authorization is terminated or revoked sooner.    Influenza A by PCR NEGATIVE NEGATIVE Final   Influenza B  by PCR NEGATIVE NEGATIVE Final    Comment: (NOTE) The Xpert Xpress SARS-CoV-2/FLU/RSV assay is intended as an aid in  the diagnosis of influenza from Nasopharyngeal swab specimens and  should not be used as a sole basis for treatment. Nasal washings and  aspirates are unacceptable for Xpert Xpress SARS-CoV-2/FLU/RSV  testing. Fact Sheet for Patients: https://www.moore.com/ Fact Sheet for Healthcare Providers: https://www.young.biz/ This test is not yet approved or cleared by the [place] FDA and  has been authorized for detection and/or diagnosis of SARS-CoV-2 by  FDA under an Emergency Use Authorization (EUA). This EUA will remain  in effect (meaning this test can be used) for the duration of the  Covid-19 declaration under Section 564(b)(1)  of the Act, 21  U.S.C. section 360bbb-3(b)(1), unless the authorization is  terminated or revoked. Performed at [facility], [address] [place], [place] [address]      Scheduled Meds: . apixaban  5 mg Oral BID  . Chlorhexidine Gluconate Cloth  6 each Topical Daily  . gabapentin  100 mg Oral QHS  . isosorbide mononitrate  30 mg Oral Daily  . lisinopril  20 mg Oral BID  . metoprolol succinate  50 mg Oral Daily  . pantoprazole  40 mg Oral Daily  . pravastatin  40 mg Oral QHS  . tamsulosin  0.4 mg Oral Daily   Continuous Infusions:  Procedures/Studies: DG Chest 2 View  Result Date: 07/03/2019 CLINICAL DATA:  79 year old female with chest pain. EXAM: CHEST - 2 VIEW COMPARISON:  Chest radiograph dated 04/20/2016. FINDINGS: There is cardiomegaly with mild vascular congestion and probable mild edema. No focal consolidation, pleural effusion or pneumothorax. There is atherosclerotic calcification of the aorta. No acute osseous pathology. IMPRESSION: Cardiomegaly with findings of CHF. Electronically Signed   By: Elgie Collard M.D.   On: 07/03/2019 18:46   ECHOCARDIOGRAM COMPLETE  Result Date: 07/04/2019    ECHOCARDIOGRAM REPORT   Patient Name:   KIMBRLY FERWERDA [facility] Date of Exam: 07/04/2019 Medical Rec #:  [id number]         Height:       63.0 in Accession #:    [id number]        Weight:       216.3 lb Date of Birth:  1940/04/22         BSA:          1.999 m Patient Age:    78 years          BP:           110/80 mmHg Patient Gender: F                 HR:           92 bpm. [facility] Procedure: 2D Echo Indications:    Atrial Fibrillation 427.31 / I48.91  History:        Patient has prior history of Echocardiogram examinations, most                 recent 04/21/2016. CAD, Arrythmias:Atrial Fibrillation; Risk                 Factors:Hypertension, Dyslipidemia and Non-Smoker. PSVT.  Sonographer:    Jeryl Columbia RDCS (AE) Referring Phys: [id number] Shaka Cardin MANUEL ORTIZ IMPRESSIONS  1. The  anteroseptal wall is hypokinetic. Marland Kitchen Left ventricular ejection fraction, by estimation, is 50 to 55%. The left ventricle has low normal function. The left ventricle demonstrates regional wall motion abnormalities (see  scoring diagram/findings for description). Left ventricular diastolic parameters are consistent with Grade II diastolic dysfunction (pseudonormalization). Elevated left atrial pressure.  2. Right ventricular systolic function is low normal. The right ventricular size is normal. There is moderately elevated pulmonary artery systolic pressure.  3. Left atrial size was severely dilated.  4. Right atrial size was severely dilated.  5. The mitral valve is abnormal. Moderate mitral valve regurgitation. No evidence of mitral stenosis.  6. Tricuspid valve regurgitation is moderate.  7. The aortic valve is tricuspid. Aortic valve regurgitation is not visualized. No aortic stenosis is present.  8. Mild pulmonary HTN, PASP is 37 mmHg.  9. The inferior vena cava is normal in size with greater than 50% respiratory variability, suggesting right atrial pressure of 3 mmHg. FINDINGS  Left Ventricle: The anteroseptal wall is hypokinetic. Left ventricular ejection fraction, by estimation, is 50 to 55%. The left ventricle has low normal function. The left ventricle demonstrates regional wall motion abnormalities. The left ventricular internal cavity size was normal in size. There is no left ventricular hypertrophy. Left ventricular diastolic parameters are consistent with Grade II diastolic dysfunction (pseudonormalization). Elevated left atrial pressure. Right Ventricle: The right ventricular size is normal. No increase in right ventricular wall thickness. Right ventricular systolic function is low normal. There is moderately elevated pulmonary artery systolic pressure. The tricuspid regurgitant velocity  is 3.04 m/s, and with an assumed right atrial pressure of 10 mmHg, the estimated right ventricular systolic pressure  is 47.0 mmHg. Left Atrium: Left atrial size was severely dilated. Right Atrium: Right atrial size was severely dilated. Pericardium: Trivial pericardial effusion is present. The pericardial effusion is circumferential. There is no evidence of cardiac tamponade. Mitral Valve: The mitral valve is abnormal. There is mild thickening of the mitral valve leaflet(s). There is mild calcification of the mitral valve leaflet(s). Mild mitral annular calcification. Moderate mitral valve regurgitation. No evidence of mitral  valve stenosis. Tricuspid Valve: The tricuspid valve is normal in structure. Tricuspid valve regurgitation is moderate . No evidence of tricuspid stenosis. Aortic Valve: The aortic valve is tricuspid. . There is mild thickening and mild calcification of the aortic valve. Aortic valve regurgitation is not visualized. No aortic stenosis is present. Mild aortic valve annular calcification. There is mild thickening of the aortic valve. There is mild calcification of the aortic valve. Aortic valve mean gradient measures 6.1 mmHg. Aortic valve peak gradient measures 12.1 mmHg. Aortic valve area, by VTI measures 1.44 cm. Pulmonic Valve: The pulmonic valve was not well visualized. Pulmonic valve regurgitation is not visualized. No evidence of pulmonic stenosis. Aorta: The aortic root is normal in size and structure. Pulmonary Artery: Mild pulmonary HTN, PASP is 37 mmHg. Venous: The inferior vena cava is normal in size with greater than 50% respiratory variability, suggesting right atrial pressure of 3 mmHg. IAS/Shunts: No atrial level shunt detected by color flow Doppler.  LEFT VENTRICLE PLAX 2D LVIDd:         5.21 cm  Diastology LVIDs:         3.50 cm  LV e' lateral:   7.29 cm/s LV PW:         0.90 cm  LV E/e' lateral: 19.5 LV IVS:        0.89 cm  LV e' medial:    7.62 cm/s LVOT diam:     1.90 cm  LV E/e' medial:  18.6 LV SV:         45 LV SV Index:   23 LVOT  Area:     2.84 cm  RIGHT VENTRICLE RV S prime:      9.03 cm/s TAPSE (M-mode): 1.4 cm LEFT ATRIUM             Index LA diam:        4.10 cm 2.05 cm/m LA Vol (A2C):   84.8 ml 42.42 ml/m LA Vol (A4C):   85.0 ml 42.52 ml/m LA Biplane Vol: 88.3 ml 44.17 ml/m  AORTIC VALVE AV Area (Vmax):    1.48 cm AV Area (Vmean):   1.55 cm AV Area (VTI):     1.44 cm AV Vmax:           173.74 cm/s AV Vmean:          117.336 cm/s AV VTI:            0.314 m AV Peak Grad:      12.1 mmHg AV Mean Grad:      6.1 mmHg LVOT Vmax:         90.80 cm/s LVOT Vmean:        64.300 cm/s LVOT VTI:          0.159 m LVOT/AV VTI ratio: 0.51  AORTA Ao Root diam: 2.70 cm MITRAL VALVE                 TRICUSPID VALVE MV Area (PHT): 4.54 cm      TR Peak grad:   37.0 mmHg MV VTI:        0.29 m        TR Vmax:        304.00 cm/s MV Decel Time: 167 msec MR Peak grad:    135.0 mmHg  SHUNTS MR Mean grad:    78.0 mmHg   Systemic VTI:  0.16 m MR Vmax:         581.00 cm/s Systemic Diam: 1.90 cm MR Vmean:        409.0 cm/s MR PISA:         1.57 cm MR PISA Eff ROA: 8 mm MR PISA Radius:  0.50 cm MV E velocity: 142.00 cm/s MV A velocity: 64.30 cm/s MV E/A ratio:  2.21 Carlyle Dolly MD Electronically signed by Carlyle Dolly MD Signature Date/Time: 07/04/2019/3:52:34 PM    Final     Orson Eva, DO  [facility]  If 7PM-7AM, please contact night-coverage [email or web address] Password [email or web address] 07/05/2019, 1:50 PM   LOS: 1 day

## 2019-07-05 NOTE — Progress Notes (Signed)
Progress Note  Patient Name: [name] Date of Encounter: 07/05/2019  Primary Cardiologist: [name], MD   Subjective   No significant chest pain, SOB improving.   Inpatient Medications    Scheduled Meds: . Chlorhexidine Gluconate Cloth  6 each Topical Daily  . gabapentin  100 mg Oral QHS  . isosorbide mononitrate  30 mg Oral Daily  . lisinopril  20 mg Oral BID  . metoprolol succinate  25 mg Oral Daily  . pantoprazole  40 mg Oral Daily  . pravastatin  40 mg Oral QHS  . tamsulosin  0.4 mg Oral Daily   Continuous Infusions: . heparin 1,100 Units/hr (07/05/19 0624)   PRN Meds: nitroGLYCERIN   Vital Signs    Vitals:   07/05/19 0500 07/05/19 0600 07/05/19 0700 07/05/19 0749  BP: 127/70 133/63 132/75   Pulse: 80 (!) 109 91   Resp: 19 17 20    Temp: 98 F (36.7 C)     TempSrc: Oral   (P) Oral  SpO2: 95% 96% 95%   Weight: 95.2 kg     Height:        Intake/Output Summary (Last 24 hours) at 07/05/2019 0813 Last data filed at 07/05/2019 0650 Gross per 24 hour  Intake 475.73 ml  Output 2950 ml  Net -2474.27 ml   Last 3 Weights 07/05/2019 07/04/2019 07/03/2019  Weight (lbs) 209 lb 14.1 oz 216 lb 4.3 oz 220 lb  Weight (kg) 95.2 kg 98.1 kg 99.791 kg      Telemetry    afib variable rates - Personally Reviewed  ECG    n/a - Personally Reviewed  Physical Exam   GEN: No acute distress.   Neck: No JVD Cardiac: irreg Respiratory: Clear to auscultation bilaterally. GI: Soft, nontender, non-distended  MS: No edema; No deformity. Neuro:  Nonfocal  Psych: Normal affect   Labs    High Sensitivity Troponin:   Recent Labs  Lab 07/03/19 1719 07/03/19 1921  TROPONINIHS 4 5      Chemistry Recent Labs  Lab 07/03/19 1719 07/04/19 0518  NA 141 142  K 3.7 3.7  CL 110 108  CO2 23 26  GLUCOSE 121* 102*  BUN 16 14  CREATININE 1.00 0.91  CALCIUM 8.8* 9.1  GFRNONAA 55* >60  GFRAA >60 >60  ANIONGAP 8 8     Hematology Recent Labs  Lab  07/03/19 1719 07/05/19 0428  WBC 6.8 6.7  RBC 4.03 4.40  HGB 12.1 13.3  HCT 38.8 42.1  MCV 96.3 95.7  MCH 30.0 30.2  MCHC 31.2 31.6  RDW 14.0 13.8  PLT 248 262    BNP Recent Labs  Lab 07/03/19 1719  BNP 255.0*     DDimer  Recent Labs  Lab 07/03/19 1719  DDIMER 0.30     Radiology    DG Chest 2 View  Result Date: 07/03/2019 CLINICAL DATA:  79 year old female with chest pain. EXAM: CHEST - 2 VIEW COMPARISON:  Chest radiograph dated 04/20/2016. FINDINGS: There is cardiomegaly with mild vascular congestion and probable mild edema. No focal consolidation, pleural effusion or pneumothorax. There is atherosclerotic calcification of the aorta. No acute osseous pathology. IMPRESSION: Cardiomegaly with findings of CHF. Electronically Signed   By: 06/18/2016 M.D.   On: 07/03/2019 18:46   ECHOCARDIOGRAM COMPLETE  Result Date: 07/04/2019    ECHOCARDIOGRAM REPORT   Patient Name:   [name] [facility] Date of Exam: 07/04/2019 Medical Rec #:  07/06/2019  Height:       63.0 in Accession #:    [id number]        Weight:       216.3 lb Date of Birth:  1941-01-30         BSA:          1.999 m Patient Age:    79 years          BP:           110/80 mmHg Patient Gender: F                 HR:           92 bpm. Exam Location:  [facility] Procedure: 2D Echo Indications:    Atrial Fibrillation 427.31 / I48.91  History:        Patient has prior history of Echocardiogram examinations, most                 recent 04/21/2016. CAD, Arrythmias:Atrial Fibrillation; Risk                 Factors:Hypertension, Dyslipidemia and Non-Smoker. PSVT.  Sonographer:    Leavy Cella RDCS (AE) Referring Phys: [id number] DAVID MANUEL [place]  1. The anteroseptal wall is hypokinetic. Marland Kitchen Left ventricular ejection fraction, by estimation, is 50 to 55%. The left ventricle has low normal function. The left ventricle demonstrates regional wall motion abnormalities (see scoring diagram/findings for description). Left  ventricular diastolic parameters are consistent with Grade II diastolic dysfunction (pseudonormalization). Elevated left atrial pressure.  2. Right ventricular systolic function is low normal. The right ventricular size is normal. There is moderately elevated pulmonary artery systolic pressure.  3. Left atrial size was severely dilated.  4. Right atrial size was severely dilated.  5. The mitral valve is abnormal. Moderate mitral valve regurgitation. No evidence of mitral stenosis.  6. Tricuspid valve regurgitation is moderate.  7. The aortic valve is tricuspid. Aortic valve regurgitation is not visualized. No aortic stenosis is present.  8. Mild pulmonary HTN, PASP is 37 mmHg.  9. The inferior vena cava is normal in size with greater than 50% respiratory variability, suggesting right atrial pressure of 3 mmHg. FINDINGS  Left Ventricle: The anteroseptal wall is hypokinetic. Left ventricular ejection fraction, by estimation, is 50 to 55%. The left ventricle has low normal function. The left ventricle demonstrates regional wall motion abnormalities. The left ventricular internal cavity size was normal in size. There is no left ventricular hypertrophy. Left ventricular diastolic parameters are consistent with Grade II diastolic dysfunction (pseudonormalization). Elevated left atrial pressure. Right Ventricle: The right ventricular size is normal. No increase in right ventricular wall thickness. Right ventricular systolic function is low normal. There is moderately elevated pulmonary artery systolic pressure. The tricuspid regurgitant velocity  is 3.04 m/s, and with an assumed right atrial pressure of 10 mmHg, the estimated right ventricular systolic pressure is 30.8 mmHg. Left Atrium: Left atrial size was severely dilated. Right Atrium: Right atrial size was severely dilated. Pericardium: Trivial pericardial effusion is present. The pericardial effusion is circumferential. There is no evidence of cardiac tamponade.  Mitral Valve: The mitral valve is abnormal. There is mild thickening of the mitral valve leaflet(s). There is mild calcification of the mitral valve leaflet(s). Mild mitral annular calcification. Moderate mitral valve regurgitation. No evidence of mitral  valve stenosis. Tricuspid Valve: The tricuspid valve is normal in structure. Tricuspid valve regurgitation is moderate . No evidence of tricuspid stenosis. Aortic Valve: The aortic valve  is tricuspid. . There is mild thickening and mild calcification of the aortic valve. Aortic valve regurgitation is not visualized. No aortic stenosis is present. Mild aortic valve annular calcification. There is mild thickening of the aortic valve. There is mild calcification of the aortic valve. Aortic valve mean gradient measures 6.1 mmHg. Aortic valve peak gradient measures 12.1 mmHg. Aortic valve area, by VTI measures 1.44 cm. Pulmonic Valve: The pulmonic valve was not well visualized. Pulmonic valve regurgitation is not visualized. No evidence of pulmonic stenosis. Aorta: The aortic root is normal in size and structure. Pulmonary Artery: Mild pulmonary HTN, PASP is 37 mmHg. Venous: The inferior vena cava is normal in size with greater than 50% respiratory variability, suggesting right atrial pressure of 3 mmHg. IAS/Shunts: No atrial level shunt detected by color flow Doppler.  LEFT VENTRICLE PLAX 2D LVIDd:         5.21 cm  Diastology LVIDs:         3.50 cm  LV e' lateral:   7.29 cm/s LV PW:         0.90 cm  LV E/e' lateral: 19.5 LV IVS:        0.89 cm  LV e' medial:    7.62 cm/s LVOT diam:     1.90 cm  LV E/e' medial:  18.6 LV SV:         45 LV SV Index:   23 LVOT Area:     2.84 cm  RIGHT VENTRICLE RV S prime:     9.03 cm/s TAPSE (M-mode): 1.4 cm LEFT ATRIUM             Index LA diam:        4.10 cm 2.05 cm/m LA Vol (A2C):   84.8 ml 42.42 ml/m LA Vol (A4C):   85.0 ml 42.52 ml/m LA Biplane Vol: 88.3 ml 44.17 ml/m  AORTIC VALVE AV Area (Vmax):    1.48 cm AV Area (Vmean):    1.55 cm AV Area (VTI):     1.44 cm AV Vmax:           173.74 cm/s AV Vmean:          117.336 cm/s AV VTI:            0.314 m AV Peak Grad:      12.1 mmHg AV Mean Grad:      6.1 mmHg LVOT Vmax:         90.80 cm/s LVOT Vmean:        64.300 cm/s LVOT VTI:          0.159 m LVOT/AV VTI ratio: 0.51  AORTA Ao Root diam: 2.70 cm MITRAL VALVE                 TRICUSPID VALVE MV Area (PHT): 4.54 cm      TR Peak grad:   37.0 mmHg MV VTI:        0.29 m        TR Vmax:        304.00 cm/s MV Decel Time: 167 msec MR Peak grad:    135.0 mmHg  SHUNTS MR Mean grad:    78.0 mmHg   Systemic VTI:  0.16 m MR Vmax:         581.00 cm/s Systemic Diam: 1.90 cm MR Vmean:        409.0 cm/s MR PISA:         1.57 cm MR PISA Eff ROA: 8 mm MR  PISA Radius:  0.50 cm MV E velocity: 142.00 cm/s MV A velocity: 64.30 cm/s MV E/A ratio:  2.21 Dina Rich MD Electronically signed by Dina Rich MD Signature Date/Time: 07/04/2019/3:52:34 PM    Final     Cardiac Studies     Patient Profile     [name] is a 79 y.o. female with a hx of CM,  In 2011 with EF 40-45% and on last echo 2018 50-55%, CAD with non obst disease of 60% in LAD in 2002, LBBB, Persistent AFl with rate control,, HTN, CHF HLD who is being seen today for the evaluation of chest pain and a fib RVR at the request of Dr. Gwenlyn Perking.  Assessment & Plan    1. Praroxysmal afib/aflutter - History of paroxysmal aflutter/afib, she is on eliquis at home. Started on dilt gtt here for afib with RVR.  - home toprol increased to 25mg  daily, off dilt gtt. Eliquis on hold and started on hep gtt in case invasive testing required.   - rates remain above goal, increase toprol to 50mg  daily.  - d/c heparin, restart her home eliquis. Do not plan on invasive ischemic testing this admission.      2. Chest pain History of moderate CAD, had a 60-70% mid LAD lesion back in 2002.  EKG chronic LBBB, troponins negative - echo LVEF 50-55%, grade II DDX, anteroseptal wall  hypokinetic(similar to dyssenergy noted on prior study), severe BAE, mod MR, mod TR  Medical therapy with imdur 30, toprol 25 mg, pravastatin 20 mg. No ASA since on eliquis.    2002 cath: LAD prox 30%, 60-70% mid LAD, D2 40%,  08/2015 nuclear stress: cannot exclude mid anteroseptal, apical anterior and apical septal ischemia  - unclear cause of her pain, somewhat atypical as it was worst with deep breathing. She presented in afib with RVR as well as fluid overload, either could explain chest pain and SOB - symptoms have improved with rate control and diuresis, do not plan on ischemic testing at this time.    3. Acute on chronic diastolic HF - an 2003 echo LVEF 50-55%, grade II DDX, mod MR - reports poor compliance with lasix - has had some chronic LE edema since prior hip surgery - does report some icnreased edema, DOE, PND, orthopnea - BNP 255, CXR probable mild edema.   - negative 2.7 L yesterday, she received IV lasix 40mg  x 1 yesterday. No labs back yet this AM.  - redose lasix pending AM labs  - exacerbating factor may have been afib with RVR, as well as poor lasix compliance.   Potential discharge later today pending rates later today on higher toprol and also AM lab results. Would plan on lasix 40mg  daily at home.    For questions or updates, please contact [facility] Please consult [email or web address] for contact info under        Signed, 09/2015, MD  07/05/2019, 8:13 AM

## 2019-07-05 NOTE — Progress Notes (Signed)
ANTICOAGULATION CONSULT NOTE - [place] for heparin IV Indication: atrial fibrillation  Allergies  Allergen Reactions  . Codeine Itching    "feel like going buggy"  Itchy skin  . Tape Itching    Nylon tape makes pt feel itchy    Patient Measurements: Height: 5\' 3"  (160 cm) Weight: 209 lb 14.1 oz (95.2 kg) IBW/kg (Calculated) : 52.4 Heparin Dosing Weight: 75 kg  Vital Signs: Temp: 98 F (36.7 C) (03/24 0500) Temp Source: Oral (03/24 0500) BP: 133/63 (03/24 0600) Pulse Rate: 109 (03/24 0600)  Labs: Recent Labs    07/03/19 1719 07/03/19 1921 07/04/19 0518 07/04/19 1032 07/04/19 2008 07/05/19 0428  HGB 12.1  --   --   --   --  13.3  HCT 38.8  --   --   --   --  42.1  PLT 248  --   --   --   --  262  APTT  --   --   --  35 81* 93*  LABPROT  --   --   --  14.9  --   --   INR  --   --   --  1.2  --   --   HEPARINUNFRC  --   --   --  1.86*  --  1.36*  CREATININE 1.00  --  0.91  --   --   --   TROPONINIHS 4 5  --   --   --   --     Estimated Creatinine Clearance: 57.7 mL/min (by C-G formula based on SCr of 0.91 mg/dL).   Medical History: Past Medical History:  Diagnosis Date  . Anemia   . Cardiomyopathy ([place])    LVEF 40-45% 2011  . CHF (congestive heart failure) ([place])   . Chronic insomnia   . Coronary atherosclerosis of native coronary artery    60% LAD in 2002  . DJD (degenerative joint disease)    Left THA  . Essential hypertension   . GERD (gastroesophageal reflux disease)   . Hyperlipidemia   . Hypothyroidism   . Left bundle branch block   . Nephrolithiasis   . Obesity   . Paroxysmal atrial flutter [facility])    Documented January 2018    Medications:  Medications Prior to Admission  Medication Sig Dispense Refill Last Dose  . Calcium Carbonate-Vitamin D (CALCIUM 600 + D PO) Take 1 tablet by mouth daily.    07/03/2019 at Unknown time  . ELIQUIS 5 MG TABS tablet Take 1 tablet by mouth twice daily 60 tablet 11 07/03/2019 at 0830   . furosemide (LASIX) 40 MG tablet Take 20 mg by mouth daily as needed for fluid or edema.    07/03/2019 at Unknown time  . gabapentin (NEURONTIN) 100 MG capsule Take 100 mg by mouth at bedtime.   07/02/2019 at Unknown time  . isosorbide mononitrate (IMDUR) 30 MG 24 hr tablet Take 1 tablet by mouth every day 30 tablet 11 07/03/2019 at Unknown time  . lisinopril (PRINIVIL,ZESTRIL) 20 MG tablet Take 20 mg by mouth 2 (two) times daily.    07/03/2019 at Unknown time  . metoprolol succinate (TOPROL-XL) 25 MG 24 hr tablet Take 0.5 tablets by mouth every evening (Patient taking differently: Take 12.5 mg by mouth daily. ) 15 tablet 11 07/02/2019 at 1900  . Multiple Vitamin (MULTIVITAMIN WITH MINERALS) TABS Take 1 tablet by mouth daily.   07/03/2019 at Unknown time  . nitroGLYCERIN (NITROSTAT) 0.4  MG SL tablet Place 1 tablet (0.4 mg total) under the tongue every 5 (five) minutes as needed. For chest pain 25 tablet 3 07/03/2019 at Unknown time  . omeprazole (PRILOSEC) 20 MG capsule Take 20 mg by mouth every morning.    07/03/2019 at Unknown time  . pravastatin (PRAVACHOL) 40 MG tablet Take 40 mg by mouth at bedtime.    07/02/2019 at Unknown time  . tamsulosin (FLOMAX) 0.4 MG CAPS capsule Take 1 capsule (0.4 mg total) by mouth daily. 30 capsule 0 07/03/2019 at Unknown time    Assessment: 79 yo F on Eliquis prior to admission for afib.  Pharmacy has been consulted to dose heparin pending need for procedure.  Last dose of Eliquis given 3/22 0830 so will need to monitor based on aPTT until correlation with heparin levels.    APTT 93- therapeutic HL 1.36: still elevated due to apixaban  Goal of Therapy:  Heparin level 0.3-0.7 units/ml aPTT 66-102 seconds Monitor platelets by anticoagulation protocol: Yes   Plan:  Continue heparin infusion at 1100 units/hr Daily heparin level and and aPTT while on heparin Continue to monitor H&H and platelets  Judeth Cornfield, PharmD Clinical Pharmacist 07/05/2019 7:37 AM

## 2019-07-06 DIAGNOSIS — I4891 Unspecified atrial fibrillation: Secondary | ICD-10-CM

## 2019-07-06 LAB — CBC
HCT: 38.9 % (ref 36.0–46.0)
Hemoglobin: 12.3 g/dL (ref 12.0–15.0)
MCH: 30.3 pg (ref 26.0–34.0)
MCHC: 31.6 g/dL (ref 30.0–36.0)
MCV: 95.8 fL (ref 80.0–100.0)
Platelets: 258 10*3/uL (ref 150–400)
RBC: 4.06 MIL/uL (ref 3.87–5.11)
RDW: 13.6 % (ref 11.5–15.5)
WBC: 7.2 10*3/uL (ref 4.0–10.5)
nRBC: 0 % (ref 0.0–0.2)

## 2019-07-06 LAB — BASIC METABOLIC PANEL
Anion gap: 8 (ref 5–15)
BUN: 22 mg/dL (ref 8–23)
CO2: 26 mmol/L (ref 22–32)
Calcium: 9.4 mg/dL (ref 8.9–10.3)
Chloride: 107 mmol/L (ref 98–111)
Creatinine, Ser: 1.11 mg/dL — ABNORMAL HIGH (ref 0.44–1.00)
GFR calc Af Amer: 56 mL/min — ABNORMAL LOW (ref >60)
GFR calc non Af Amer: 48 mL/min — ABNORMAL LOW (ref >60)
Glucose, Bld: 104 mg/dL — ABNORMAL HIGH (ref 70–99)
Potassium: 4 mmol/L (ref 3.5–5.1)
Sodium: 141 mmol/L (ref 135–145)

## 2019-07-06 LAB — MAGNESIUM: Magnesium: 1.9 mg/dL (ref 1.7–2.4)

## 2019-07-06 MED ORDER — METOPROLOL SUCCINATE ER 50 MG PO TB24: 100.0000 mg | ORAL_TABLET | Freq: Every day | ORAL | Status: DC

## 2019-07-06 MED ORDER — METOPROLOL SUCCINATE ER 50 MG PO TB24
50.0000 mg | ORAL_TABLET | Freq: Once | ORAL | Status: AC
  Administered 2019-07-06: 10:00:00 50 mg via ORAL
  Filled 2019-07-06: qty 1

## 2019-07-06 MED ORDER — METOPROLOL SUCCINATE ER 100 MG PO TB24: 100.0000 mg | ORAL_TABLET | Freq: Every day | ORAL | 1 refills | Status: DC

## 2019-07-06 MED ORDER — FUROSEMIDE 40 MG PO TABS: 40.0000 mg | ORAL_TABLET | Freq: Every day | ORAL | 1 refills | Status: DC

## 2019-07-06 MED ORDER — FUROSEMIDE 40 MG PO TABS: 40.0000 mg | ORAL_TABLET | Freq: Every day | ORAL | Status: DC

## 2019-07-06 NOTE — Progress Notes (Addendum)
Progress Note  Patient Name: [name] Date of Encounter: 07/06/2019  Primary Cardiologist: [name], MD   Subjective   No complaints  Inpatient Medications    Scheduled Meds: . apixaban  5 mg Oral BID  . Chlorhexidine Gluconate Cloth  6 each Topical Daily  . gabapentin  100 mg Oral QHS  . isosorbide mononitrate  30 mg Oral Daily  . lisinopril  20 mg Oral BID  . metoprolol succinate  50 mg Oral Daily  . pantoprazole  40 mg Oral Daily  . pravastatin  40 mg Oral QHS  . tamsulosin  0.4 mg Oral Daily   Continuous Infusions:  PRN Meds: nitroGLYCERIN   Vital Signs    Vitals:   07/06/19 0600 07/06/19 0700 07/06/19 0800 07/06/19 0900  BP:  120/66 117/73 124/78  Pulse: 93 72 84 90  Resp: 18 17 18  (!) 25  Temp:   98.1 F (36.7 C)   TempSrc:   Oral   SpO2: 98% 94% 97% 97%  Weight:      Height:        Intake/Output Summary (Last 24 hours) at 07/06/2019 0915 Last data filed at 07/06/2019 0800 Gross per 24 hour  Intake 29.35 ml  Output 1550 ml  Net -1520.65 ml   Last 3 Weights 07/05/2019 07/04/2019 07/03/2019  Weight (lbs) 209 lb 14.1 oz 216 lb 4.3 oz 220 lb  Weight (kg) 95.2 kg 98.1 kg 99.791 kg      Telemetry    afib variable rates - Personally Reviewed  ECG    n/a - Personally Reviewed  Physical Exam   GEN: No acute distress.   Neck: No JVD Cardiac: irreg, no murmurs, rubs, or gallops.  Respiratory: Clear to auscultation bilaterally. GI: Soft, nontender, non-distended  MS: No edema; No deformity. Neuro:  Nonfocal  Psych: Normal affect   Labs    High Sensitivity Troponin:   Recent Labs  Lab 07/03/19 1719 07/03/19 1921  TROPONINIHS 4 5      Chemistry Recent Labs  Lab 07/04/19 0518 07/05/19 0838 07/06/19 0523  NA 142 140 141  K 3.7 4.0 4.0  CL 108 106 107  CO2 26 24 26   GLUCOSE 102* 101* 104*  BUN 14 17 22   CREATININE 0.91 0.90 1.11*  CALCIUM 9.1 9.3 9.4  GFRNONAA >60 >60 48*  GFRAA >60 >60 56*  ANIONGAP 8 10 8       Hematology Recent Labs  Lab 07/03/19 1719 07/05/19 0428 07/06/19 0523  WBC 6.8 6.7 7.2  RBC 4.03 4.40 4.06  HGB 12.1 13.3 12.3  HCT 38.8 42.1 38.9  MCV 96.3 95.7 95.8  MCH 30.0 30.2 30.3  MCHC 31.2 31.6 31.6  RDW 14.0 13.8 13.6  PLT 248 262 258    BNP Recent Labs  Lab 07/03/19 1719  BNP 255.0*     DDimer  Recent Labs  Lab 07/03/19 1719  DDIMER 0.30     Radiology    ECHOCARDIOGRAM COMPLETE  Result Date: 07/04/2019    ECHOCARDIOGRAM REPORT   Patient Name:   KEONNA RAETHER [facility] Date of Exam: 07/04/2019 Medical Rec #:  07/06/2019         Height:       63.0 in Accession #:    [id number] Blend        Weight:       216.3 lb Date of Birth:  1941-01-31         BSA:  1.999 m Patient Age:    78 years          BP:           110/80 mmHg Patient Gender: F                 HR:           92 bpm. [facility] Procedure: 2D Echo Indications:    Atrial Fibrillation 427.31 / I48.91  History:        Patient has prior history of Echocardiogram examinations, most                 recent 04/21/2016. CAD, Arrythmias:Atrial Fibrillation; Risk                 Factors:Hypertension, Dyslipidemia and Non-Smoker. PSVT.  Sonographer:    Jeryl Columbia RDCS (AE) Referring Phys: [id number] DAVID MANUEL ORTIZ IMPRESSIONS  1. The anteroseptal wall is hypokinetic. Marland Kitchen Left ventricular ejection fraction, by estimation, is 50 to 55%. The left ventricle has low normal function. The left ventricle demonstrates regional wall motion abnormalities (see scoring diagram/findings for description). Left ventricular diastolic parameters are consistent with Grade II diastolic dysfunction (pseudonormalization). Elevated left atrial pressure.  2. Right ventricular systolic function is low normal. The right ventricular size is normal. There is moderately elevated pulmonary artery systolic pressure.  3. Left atrial size was severely dilated.  4. Right atrial size was severely dilated.  5. The mitral valve is abnormal. Moderate  mitral valve regurgitation. No evidence of mitral stenosis.  6. Tricuspid valve regurgitation is moderate.  7. The aortic valve is tricuspid. Aortic valve regurgitation is not visualized. No aortic stenosis is present.  8. Mild pulmonary HTN, PASP is 37 mmHg.  9. The inferior vena cava is normal in size with greater than 50% respiratory variability, suggesting right atrial pressure of 3 mmHg. FINDINGS  Left Ventricle: The anteroseptal wall is hypokinetic. Left ventricular ejection fraction, by estimation, is 50 to 55%. The left ventricle has low normal function. The left ventricle demonstrates regional wall motion abnormalities. The left ventricular internal cavity size was normal in size. There is no left ventricular hypertrophy. Left ventricular diastolic parameters are consistent with Grade II diastolic dysfunction (pseudonormalization). Elevated left atrial pressure. Right Ventricle: The right ventricular size is normal. No increase in right ventricular wall thickness. Right ventricular systolic function is low normal. There is moderately elevated pulmonary artery systolic pressure. The tricuspid regurgitant velocity  is 3.04 m/s, and with an assumed right atrial pressure of 10 mmHg, the estimated right ventricular systolic pressure is 47.0 mmHg. Left Atrium: Left atrial size was severely dilated. Right Atrium: Right atrial size was severely dilated. Pericardium: Trivial pericardial effusion is present. The pericardial effusion is circumferential. There is no evidence of cardiac tamponade. Mitral Valve: The mitral valve is abnormal. There is mild thickening of the mitral valve leaflet(s). There is mild calcification of the mitral valve leaflet(s). Mild mitral annular calcification. Moderate mitral valve regurgitation. No evidence of mitral  valve stenosis. Tricuspid Valve: The tricuspid valve is normal in structure. Tricuspid valve regurgitation is moderate . No evidence of tricuspid stenosis. Aortic Valve: The  aortic valve is tricuspid. . There is mild thickening and mild calcification of the aortic valve. Aortic valve regurgitation is not visualized. No aortic stenosis is present. Mild aortic valve annular calcification. There is mild thickening of the aortic valve. There is mild calcification of the aortic valve. Aortic valve mean gradient measures 6.1 mmHg. Aortic  valve peak gradient measures 12.1 mmHg. Aortic valve area, by VTI measures 1.44 cm. Pulmonic Valve: The pulmonic valve was not well visualized. Pulmonic valve regurgitation is not visualized. No evidence of pulmonic stenosis. Aorta: The aortic root is normal in size and structure. Pulmonary Artery: Mild pulmonary HTN, PASP is 37 mmHg. Venous: The inferior vena cava is normal in size with greater than 50% respiratory variability, suggesting right atrial pressure of 3 mmHg. IAS/Shunts: No atrial level shunt detected by color flow Doppler.  LEFT VENTRICLE PLAX 2D LVIDd:         5.21 cm  Diastology LVIDs:         3.50 cm  LV e' lateral:   7.29 cm/s LV PW:         0.90 cm  LV E/e' lateral: 19.5 LV IVS:        0.89 cm  LV e' medial:    7.62 cm/s LVOT diam:     1.90 cm  LV E/e' medial:  18.6 LV SV:         45 LV SV Index:   23 LVOT Area:     2.84 cm  RIGHT VENTRICLE RV S prime:     9.03 cm/s TAPSE (M-mode): 1.4 cm LEFT ATRIUM             Index LA diam:        4.10 cm 2.05 cm/m LA Vol (A2C):   84.8 ml 42.42 ml/m LA Vol (A4C):   85.0 ml 42.52 ml/m LA Biplane Vol: 88.3 ml 44.17 ml/m  AORTIC VALVE AV Area (Vmax):    1.48 cm AV Area (Vmean):   1.55 cm AV Area (VTI):     1.44 cm AV Vmax:           173.74 cm/s AV Vmean:          117.336 cm/s AV VTI:            0.314 m AV Peak Grad:      12.1 mmHg AV Mean Grad:      6.1 mmHg LVOT Vmax:         90.80 cm/s LVOT Vmean:        64.300 cm/s LVOT VTI:          0.159 m LVOT/AV VTI ratio: 0.51  AORTA Ao Root diam: 2.70 cm MITRAL VALVE                 TRICUSPID VALVE MV Area (PHT): 4.54 cm      TR Peak grad:   37.0 mmHg MV  VTI:        0.29 m        TR Vmax:        304.00 cm/s MV Decel Time: 167 msec MR Peak grad:    135.0 mmHg  SHUNTS MR Mean grad:    78.0 mmHg   Systemic VTI:  0.16 m MR Vmax:         581.00 cm/s Systemic Diam: 1.90 cm MR Vmean:        409.0 cm/s MR PISA:         1.57 cm MR PISA Eff ROA: 8 mm MR PISA Radius:  0.50 cm MV E velocity: 142.00 cm/s MV A velocity: 64.30 cm/s MV E/A ratio:  2.21 Dina Rich MD Electronically signed by Dina Rich MD Signature Date/Time: 07/04/2019/3:52:34 PM    Final     Cardiac Studies     Patient Profile     Barth Kirks  I McMillanis a 79 y.o.femalewith a hx of CM, In 2011 with EF 40-45% and on last echo 2018 50-55%, CAD with non obst disease of 60% in LAD in 2002, LBBB, Persistent AFl with rate control,, HTN, CHF HLDwho is being seen today for the evaluation of chest pain and a fib RVRat the request of Dr. Dyann Kief.  Assessment & Plan    1. Praroxysmal afib/aflutter - History of paroxysmal aflutter/afib, she is on eliquis at home. Started on dilt gtt here for afib with RVR. - yesterday increased toprol to 50mg  daily due to elevated HRs - back on eliquis, had transiently been on heparin in case invasive procedures were indicated.   - rates still elevated at times, increase toprol to 100mg  daiy.      2. Chest pain History of moderate CAD, had a 60-70% mid LAD lesion back in 2002.  EKG chronic LBBB, troponins negative - echo LVEF 50-55%, grade II DDX, anteroseptal wall hypokinetic(similar to dyssenergy noted on prior study), severe BAE, mod MR, mod TR  Medical therapy with imdur 30, toprol 50 mg, pravastatin 20 mg. No ASA since on eliquis.    2002 cath: LAD prox 30%, 60-70% mid LAD, D2 40%,  08/2015 nuclear stress: cannot excludemid anteroseptal, apical anterior and apical septalischemia  - unclear cause of her pain, somewhat atypical as it was worst with deep breathing. She presented in afib with RVR as well as fluid overload, either could  explain chest pain and SOB - symptoms have improved with rate control and diuresis, do not plan on ischemic testing at this time.    3. Acute on chronic diastolic HF - an 7353 echo LVEF 50-55%, grade II DDX, mod MR - reports poor compliance with lasix - has had some chronic LE edema since prior hip surgery - does report some icnreased edema, DOE, PND, orthopnea - BNP 255, CXR probable mild edema.   - negative 1.3 L yesterday, neg 4.4 L since admission. Actually did not receive any diuretic yesterday. Uptrend in Cr today, would hold diuretics again today, start lasix 40mg  tomorrow.  - exacerbating factor may have been afib with RVR, as well as poor lasix compliance.  [place] for discharge from cardiac standpoint, we will arrange outpatient f/u 2 weeks Will need bmet at that time. We will sign off inpatient care    For questions or updates, please contact [place] Please consult [email or web address] for contact info under        Signed, Carlyle Dolly, MD  07/06/2019, 9:15 AM

## 2019-07-06 NOTE — Discharge Summary (Signed)
Physician Discharge Summary  [name]:[id number] DOB: Sep 30, 1940 DOA: 07/03/2019  PCP: [name], MD  Admit date: 07/03/2019 Discharge date: 07/06/2019  Admitted From: home Disposition:  Home  Recommendations for Outpatient Follow-up:  1. Follow up with PCP in 1-2 weeks 2. Please obtain BMP/CBC in one week  Discharge Condition: Stable CODE STATUS: FULL Diet recommendation: Heart Healthy    Brief/Interim Summary: 79 year old female with a history of persistent atrial flutter on apixaban, and ICM, hypertension, nonobstructive CAD, left bundle branch block, hyperlipidemia, hypothyroidism presented with 2 day history of shortness of breath and chest pain.  She contacted her cardiology office, and the patient was instructed to go to the emergency department.  The patient also endorsed PND and 2 pillows at night to sleep.  She complained of some worsening lower extremity edema.  In the ED, the patient was noted to have atrial flutter with heart rate in the 120s.  She was started on a diltiazem drip.  Chest x-ray showed vascular congestion.  She was started on IV furosemide with good clinical effect.  She was transitioned to po lasix.  She was started on metoprol succinate--increased to 100 mg daily.  Discharge Diagnoses:   Paroxysmal atrial fibrillation/atrial flutter -Appreciate cardiology follow-up -Initially on diltiazem and IV heparin -Metoprolol succinate increased to 100 mg daily -Continue apixaban -now rate controlled  Acute on chronic diastolic CHF -Patient received 2 doses of IV furosemide -Reports poor compliance with furosemide at home -07/04/2019 echo EF 50-55%, grade 2 DD, moderate MR/TR -Transition to oral furosemide 40 mg po daily -continue lisinopril -d/c weight 209.8 lbs  Chest pain -Troponins unremarkable -History of moderate CAD, had a 60-70% mid LAD lesion back in 2002.  EKG chronic LBBB, troponins negative Medical therapy with imdur 30, toprol  12.5, pravastatin 20. -No plans for ischemic testing per cardiology -continue imdur  Hyperlipidemia -continue statin  Essential Hypertension -continue metoprolol succinate--dose increased to  daily   Discharge Instructions   Allergies as of 07/06/2019      Reactions   Codeine Itching   "feel like going buggy"  Itchy skin   Tape Itching   Nylon tape makes pt feel itchy      Medication List    TAKE these medications   CALCIUM 600 + D PO Take 1 tablet by mouth daily.   Eliquis 5 MG Tabs tablet Generic drug: apixaban Take 1 tablet by mouth twice daily   furosemide 40 MG tablet Commonly known as: LASIX Take 1 tablet (40 mg total) by mouth daily. Start taking on: July 07, 2019 What changed:   how much to take  when to take this  reasons to take this   gabapentin 100 MG capsule Commonly known as: NEURONTIN Take 100 mg by mouth at bedtime.   isosorbide mononitrate 30 MG 24 hr tablet Commonly known as: IMDUR Take 1 tablet by mouth every day   lisinopril 20 MG tablet Commonly known as: ZESTRIL Take 20 mg by mouth 2 (two) times daily.   metoprolol succinate 100 MG 24 hr tablet Commonly known as: TOPROL-XL Take 1 tablet (100 mg total) by mouth daily. Take with or immediately following a meal. Start taking on: July 07, 2019 What changed:   medication strength  See the new instructions.   multivitamin with minerals Tabs tablet Take 1 tablet by mouth daily.   nitroGLYCERIN 0.4 MG SL tablet Commonly known as: NITROSTAT Place 1 tablet (0.4 mg total) under the tongue every 5 (five) minutes as needed.  For chest pain   omeprazole 20 MG capsule Commonly known as: PRILOSEC Take 20 mg by mouth every morning.   pravastatin 40 MG tablet Commonly known as: PRAVACHOL Take 40 mg by mouth at bedtime.   tamsulosin 0.4 MG Caps capsule Commonly known as: FLOMAX Take 1 capsule (0.4 mg total) by mouth daily.       Allergies  Allergen Reactions  .  Codeine Itching    "feel like going buggy"  Itchy skin  . Tape Itching    Nylon tape makes pt feel itchy    Consultations:  cardiology   Procedures/Studies: DG Chest 2 View  Result Date: 07/03/2019 CLINICAL DATA:  79 year old female with chest pain. EXAM: CHEST - 2 VIEW COMPARISON:  Chest radiograph dated 04/20/2016. FINDINGS: There is cardiomegaly with mild vascular congestion and probable mild edema. No focal consolidation, pleural effusion or pneumothorax. There is atherosclerotic calcification of the aorta. No acute osseous pathology. IMPRESSION: Cardiomegaly with findings of CHF. Electronically Signed   By: Elgie Collard M.D.   On: 07/03/2019 18:46   ECHOCARDIOGRAM COMPLETE  Result Date: 07/04/2019    ECHOCARDIOGRAM REPORT   Patient Name:   [name] [facility] Date of Exam: 07/04/2019 Medical Rec #:  [id number]         Height:       63.0 in Accession #:    [id number]        Weight:       216.3 lb Date of Birth:  1940/07/02         BSA:          1.999 m Patient Age:    78 years          BP:           110/80 mmHg Patient Gender: F                 HR:           92 bpm. [facility] Procedure: 2D Echo Indications:    Atrial Fibrillation 427.31 / I48.91  History:        Patient has prior history of Echocardiogram examinations, most                 recent 04/21/2016. CAD, Arrythmias:Atrial Fibrillation; Risk                 Factors:Hypertension, Dyslipidemia and Non-Smoker. PSVT.  Sonographer:    Jeryl Columbia RDCS (AE) Referring Phys: [id number] Charlee Squibb MANUEL ORTIZ IMPRESSIONS  1. The anteroseptal wall is hypokinetic. Marland Kitchen Left ventricular ejection fraction, by estimation, is 50 to 55%. The left ventricle has low normal function. The left ventricle demonstrates regional wall motion abnormalities (see scoring diagram/findings for description). Left ventricular diastolic parameters are consistent with Grade II diastolic dysfunction (pseudonormalization). Elevated left atrial pressure.  2. Right  ventricular systolic function is low normal. The right ventricular size is normal. There is moderately elevated pulmonary artery systolic pressure.  3. Left atrial size was severely dilated.  4. Right atrial size was severely dilated.  5. The mitral valve is abnormal. Moderate mitral valve regurgitation. No evidence of mitral stenosis.  6. Tricuspid valve regurgitation is moderate.  7. The aortic valve is tricuspid. Aortic valve regurgitation is not visualized. No aortic stenosis is present.  8. Mild pulmonary HTN, PASP is 37 mmHg.  9. The inferior vena cava is normal in size with greater than 50% respiratory variability, suggesting right atrial pressure of 3 mmHg. FINDINGS  Left  Ventricle: The anteroseptal wall is hypokinetic. Left ventricular ejection fraction, by estimation, is 50 to 55%. The left ventricle has low normal function. The left ventricle demonstrates regional wall motion abnormalities. The left ventricular internal cavity size was normal in size. There is no left ventricular hypertrophy. Left ventricular diastolic parameters are consistent with Grade II diastolic dysfunction (pseudonormalization). Elevated left atrial pressure. Right Ventricle: The right ventricular size is normal. No increase in right ventricular wall thickness. Right ventricular systolic function is low normal. There is moderately elevated pulmonary artery systolic pressure. The tricuspid regurgitant velocity  is 3.04 m/s, and with an assumed right atrial pressure of 10 mmHg, the estimated right ventricular systolic pressure is 47.0 mmHg. Left Atrium: Left atrial size was severely dilated. Right Atrium: Right atrial size was severely dilated. Pericardium: Trivial pericardial effusion is present. The pericardial effusion is circumferential. There is no evidence of cardiac tamponade. Mitral Valve: The mitral valve is abnormal. There is mild thickening of the mitral valve leaflet(s). There is mild calcification of the mitral valve  leaflet(s). Mild mitral annular calcification. Moderate mitral valve regurgitation. No evidence of mitral  valve stenosis. Tricuspid Valve: The tricuspid valve is normal in structure. Tricuspid valve regurgitation is moderate . No evidence of tricuspid stenosis. Aortic Valve: The aortic valve is tricuspid. . There is mild thickening and mild calcification of the aortic valve. Aortic valve regurgitation is not visualized. No aortic stenosis is present. Mild aortic valve annular calcification. There is mild thickening of the aortic valve. There is mild calcification of the aortic valve. Aortic valve mean gradient measures 6.1 mmHg. Aortic valve peak gradient measures 12.1 mmHg. Aortic valve area, by VTI measures 1.44 cm. Pulmonic Valve: The pulmonic valve was not well visualized. Pulmonic valve regurgitation is not visualized. No evidence of pulmonic stenosis. Aorta: The aortic root is normal in size and structure. Pulmonary Artery: Mild pulmonary HTN, PASP is 37 mmHg. Venous: The inferior vena cava is normal in size with greater than 50% respiratory variability, suggesting right atrial pressure of 3 mmHg. IAS/Shunts: No atrial level shunt detected by color flow Doppler.  LEFT VENTRICLE PLAX 2D LVIDd:         5.21 cm  Diastology LVIDs:         3.50 cm  LV e' lateral:   7.29 cm/s LV PW:         0.90 cm  LV E/e' lateral: 19.5 LV IVS:        0.89 cm  LV e' medial:    7.62 cm/s LVOT diam:     1.90 cm  LV E/e' medial:  18.6 LV SV:         45 LV SV Index:   23 LVOT Area:     2.84 cm  RIGHT VENTRICLE RV S prime:     9.03 cm/s TAPSE (M-mode): 1.4 cm LEFT ATRIUM             Index LA diam:        4.10 cm 2.05 cm/m LA Vol (A2C):   84.8 ml 42.42 ml/m LA Vol (A4C):   85.0 ml 42.52 ml/m LA Biplane Vol: 88.3 ml 44.17 ml/m  AORTIC VALVE AV Area (Vmax):    1.48 cm AV Area (Vmean):   1.55 cm AV Area (VTI):     1.44 cm AV Vmax:           173.74 cm/s AV Vmean:          117.336 cm/s AV VTI:  0.314 m AV Peak Grad:       12.1 mmHg AV Mean Grad:      6.1 mmHg LVOT Vmax:         90.80 cm/s LVOT Vmean:        64.300 cm/s LVOT VTI:          0.159 m LVOT/AV VTI ratio: 0.51  AORTA Ao Root diam: 2.70 cm MITRAL VALVE                 TRICUSPID VALVE MV Area (PHT): 4.54 cm      TR Peak grad:   37.0 mmHg MV VTI:        0.29 m        TR Vmax:        304.00 cm/s MV Decel Time: 167 msec MR Peak grad:    135.0 mmHg  SHUNTS MR Mean grad:    78.0 mmHg   Systemic VTI:  0.16 m MR Vmax:         581.00 cm/s Systemic Diam: 1.90 cm MR Vmean:        409.0 cm/s MR PISA:         1.57 cm MR PISA Eff ROA: 8 mm MR PISA Radius:  0.50 cm MV E velocity: 142.00 cm/s MV A velocity: 64.30 cm/s MV E/A ratio:  2.21 Dina Rich MD Electronically signed by Dina Rich MD Signature Date/Time: 07/04/2019/3:52:34 PM    Final          Discharge Exam: Vitals:   07/06/19 0800 07/06/19 0900  BP: 117/73 124/78  Pulse: 84 90  Resp: 18 (!) 25  Temp: 98.1 F (36.7 C)   SpO2: 97% 97%   Vitals:   07/06/19 0600 07/06/19 0700 07/06/19 0800 07/06/19 0900  BP:  120/66 117/73 124/78  Pulse: 93 72 84 90  Resp: 18 17 18  (!) 25  Temp:   98.1 F (36.7 C)   TempSrc:   Oral   SpO2: 98% 94% 97% 97%  Weight:      Height:        General: Pt is alert, awake, not in acute distress Cardiovascular: IRRR, S1/S2 +, no rubs, no gallops Respiratory: fine bibasilar crackles. No wheeze Abdominal: Soft, NT, ND, bowel sounds + Extremities: no edema, no cyanosis   The results of significant diagnostics from this hospitalization (including imaging, microbiology, ancillary and laboratory) are listed below for reference.    Significant Diagnostic Studies: DG Chest 2 View  Result Date: 07/03/2019 CLINICAL DATA:  79 year old female with chest pain. EXAM: CHEST - 2 VIEW COMPARISON:  Chest radiograph dated 04/20/2016. FINDINGS: There is cardiomegaly with mild vascular congestion and probable mild edema. No focal consolidation, pleural effusion or pneumothorax.  There is atherosclerotic calcification of the aorta. No acute osseous pathology. IMPRESSION: Cardiomegaly with findings of CHF. Electronically Signed   By: Elgie Collard M.D.   On: 07/03/2019 18:46   ECHOCARDIOGRAM COMPLETE  Result Date: 07/04/2019    ECHOCARDIOGRAM REPORT   Patient Name:   [name] [facility] Date of Exam: 07/04/2019 Medical Rec #:  [id number]         Height:       63.0 in Accession #:    [id number]        Weight:       216.3 lb Date of Birth:  Mar 04, 1941         BSA:          1.999 m Patient Age:    89  years          BP:           110/80 mmHg Patient Gender: F                 HR:           92 bpm. [facility] Procedure: 2D Echo Indications:    Atrial Fibrillation 427.31 / I48.91  History:        Patient has prior history of Echocardiogram examinations, most                 recent 04/21/2016. CAD, Arrythmias:Atrial Fibrillation; Risk                 Factors:Hypertension, Dyslipidemia and Non-Smoker. PSVT.  Sonographer:    Jeryl Columbia RDCS (AE) Referring Phys: [id number] Braelin Costlow MANUEL ORTIZ IMPRESSIONS  1. The anteroseptal wall is hypokinetic. Marland Kitchen Left ventricular ejection fraction, by estimation, is 50 to 55%. The left ventricle has low normal function. The left ventricle demonstrates regional wall motion abnormalities (see scoring diagram/findings for description). Left ventricular diastolic parameters are consistent with Grade II diastolic dysfunction (pseudonormalization). Elevated left atrial pressure.  2. Right ventricular systolic function is low normal. The right ventricular size is normal. There is moderately elevated pulmonary artery systolic pressure.  3. Left atrial size was severely dilated.  4. Right atrial size was severely dilated.  5. The mitral valve is abnormal. Moderate mitral valve regurgitation. No evidence of mitral stenosis.  6. Tricuspid valve regurgitation is moderate.  7. The aortic valve is tricuspid. Aortic valve regurgitation is not visualized. No aortic  stenosis is present.  8. Mild pulmonary HTN, PASP is 37 mmHg.  9. The inferior vena cava is normal in size with greater than 50% respiratory variability, suggesting right atrial pressure of 3 mmHg. FINDINGS  Left Ventricle: The anteroseptal wall is hypokinetic. Left ventricular ejection fraction, by estimation, is 50 to 55%. The left ventricle has low normal function. The left ventricle demonstrates regional wall motion abnormalities. The left ventricular internal cavity size was normal in size. There is no left ventricular hypertrophy. Left ventricular diastolic parameters are consistent with Grade II diastolic dysfunction (pseudonormalization). Elevated left atrial pressure. Right Ventricle: The right ventricular size is normal. No increase in right ventricular wall thickness. Right ventricular systolic function is low normal. There is moderately elevated pulmonary artery systolic pressure. The tricuspid regurgitant velocity  is 3.04 m/s, and with an assumed right atrial pressure of 10 mmHg, the estimated right ventricular systolic pressure is 47.0 mmHg. Left Atrium: Left atrial size was severely dilated. Right Atrium: Right atrial size was severely dilated. Pericardium: Trivial pericardial effusion is present. The pericardial effusion is circumferential. There is no evidence of cardiac tamponade. Mitral Valve: The mitral valve is abnormal. There is mild thickening of the mitral valve leaflet(s). There is mild calcification of the mitral valve leaflet(s). Mild mitral annular calcification. Moderate mitral valve regurgitation. No evidence of mitral  valve stenosis. Tricuspid Valve: The tricuspid valve is normal in structure. Tricuspid valve regurgitation is moderate . No evidence of tricuspid stenosis. Aortic Valve: The aortic valve is tricuspid. . There is mild thickening and mild calcification of the aortic valve. Aortic valve regurgitation is not visualized. No aortic stenosis is present. Mild aortic valve  annular calcification. There is mild thickening of the aortic valve. There is mild calcification of the aortic valve. Aortic valve mean gradient measures 6.1 mmHg. Aortic valve peak gradient measures 12.1 mmHg. Aortic valve  area, by VTI measures 1.44 cm. Pulmonic Valve: The pulmonic valve was not well visualized. Pulmonic valve regurgitation is not visualized. No evidence of pulmonic stenosis. Aorta: The aortic root is normal in size and structure. Pulmonary Artery: Mild pulmonary HTN, PASP is 37 mmHg. Venous: The inferior vena cava is normal in size with greater than 50% respiratory variability, suggesting right atrial pressure of 3 mmHg. IAS/Shunts: No atrial level shunt detected by color flow Doppler.  LEFT VENTRICLE PLAX 2D LVIDd:         5.21 cm  Diastology LVIDs:         3.50 cm  LV e' lateral:   7.29 cm/s LV PW:         0.90 cm  LV E/e' lateral: 19.5 LV IVS:        0.89 cm  LV e' medial:    7.62 cm/s LVOT diam:     1.90 cm  LV E/e' medial:  18.6 LV SV:         45 LV SV Index:   23 LVOT Area:     2.84 cm  RIGHT VENTRICLE RV S prime:     9.03 cm/s TAPSE (M-mode): 1.4 cm LEFT ATRIUM             Index LA diam:        4.10 cm 2.05 cm/m LA Vol (A2C):   84.8 ml 42.42 ml/m LA Vol (A4C):   85.0 ml 42.52 ml/m LA Biplane Vol: 88.3 ml 44.17 ml/m  AORTIC VALVE AV Area (Vmax):    1.48 cm AV Area (Vmean):   1.55 cm AV Area (VTI):     1.44 cm AV Vmax:           173.74 cm/s AV Vmean:          117.336 cm/s AV VTI:            0.314 m AV Peak Grad:      12.1 mmHg AV Mean Grad:      6.1 mmHg LVOT Vmax:         90.80 cm/s LVOT Vmean:        64.300 cm/s LVOT VTI:          0.159 m LVOT/AV VTI ratio: 0.51  AORTA Ao Root diam: 2.70 cm MITRAL VALVE                 TRICUSPID VALVE MV Area (PHT): 4.54 cm      TR Peak grad:   37.0 mmHg MV VTI:        0.29 m        TR Vmax:        304.00 cm/s MV Decel Time: 167 msec MR Peak grad:    135.0 mmHg  SHUNTS MR Mean grad:    78.0 mmHg   Systemic VTI:  0.16 m MR Vmax:         581.00 cm/s  Systemic Diam: 1.90 cm MR Vmean:        409.0 cm/s MR PISA:         1.57 cm MR PISA Eff ROA: 8 mm MR PISA Radius:  0.50 cm MV E velocity: 142.00 cm/s MV A velocity: 64.30 cm/s MV E/A ratio:  2.21 Carlyle Dolly MD Electronically signed by Carlyle Dolly MD Signature Date/Time: 07/04/2019/3:52:34 PM    Final      Microbiology: Recent Results (from the past 240 hour(s))  MRSA PCR Screening     Status: None  Collection Time: 07/04/19  1:54 AM   Specimen: Nasal Mucosa; Nasopharyngeal  Result Value Ref Range Status   MRSA by PCR NEGATIVE NEGATIVE Final    Comment:        The GeneXpert MRSA Assay (FDA approved for NASAL specimens only), is one component of a comprehensive MRSA colonization surveillance program. It is not intended to diagnose MRSA infection nor to guide or monitor treatment for MRSA infections. Performed at [facility], [address] [place], [place] [address]   Respiratory Panel by RT PCR (Flu A&B, Covid) - Nasopharyngeal Swab     Status: None   Collection Time: 07/04/19  7:41 AM   Specimen: Nasopharyngeal Swab  Result Value Ref Range Status   SARS Coronavirus 2 by RT PCR NEGATIVE NEGATIVE Final    Comment: (NOTE) SARS-CoV-2 target nucleic acids are NOT DETECTED. The SARS-CoV-2 RNA is generally detectable in upper respiratoy specimens during the acute phase of infection. The lowest concentration of SARS-CoV-2 viral copies this assay can detect is 131 copies/mL. A negative result does not preclude SARS-Cov-2 infection and should not be used as the sole basis for treatment or other patient management decisions. A negative result may occur with  improper specimen collection/handling, submission of specimen other than nasopharyngeal swab, presence of viral mutation(s) within the areas targeted by this assay, and inadequate number of viral copies (<131 copies/mL). A negative result must be combined with clinical observations, patient history, and epidemiological  information. The expected result is Negative. Fact Sheet for Patients:  https://www.moore.com/ Fact Sheet for Healthcare Providers:  https://www.young.biz/ This test is not yet ap proved or cleared by the [place] FDA and  has been authorized for detection and/or diagnosis of SARS-CoV-2 by FDA under an Emergency Use Authorization (EUA). This EUA will remain  in effect (meaning this test can be used) for the duration of the COVID-19 declaration under Section 564(b)(1) of the Act, 21 U.S.C. section 360bbb-3(b)(1), unless the authorization is terminated or revoked sooner.    Influenza A by PCR NEGATIVE NEGATIVE Final   Influenza B by PCR NEGATIVE NEGATIVE Final    Comment: (NOTE) The Xpert Xpress SARS-CoV-2/FLU/RSV assay is intended as an aid in  the diagnosis of influenza from Nasopharyngeal swab specimens and  should not be used as a sole basis for treatment. Nasal washings and  aspirates are unacceptable for Xpert Xpress SARS-CoV-2/FLU/RSV  testing. Fact Sheet for Patients: https://www.moore.com/ Fact Sheet for Healthcare Providers: https://www.young.biz/ This test is not yet approved or cleared by the [place] FDA and  has been authorized for detection and/or diagnosis of SARS-CoV-2 by  FDA under an Emergency Use Authorization (EUA). This EUA will remain  in effect (meaning this test can be used) for the duration of the  Covid-19 declaration under Section 564(b)(1) of the Act, 21  U.S.C. section 360bbb-3(b)(1), unless the authorization is  terminated or revoked. Performed at [facility], [address], [place], [place] [address]      Labs: Basic Metabolic Panel: Recent Labs  Lab 07/03/19 1719 07/03/19 1719 07/03/19 1921 07/04/19 0518 07/04/19 0518 07/05/19 0838 07/06/19 0523  NA 141  --   --  142  --  140 141  K 3.7   < >  --  3.7   < > 4.0 4.0  CL 110  --   --  108  --  106 107    CO2 23  --   --  26  --  24 26  GLUCOSE 121*  --   --  102*  --  101* 104*  BUN 16  --   --  14  --  17 22  CREATININE 1.00  --   --  0.91  --  0.90 1.11*  CALCIUM 8.8*  --   --  9.1  --  9.3 9.4  MG  --   --  1.8  --   --   --  1.9  PHOS  --   --  3.0  --   --   --   --    < > = values in this interval not displayed.   Liver Function Tests: No results for input(s): AST, ALT, ALKPHOS, BILITOT, PROT, ALBUMIN in the last 168 hours. No results for input(s): LIPASE, AMYLASE in the last 168 hours. No results for input(s): AMMONIA in the last 168 hours. CBC: Recent Labs  Lab 07/03/19 1719 07/05/19 0428 07/06/19 0523  WBC 6.8 6.7 7.2  HGB 12.1 13.3 12.3  HCT 38.8 42.1 38.9  MCV 96.3 95.7 95.8  PLT 248 262 258   Cardiac Enzymes: No results for input(s): CKTOTAL, CKMB, CKMBINDEX, TROPONINI in the last 168 hours. BNP: Invalid input(s): POCBNP CBG: No results for input(s): GLUCAP in the last 168 hours.  Time coordinating discharge:  36 minutes  Signed:  Catarina Hartshorn, DO [facility] Pager: [phone] 07/06/2019, 5:56 PM

## 2019-07-06 NOTE — Progress Notes (Signed)
Pt's IV removed as well as monitoring equipment. Reviewed d/c instructions, follow up care, and medication changes. Pt denies any questions or concerns at this time. All pt belongings accounted for at this time. Awaiting transportation home by family friend.

## 2019-07-12 ENCOUNTER — Other Ambulatory Visit: Payer: Self-pay | Admitting: *Deleted

## 2019-07-12 ENCOUNTER — Other Ambulatory Visit: Payer: Self-pay

## 2019-07-12 ENCOUNTER — Ambulatory Visit (INDEPENDENT_AMBULATORY_CARE_PROVIDER_SITE_OTHER): Payer: Medicare Other | Admitting: Cardiology

## 2019-07-12 ENCOUNTER — Encounter: Payer: Self-pay | Admitting: Cardiology

## 2019-07-12 VITALS — BP 108/64 | HR 100 | Temp 97.1°F | Ht 63.0 in | Wt 207.0 lb

## 2019-07-12 DIAGNOSIS — I484 Atypical atrial flutter: Secondary | ICD-10-CM

## 2019-07-12 DIAGNOSIS — Z8679 Personal history of other diseases of the circulatory system: Secondary | ICD-10-CM

## 2019-07-12 DIAGNOSIS — I25119 Atherosclerotic heart disease of native coronary artery with unspecified angina pectoris: Secondary | ICD-10-CM | POA: Diagnosis not present

## 2019-07-12 NOTE — Progress Notes (Signed)
Cardiology Office Note  Date: 07/12/2019   ID: Timera, Windt 12/18/1940, MRN [id number]  PCP:  [name], MD  Cardiologist:  [name], MD Electrophysiologist:  None   Chief Complaint  Patient presents with  . Cardiac follow-up    History of Present Illness: [name] is a 79 y.o. female last assessed via telehealth encounter in February.  I reviewed interval records.  She was recently hospitalized with recurrent atrial fibrillation associated with RVR.  She was seen in consultation by Dr. Wyline Mood and medications were adjusted.  High-sensitivity troponin I levels were normal.  Follow-up echocardiogram is reviewed below.  She presents today for a follow-up visit.  She does not report any sense of palpitations and has had no chest pain.  Toprol-XL dose was uptitrated and she is tolerating this now 100 mg daily.  She does not report any bleeding problems on Eliquis.  I reviewed her recent lab work.  Past Medical History:  Diagnosis Date  . Anemia   . Cardiomyopathy (HCC)    LVEF 40-45% 2011  . CHF (congestive heart failure) (HCC)   . Chronic insomnia   . Coronary atherosclerosis of native coronary artery    60% LAD in 2002  . DJD (degenerative joint disease)    Left THA  . Essential hypertension   . GERD (gastroesophageal reflux disease)   . Hyperlipidemia   . Hypothyroidism   . Left bundle branch block   . Nephrolithiasis   . Obesity   . Paroxysmal atrial flutter [facility])    Documented January 2018    Past Surgical History:  Procedure Laterality Date  . ABDOMINAL HYSTERECTOMY     (partial)  . APPENDECTOMY    . CHOLECYSTECTOMY    . COLONOSCOPY  Aug 2013   Dr. Loreta Ave, [facility]: small internal hemorrhoids and few scattered sigmoid diverticula, otherwise normal up to TI, TI appeared normal  . ESOPHAGOGASTRODUODENOSCOPY N/A 06/01/2012   RMR:5 cm hiatal hernia. Essentially normal esophagus  . HEMORROIDECTOMY    . JOINT REPLACEMENT      . NOSE SURGERY    . REVISION TOTAL HIP ARTHROPLASTY  2009   Left THA 2009    Current Outpatient Medications  Medication Sig Dispense Refill  . Calcium Carbonate-Vitamin D (CALCIUM 600 + D PO) Take 1 tablet by mouth daily.     Marland Kitchen ELIQUIS 5 MG TABS tablet Take 1 tablet by mouth twice daily 60 tablet 11  . furosemide (LASIX) 40 MG tablet Take 1 tablet (40 mg total) by mouth daily. 30 tablet 1  . gabapentin (NEURONTIN) 100 MG capsule Take 100 mg by mouth at bedtime.    . isosorbide mononitrate (IMDUR) 30 MG 24 hr tablet Take 1 tablet by mouth every day 30 tablet 11  . lisinopril (PRINIVIL,ZESTRIL) 20 MG tablet Take 20 mg by mouth 2 (two) times daily.     . metoprolol succinate (TOPROL-XL) 100 MG 24 hr tablet Take 1 tablet (100 mg total) by mouth daily. Take with or immediately following a meal. 30 tablet 1  . Multiple Vitamin (MULTIVITAMIN WITH MINERALS) TABS Take 1 tablet by mouth daily.    . nitroGLYCERIN (NITROSTAT) 0.4 MG SL tablet Place 1 tablet (0.4 mg total) under the tongue every 5 (five) minutes as needed. For chest pain 25 tablet 3  . omeprazole (PRILOSEC) 20 MG capsule Take 20 mg by mouth every morning.     . pravastatin (PRAVACHOL) 40 MG tablet Take 40 mg by mouth at  bedtime.     . tamsulosin (FLOMAX) 0.4 MG CAPS capsule Take 1 capsule (0.4 mg total) by mouth daily. 30 capsule 0   No current facility-administered medications for this visit.   Allergies:  Codeine and Tape   ROS:   Hearing loss.  Physical Exam: VS:  BP 108/64   Pulse 100   Temp (!) 97.1 F (36.2 C) (Temporal)   Ht 5\' 3"  (1.6 m)   Wt 207 lb (93.9 kg)   SpO2 99%   BMI 36.67 kg/m , BMI Body mass index is 36.67 kg/m.  Wt Readings from Last 3 Encounters:  07/12/19 207 lb (93.9 kg)  07/05/19 209 lb 14.1 oz (95.2 kg)  06/05/19 218 lb (98.9 kg)    General: Patient appears comfortable at rest. HEENT: Conjunctiva and lids normal, wearing a mask. Neck: Supple, no elevated JVP or carotid bruits, no  thyromegaly. Lungs: Clear to auscultation, nonlabored breathing at rest. Cardiac: Irregularly irregular, no S3, 2/6 systolic murmur. Abdomen: Soft, nontender, bowel sounds present. Extremities: Chronic appearing leg edema, distal pulses 2+.  ECG:  An ECG dated 07/03/2019 was personally reviewed today and demonstrated:  Atrial fibrillation with RVR, left bundle branch block.  Recent Labwork: 07/03/2019: B Natriuretic Peptide 255.0 07/04/2019: TSH 3.019 07/06/2019: BUN 22; Creatinine, Ser 1.11; Hemoglobin 12.3; Magnesium 1.9; Platelets 258; Potassium 4.0; Sodium 141     Component Value Date/Time   CHOL 155 06/17/2009 2016   TRIG 97 06/17/2009 2016   HDL 62 06/17/2009 2016   CHOLHDL 2.5 Ratio 06/17/2009 2016   VLDL 19 06/17/2009 2016   LDLCALC 74 06/17/2009 2016    Other Studies Reviewed Today:  Echocardiogram 07/04/2019: 1. The anteroseptal wall is hypokinetic. 07/06/2019 Left ventricular ejection  fraction, by estimation, is 50 to 55%. The left ventricle has low normal  function. The left ventricle demonstrates regional wall motion  abnormalities (see scoring diagram/findings for  description). Left ventricular diastolic parameters are consistent with  Grade II diastolic dysfunction (pseudonormalization). Elevated left atrial  pressure.  2. Right ventricular systolic function is low normal. The right  ventricular size is normal. There is moderately elevated pulmonary artery  systolic pressure.  3. Left atrial size was severely dilated.  4. Right atrial size was severely dilated.  5. The mitral valve is abnormal. Moderate mitral valve regurgitation. No  evidence of mitral stenosis.  6. Tricuspid valve regurgitation is moderate.  7. The aortic valve is tricuspid. Aortic valve regurgitation is not  visualized. No aortic stenosis is present.  8. Mild pulmonary HTN, PASP is 37 mmHg.  9. The inferior vena cava is normal in size with greater than 50%  respiratory variability, suggesting  right atrial pressure of 3 mmHg.   Assessment and Plan:  1.  Persistent atrial fibrillation/atypical atrial flutter.  Plan to manage her as strategy of permanent atrial fibrillation with heart rate control and anticoagulation.  Toprol-XL is 100 mg daily and she continues on Eliquis.  Follow-up CBC and BMET for next visit.  2.  History of nonischemic cardiomyopathy, recent echocardiogram shows low normal LVEF at 50 to 55%, no significant changes.   Medication Adjustments/Labs and Tests Ordered: Current medicines are reviewed at length with the patient today.  Concerns regarding medicines are outlined above.   Tests Ordered: Orders Placed This Encounter  Procedures  . CBC  . Basic Metabolic Panel (BMET)    Medication Changes: No orders of the defined types were placed in this encounter.   Disposition:  Follow up 4 months in  the [place] office.  Signed, Satira Sark, MD, [facility] 07/12/2019 1:13 PM    [facility] at [facility] [address] [place], [place] [address] Phone: [phone]; Fax: [phone]

## 2019-07-12 NOTE — Patient Outreach (Signed)
[facility]) Care Management  07/12/2019  [name] 12/24/1940 [id number]   EMMI-GENERAL DISCHARGE-RESOLVED RED ON EMMI ALERT Day #4 Date: 07/11/2019 Red Alert Reason: Question/problems  Outreach #1  RN spoke with concerning the above EMMI. Pt reports all her concerns and questions have been answer with a follow up visit today with her provider.  Plan: No additional needs at pt indicates she is doing well. Case will be closed.  Elliot Cousin, RN Care Management Coordinator [facility] Main Office [phone]

## 2019-07-12 NOTE — Patient Instructions (Signed)
Medication Instructions:  Your physician recommends that you continue on your current medications as directed. Please refer to the Current Medication list given to you today.  *If you need a refill on your cardiac medications before your next appointment, please call your pharmacy*   Lab Work: cbc and bmet JUST BEFORE next visit   If you have labs (blood work) drawn today and your tests are completely normal, you will receive your results only by: Marland Kitchen MyChart Message (if you have MyChart) OR . A paper copy in the mail If you have any lab test that is abnormal or we need to change your treatment, we will call you to review the results.   Testing/Procedures: None    Follow-Up: At [facility], you and your health needs are our priority.  As part of our continuing mission to provide you with exceptional heart care, we have created designated Provider Care Teams.  These Care Teams include your primary Cardiologist (physician) and Advanced Practice Providers (APPs -  Physician Assistants and Nurse Practitioners) who all work together to provide you with the care you need, when you need it.  We recommend signing up for the patient portal called "MyChart".  Sign up information is provided on this After Visit Summary.  MyChart is used to connect with patients for Virtual Visits (Telemedicine).  Patients are able to view lab/test results, encounter notes, upcoming appointments, etc.  Non-urgent messages can be sent to your provider as well.   To learn more about what you can do with MyChart, go to [email or web address].    Your next appointment:   4 month(s)  The format for your next appointment:   In Person  Provider:   [name], MD, or Randall An, PA-C   Other Instructions None      Thank you for choosing [facility] !

## 2019-07-13 ENCOUNTER — Telehealth: Payer: Self-pay | Admitting: Cardiology

## 2019-07-13 MED ORDER — METOPROLOL SUCCINATE ER 100 MG PO TB24: 100.0000 mg | ORAL_TABLET | Freq: Every day | ORAL | 1 refills | Status: DC

## 2019-07-13 NOTE — Telephone Encounter (Signed)
Please call pt concerning metoprolol succinate (TOPROL-XL) 100 MG 24 hr tablet [[id number]]   [phone]

## 2019-07-13 NOTE — Telephone Encounter (Signed)
Pt called and request that Toprol XL be sent to mail order pharmacy [facility].

## 2019-07-17 DIAGNOSIS — I484 Atypical atrial flutter: Secondary | ICD-10-CM | POA: Diagnosis not present

## 2019-07-17 DIAGNOSIS — I2510 Atherosclerotic heart disease of native coronary artery without angina pectoris: Secondary | ICD-10-CM | POA: Diagnosis not present

## 2019-07-17 DIAGNOSIS — Z7901 Long term (current) use of anticoagulants: Secondary | ICD-10-CM | POA: Diagnosis not present

## 2019-07-17 DIAGNOSIS — I10 Essential (primary) hypertension: Secondary | ICD-10-CM | POA: Diagnosis not present

## 2019-09-18 DIAGNOSIS — I484 Atypical atrial flutter: Secondary | ICD-10-CM | POA: Diagnosis not present

## 2019-09-18 DIAGNOSIS — I25110 Atherosclerotic heart disease of native coronary artery with unstable angina pectoris: Secondary | ICD-10-CM | POA: Diagnosis not present

## 2019-09-18 DIAGNOSIS — R5383 Other fatigue: Secondary | ICD-10-CM | POA: Diagnosis not present

## 2019-09-18 DIAGNOSIS — N390 Urinary tract infection, site not specified: Secondary | ICD-10-CM | POA: Diagnosis not present

## 2019-09-18 DIAGNOSIS — Z0000 Encounter for general adult medical examination without abnormal findings: Secondary | ICD-10-CM | POA: Diagnosis not present

## 2019-09-18 DIAGNOSIS — R799 Abnormal finding of blood chemistry, unspecified: Secondary | ICD-10-CM | POA: Diagnosis not present

## 2019-09-29 ENCOUNTER — Encounter: Payer: Self-pay | Admitting: Emergency Medicine

## 2019-09-29 ENCOUNTER — Ambulatory Visit
Admission: EM | Admit: 2019-09-29 | Discharge: 2019-09-29 | Disposition: A | Discharge status: Home / Self Care | Payer: Medicare Other | Attending: Emergency Medicine | Admitting: Emergency Medicine

## 2019-09-29 ENCOUNTER — Other Ambulatory Visit: Payer: Self-pay

## 2019-09-29 DIAGNOSIS — Z9189 Other specified personal risk factors, not elsewhere classified: Secondary | ICD-10-CM

## 2019-09-29 DIAGNOSIS — Z1152 Encounter for screening for COVID-19: Secondary | ICD-10-CM | POA: Diagnosis not present

## 2019-09-29 NOTE — Discharge Instructions (Signed)

## 2019-09-29 NOTE — ED Triage Notes (Signed)
Needs covid test to be able to go to her Dr office on Monday

## 2019-09-29 NOTE — ED Provider Notes (Addendum)
[facility] CARE [facility]ENTER   [id number] 09/29/19 Arrival Time: 1357   Chief Complaint  Patient presents with  . Covid Exposure     SUBJECTIVE: History from: patient.  [name] is a 79 y.o. female who presents for COVID testing.  Denies sick exposure to COVID, flu or strep.  Denies recent travel.  Denies aggravating or alleviating symptoms.  Denies previous COVID infection.   Denies fever, chills, fatigue, nasal congestion, rhinorrhea, sore throat, cough, SOB, wheezing, chest pain, nausea, vomiting, changes in bowel or bladder habits.     ROS: As per HPI.  All other pertinent ROS negative.     Past Medical History:  Diagnosis Date  . Anemia   . Cardiomyopathy (HCC)    LVEF 40-45% 2011  . CHF (congestive heart failure) (HCC)   . Chronic insomnia   . Coronary atherosclerosis of native coronary artery    60% LAD in 2002  . DJD (degenerative joint disease)    Left THA  . Essential hypertension   . GERD (gastroesophageal reflux disease)   . Hyperlipidemia   . Hypothyroidism   . Left bundle branch block   . Nephrolithiasis   . Obesity   . Paroxysmal atrial flutter [facility])    Documented January 2018   Past Surgical History:  Procedure Laterality Date  . ABDOMINAL HYSTERECTOMY     (partial)  . APPENDECTOMY    . CHOLECYSTECTOMY    . COLONOSCOPY  Aug 2013   Dr. Loreta Ave, [facility]: small internal hemorrhoids and few scattered sigmoid diverticula, otherwise normal up to TI, TI appeared normal  . ESOPHAGOGASTRODUODENOSCOPY N/A 06/01/2012   RMR:5 cm hiatal hernia. Essentially normal esophagus  . HEMORROIDECTOMY    . JOINT REPLACEMENT    . NOSE SURGERY    . REVISION TOTAL HIP ARTHROPLASTY  2009   Left THA 2009   Allergies  Allergen Reactions  . Codeine Itching    "feel like going buggy"  Itchy skin  . Tape Itching    Nylon tape makes pt feel itchy   No current facility-administered medications on file prior to encounter.   Current Outpatient  Medications on File Prior to Encounter  Medication Sig Dispense Refill  . Calcium Carbonate-Vitamin D (CALCIUM 600 + D PO) Take 1 tablet by mouth daily.     Marland Kitchen ELIQUIS 5 MG TABS tablet Take 1 tablet by mouth twice daily 60 tablet 11  . furosemide (LASIX) 40 MG tablet Take 1 tablet (40 mg total) by mouth daily. 30 tablet 1  . gabapentin (NEURONTIN) 100 MG capsule Take 100 mg by mouth at bedtime.    . isosorbide mononitrate (IMDUR) 30 MG 24 hr tablet Take 1 tablet by mouth every day 30 tablet 11  . lisinopril (PRINIVIL,ZESTRIL) 20 MG tablet Take 20 mg by mouth 2 (two) times daily.     . metoprolol succinate (TOPROL-XL) 100 MG 24 hr tablet Take 1 tablet (100 mg total) by mouth daily. Take with or immediately following a meal. 90 tablet 1  . Multiple Vitamin (MULTIVITAMIN WITH MINERALS) TABS Take 1 tablet by mouth daily.    . nitroGLYCERIN (NITROSTAT) 0.4 MG SL tablet Place 1 tablet (0.4 mg total) under the tongue every 5 (five) minutes as needed. For chest pain 25 tablet 3  . omeprazole (PRILOSEC) 20 MG capsule Take 20 mg by mouth every morning.     . pravastatin (PRAVACHOL) 40 MG tablet Take 40 mg by mouth at bedtime.     . tamsulosin (FLOMAX) 0.4  MG CAPS capsule Take 1 capsule (0.4 mg total) by mouth daily. 30 capsule 0   Social History   Socioeconomic History  . Marital status: Widowed    Spouse name: Not on file  . Number of children: Not on file  . Years of education: Not on file  . Highest education level: Not on file  Occupational History  . Occupation: retired    Comment: used to work at [employer] in [place]: [place] Use  . Smoking status: Never Smoker  . Smokeless tobacco: Never Used  Vaping Use  . Vaping Use: Never used  Substance and Sexual Activity  . Alcohol use: No    Alcohol/week: 0.0 standard drinks  . Drug use: No  . Sexual activity: Not on file  Other Topics Concern  . Not on file  Social History Narrative  . Not on file   Social  Determinants of Health   Financial Resource Strain:   . Difficulty of Paying Living Expenses:   Food Insecurity:   . Worried About Charity fundraiser in the Last Year:   . Arboriculturist in the Last Year:   Transportation Needs:   . Film/video editor (Medical):   Marland Kitchen Lack of Transportation (Non-Medical):   Physical Activity:   . Days of Exercise per Week:   . Minutes of Exercise per Session:   Stress:   . Feeling of Stress :   Social Connections:   . Frequency of Communication with Friends and Family:   . Frequency of Social Gatherings with Friends and Family:   . Attends Religious Services:   . Active Member of Clubs or Organizations:   . Attends Archivist Meetings:   Marland Kitchen Marital Status:   Intimate Partner Violence:   . Fear of Current or Ex-Partner:   . Emotionally Abused:   Marland Kitchen Physically Abused:   . Sexually Abused:    Family History  Problem Relation Age of Onset  . Heart attack Father   . Heart attack Mother   . Diabetes Brother   . Colon cancer Neg Hx     OBJECTIVE:  Vitals:   09/29/19 1405 09/29/19 1412  BP:  116/74  Pulse:  90  Resp:  18  Temp:  98.2 F (36.8 C)  TempSrc:  Oral  SpO2:  94%  Weight: 205 lb 0.4 oz (93 kg)   Height: 5\' 3"  (1.6 m)      General appearance: alert;  nontoxic; speaking in full sentences and tolerating own secretions HEENT: NCAT; Ears: EACs clear, TMs pearly gray; Eyes: PERRL.  EOM grossly intact. Sinuses: nontender; Nose: nares patent without rhinorrhea, Throat: oropharynx clear, tonsils non erythematous or enlarged, uvula midline  Neck: supple without LAD Lungs: unlabored respirations, symmetrical air entry; cough: absent; no respiratory distress; CTAB Heart: regular rate and rhythm.  Radial pulses 2+ symmetrical bilaterally Skin: warm and dry Psychological: alert and cooperative; normal mood and affect  LABS:  No results found for this or any previous visit (from the past 24 hour(s)).   ASSESSMENT &  PLAN:  1. At increased risk of exposure to COVID-19 virus   2. Encounter for screening for COVID-19     No orders of the defined types were placed in this encounter.   Discharge instructions  COVID testing ordered.  It will take between 2-7 days for test results.  Someone will contact you regarding abnormal results.    In the meantime: You should  remain isolated in your home for 10 days from symptom onset AND greater than 24 hours after symptoms resolution (absence of fever without the use of fever-reducing medication and improvement in respiratory symptoms), whichever is longer Get plenty of rest and push fluids Use medications daily for symptom relief Use OTC medications like ibuprofen or tylenol as needed fever or pain Call or go to the ED if you have any new or worsening symptoms such as fever, worsening cough, shortness of breath, chest tightness, chest pain, turning blue, changes in mental status, etc...   Reviewed expectations re: course of current medical issues. Questions answered. Outlined signs and symptoms indicating need for more acute intervention. Patient verbalized understanding. After Visit Summary given.          Note: This document was prepared using Dragon voice recognition software and may include unintentional dictation errors.     Durward Parcel, FNP 09/29/19 1414

## 2019-09-30 LAB — SARS-COV-2, NAA 2 DAY TAT

## 2019-09-30 LAB — NOVEL CORONAVIRUS, NAA: SARS-CoV-2, NAA: NOT DETECTED

## 2019-10-02 DIAGNOSIS — R310 Gross hematuria: Secondary | ICD-10-CM | POA: Diagnosis not present

## 2019-10-02 DIAGNOSIS — R350 Frequency of micturition: Secondary | ICD-10-CM | POA: Diagnosis not present

## 2019-10-16 DIAGNOSIS — R350 Frequency of micturition: Secondary | ICD-10-CM | POA: Diagnosis not present

## 2019-10-16 DIAGNOSIS — Z7901 Long term (current) use of anticoagulants: Secondary | ICD-10-CM | POA: Diagnosis not present

## 2019-11-08 DIAGNOSIS — H04123 Dry eye syndrome of bilateral lacrimal glands: Secondary | ICD-10-CM | POA: Diagnosis not present

## 2019-11-08 DIAGNOSIS — H53123 Transient visual loss, bilateral: Secondary | ICD-10-CM | POA: Diagnosis not present

## 2019-11-08 DIAGNOSIS — Z961 Presence of intraocular lens: Secondary | ICD-10-CM | POA: Diagnosis not present

## 2019-11-08 DIAGNOSIS — H353131 Nonexudative age-related macular degeneration, bilateral, early dry stage: Secondary | ICD-10-CM | POA: Diagnosis not present

## 2019-11-10 DIAGNOSIS — Z7901 Long term (current) use of anticoagulants: Secondary | ICD-10-CM | POA: Diagnosis not present

## 2019-11-10 DIAGNOSIS — I10 Essential (primary) hypertension: Secondary | ICD-10-CM | POA: Diagnosis not present

## 2019-11-10 DIAGNOSIS — I2510 Atherosclerotic heart disease of native coronary artery without angina pectoris: Secondary | ICD-10-CM | POA: Diagnosis not present

## 2019-11-10 DIAGNOSIS — I484 Atypical atrial flutter: Secondary | ICD-10-CM | POA: Diagnosis not present

## 2019-12-03 ENCOUNTER — Other Ambulatory Visit: Payer: Self-pay | Admitting: Cardiology

## 2019-12-07 ENCOUNTER — Other Ambulatory Visit: Payer: Self-pay | Admitting: Cardiology

## 2019-12-08 ENCOUNTER — Ambulatory Visit (HOSPITAL_COMMUNITY)
Admission: RE | Admit: 2019-12-08 | Discharge: 2019-12-08 | Disposition: A | Discharge status: Home / Self Care | Payer: Medicare Other | Source: Ambulatory Visit | Attending: Family Medicine | Admitting: Family Medicine

## 2019-12-08 ENCOUNTER — Other Ambulatory Visit: Payer: Self-pay

## 2019-12-08 DIAGNOSIS — I361 Nonrheumatic tricuspid (valve) insufficiency: Secondary | ICD-10-CM | POA: Diagnosis not present

## 2019-12-08 DIAGNOSIS — I10 Essential (primary) hypertension: Secondary | ICD-10-CM | POA: Diagnosis not present

## 2019-12-08 DIAGNOSIS — K219 Gastro-esophageal reflux disease without esophagitis: Secondary | ICD-10-CM | POA: Insufficient documentation

## 2019-12-08 DIAGNOSIS — R079 Chest pain, unspecified: Secondary | ICD-10-CM | POA: Insufficient documentation

## 2019-12-08 DIAGNOSIS — I081 Rheumatic disorders of both mitral and tricuspid valves: Secondary | ICD-10-CM | POA: Diagnosis not present

## 2019-12-08 DIAGNOSIS — I340 Nonrheumatic mitral (valve) insufficiency: Secondary | ICD-10-CM | POA: Diagnosis not present

## 2019-12-08 DIAGNOSIS — Z8679 Personal history of other diseases of the circulatory system: Secondary | ICD-10-CM | POA: Diagnosis not present

## 2019-12-08 DIAGNOSIS — E785 Hyperlipidemia, unspecified: Secondary | ICD-10-CM | POA: Diagnosis not present

## 2019-12-08 DIAGNOSIS — R0602 Shortness of breath: Secondary | ICD-10-CM | POA: Diagnosis not present

## 2019-12-08 DIAGNOSIS — I4891 Unspecified atrial fibrillation: Secondary | ICD-10-CM | POA: Diagnosis not present

## 2019-12-08 LAB — ECHOCARDIOGRAM COMPLETE
Area-P 1/2: 6.74 cm2
MV M vel: 5.28 m/s
MV Peak grad: 111.5 mmHg
Radius: 0.7 cm
S' Lateral: 3.72 cm

## 2019-12-08 NOTE — Progress Notes (Signed)
*  PRELIMINARY RESULTS* Echocardiogram 2D Echocardiogram has been performed.  Jeryl Columbia 12/08/2019, 1:48 PM

## 2019-12-13 ENCOUNTER — Ambulatory Visit: Payer: Medicare Other | Admitting: Cardiology

## 2019-12-22 ENCOUNTER — Other Ambulatory Visit (HOSPITAL_COMMUNITY): Payer: Self-pay | Admitting: Family Medicine

## 2019-12-22 DIAGNOSIS — Z1231 Encounter for screening mammogram for malignant neoplasm of breast: Secondary | ICD-10-CM

## 2020-01-01 ENCOUNTER — Encounter: Payer: Self-pay | Admitting: Cardiology

## 2020-01-01 ENCOUNTER — Ambulatory Visit (INDEPENDENT_AMBULATORY_CARE_PROVIDER_SITE_OTHER): Payer: Medicare Other | Admitting: Cardiology

## 2020-01-01 VITALS — BP 142/78 | HR 91 | Ht 63.0 in | Wt 205.0 lb

## 2020-01-01 DIAGNOSIS — Z8679 Personal history of other diseases of the circulatory system: Secondary | ICD-10-CM | POA: Diagnosis not present

## 2020-01-01 DIAGNOSIS — I428 Other cardiomyopathies: Secondary | ICD-10-CM

## 2020-01-01 DIAGNOSIS — I484 Atypical atrial flutter: Secondary | ICD-10-CM | POA: Diagnosis not present

## 2020-01-01 MED ORDER — SACUBITRIL-VALSARTAN 49-51 MG PO TABS: 1.0000 | ORAL_TABLET | Freq: Two times a day (BID) | ORAL | 3 refills | Status: DC

## 2020-01-01 NOTE — Progress Notes (Signed)
Cardiology Office Note  Date: 01/01/2020   ID: Faelynn, Wynder 04-29-1940, MRN [id number]  PCP:  [name], MD  Cardiologist:  [name], MD Electrophysiologist:  None   Chief Complaint  Patient presents with  . Cardiac follow-up    History of Present Illness: MARJA ADDERLEY is a 79 y.o. female last seen in March.  She presents for a routine visit.  Overall states that she has had no major change in stamina.  She enjoys dancing, also has been preparing meals twice daily for a friend.  She remains functional with ADLs.  No general sense of palpitations or chest discomfort.  She describes NYHA class II dyspnea.  I reviewed her medications which are outlined below.  Recent echocardiogram showed LVEF 35% with global hypokinesis, severe left atrial enlargement with moderate mitral regurgitation, normal RV contraction with RVSP 46 mmHg.  Today we discussed medication adjustments.  Past Medical History:  Diagnosis Date  . Anemia   . Cardiomyopathy (HCC)    LVEF 40-45% 2011  . CHF (congestive heart failure) (HCC)   . Chronic insomnia   . Coronary atherosclerosis of native coronary artery    60% LAD in 2002  . DJD (degenerative joint disease)    Left THA  . Essential hypertension   . GERD (gastroesophageal reflux disease)   . Hyperlipidemia   . Hypothyroidism   . Left bundle branch block   . Nephrolithiasis   . Obesity   . Paroxysmal atrial flutter [facility])    Documented January 2018    Past Surgical History:  Procedure Laterality Date  . ABDOMINAL HYSTERECTOMY     (partial)  . APPENDECTOMY    . CHOLECYSTECTOMY    . COLONOSCOPY  Aug 2013   Dr. Loreta Ave, [facility]: small internal hemorrhoids and few scattered sigmoid diverticula, otherwise normal up to TI, TI appeared normal  . ESOPHAGOGASTRODUODENOSCOPY N/A 06/01/2012   RMR:5 cm hiatal hernia. Essentially normal esophagus  . HEMORROIDECTOMY    . JOINT REPLACEMENT    . NOSE SURGERY    .  REVISION TOTAL HIP ARTHROPLASTY  2009   Left THA 2009    Current Outpatient Medications  Medication Sig Dispense Refill  . Calcium Carbonate-Vitamin D (CALCIUM 600 + D PO) Take 1 tablet by mouth daily.     Marland Kitchen ELIQUIS 5 MG TABS tablet Take 1 tablet by mouth twice daily 60 tablet 11  . furosemide (LASIX) 40 MG tablet Take 1 tablet (40 mg total) by mouth daily. 30 tablet 1  . gabapentin (NEURONTIN) 100 MG capsule Take 100 mg by mouth at bedtime.    . isosorbide mononitrate (IMDUR) 30 MG 24 hr tablet Take 1 tablet by mouth every day 90 tablet 1  . metoprolol succinate (TOPROL-XL) 100 MG 24 hr tablet Take 1 tablet by mouth every day after a meal 90 tablet 1  . Multiple Vitamin (MULTIVITAMIN WITH MINERALS) TABS Take 1 tablet by mouth daily.    . nitroGLYCERIN (NITROSTAT) 0.4 MG SL tablet Place 1 tablet (0.4 mg total) under the tongue every 5 (five) minutes as needed. For chest pain 25 tablet 3  . omeprazole (PRILOSEC) 20 MG capsule Take 20 mg by mouth every morning.     . pravastatin (PRAVACHOL) 40 MG tablet Take 40 mg by mouth at bedtime.     . tamsulosin (FLOMAX) 0.4 MG CAPS capsule Take 1 capsule (0.4 mg total) by mouth daily. 30 capsule 0  . sacubitril-valsartan (ENTRESTO) 49-51 MG Take 1  tablet by mouth 2 (two) times daily. 60 tablet 3   No current facility-administered medications for this visit.   Allergies:  Codeine and Tape   ROS:   No syncope.  Physical Exam: VS:  BP (!) 142/78   Pulse 91   Ht 5\' 3"  (1.6 m)   Wt 205 lb (93 kg)   SpO2 98%   BMI 36.31 kg/m , BMI Body mass index is 36.31 kg/m.  Wt Readings from Last 3 Encounters:  01/01/20 205 lb (93 kg)  09/29/19 205 lb 0.4 oz (93 kg)  07/12/19 207 lb (93.9 kg)    General: Patient appears comfortable at rest. HEENT: Conjunctiva and lids normal, wearing a mask. Neck: Supple, no elevated JVP or carotid bruits, no thyromegaly. Lungs: Clear to auscultation, nonlabored breathing at rest. Cardiac: Irregularly irregular, no S3,  2/6 systolic murmur, no pericardial rub. Abdomen: Soft, bowel sounds present. Extremities: Stable appearing leg edema, distal pulses 2+.  ECG:  An ECG dated 07/03/2019 was personally reviewed today and demonstrated:  Atrial fibrillation with RVR, left bundle branch block.  Recent Labwork: 07/03/2019: B Natriuretic Peptide 255.0 07/04/2019: TSH 3.019 07/06/2019: BUN 22; Creatinine, Ser 1.11; Hemoglobin 12.3; Magnesium 1.9; Platelets 258; Potassium 4.0; Sodium 141   Other Studies Reviewed Today:  Echocardiogram 12/08/2019: 1. Left ventricular ejection fraction, by estimation, is 35%. The left  ventricle has moderately decreased function. The left ventricle  demonstrates global hypokinesis. There is mild left ventricular  hypertrophy. Left ventricular diastolic parameters are  indeterminate.  2. Right ventricular systolic function is normal. The right ventricular  size is normal. There is moderately elevated pulmonary artery systolic  pressure. The estimated right ventricular systolic pressure is 46.0 mmHg.  3. Left atrial size was severely dilated.  4. Right atrial size was moderately dilated.  5. The mitral valve is abnormal. Moderate mitral valve regurgitation.  6. Tricuspid valve regurgitation is moderate.  7. The aortic valve is tricuspid. Aortic valve regurgitation is not  visualized.  8. The inferior vena cava is normal in size with greater than 50%  respiratory variability, suggesting right atrial pressure of 3 mmHg.   Assessment and Plan:  1.  Nonischemic cardiomyopathy, most recent LVEF approximately 35% as noted above.  She has been reasonably stable from a symptom perspective, no progressive weight gain.  Continue Toprol-XL and switch from lisinopril to Entresto.  She will need follow-up BMP T in 7 to 10 days.  May then add Aldactone or Lanoxin next.  2.  Permanent atrial fibrillation and atypical atrial flutter.  Continue goal of heart rate control, she is currently  on Toprol-XL 100 mg daily and remains on Eliquis for stroke prophylaxis.  She will have follow-up lab work soon with her PCP, denies any spontaneous bleeding problems.  Might add low-dose Lanoxin with associated cardiomyopathy as noted above.  Medication Adjustments/Labs and Tests Ordered: Current medicines are reviewed at length with the patient today.  Concerns regarding medicines are outlined above.   Tests Ordered: Orders Placed This Encounter  Procedures  . Basic Metabolic Panel (BMET)    Medication Changes: Meds ordered this encounter  Medications  . sacubitril-valsartan (ENTRESTO) 49-51 MG    Sig: Take 1 tablet by mouth 2 (two) times daily.    Dispense:  60 tablet    Refill:  3    STOP LISINOPRIL 01/01/2020    Disposition:  Follow up 1 month in the [place] office.  Signed, [place], MD, [facility] 01/01/2020 2:12 PM    [facility] at [place], [place], [place] [address] Phone: [phone]; Fax: [phone]

## 2020-01-01 NOTE — Patient Instructions (Signed)
Your physician recommends that you schedule a follow-up appointment in: 1 MONTH WITH DR MCDOWELL  Your physician has recommended you make the following change in your medication:   STOP LISINOPRIL   AFTER 36 HOURS OF STOPPING LISINOPRIL BEGIN ENTRESTO 49/51 MG TWICE DAILY   Your physician recommends that you return for lab work in: 7-10 DAYS AFTER STARTING ENTRESTO   Thank you for choosing [facility]!!

## 2020-01-10 ENCOUNTER — Telehealth: Payer: Self-pay | Admitting: *Deleted

## 2020-01-10 NOTE — Telephone Encounter (Signed)
Reports taking last lisinopril dose this morning. Advised to start entresto 49/51 mg BID on Saturday morning Advised to get lab work in 7-10 days of starting entresto 10/09-10/12 Verbalized understanding

## 2020-01-12 ENCOUNTER — Other Ambulatory Visit: Payer: Self-pay | Admitting: Cardiology

## 2020-01-22 DIAGNOSIS — Z23 Encounter for immunization: Secondary | ICD-10-CM | POA: Diagnosis not present

## 2020-01-22 DIAGNOSIS — I2510 Atherosclerotic heart disease of native coronary artery without angina pectoris: Secondary | ICD-10-CM | POA: Diagnosis not present

## 2020-01-22 DIAGNOSIS — I480 Paroxysmal atrial fibrillation: Secondary | ICD-10-CM | POA: Diagnosis not present

## 2020-01-22 DIAGNOSIS — Z7901 Long term (current) use of anticoagulants: Secondary | ICD-10-CM | POA: Diagnosis not present

## 2020-01-22 DIAGNOSIS — I10 Essential (primary) hypertension: Secondary | ICD-10-CM | POA: Diagnosis not present

## 2020-01-29 ENCOUNTER — Other Ambulatory Visit: Payer: Self-pay | Admitting: *Deleted

## 2020-01-29 MED ORDER — APIXABAN 5 MG PO TABS: 5.0000 mg | ORAL_TABLET | Freq: Two times a day (BID) | ORAL | 11 refills | Status: DC

## 2020-02-05 ENCOUNTER — Other Ambulatory Visit: Payer: Self-pay

## 2020-02-05 ENCOUNTER — Ambulatory Visit (HOSPITAL_COMMUNITY)
Admission: RE | Admit: 2020-02-05 | Discharge: 2020-02-05 | Disposition: A | Discharge status: Home / Self Care | Payer: Medicare Other | Source: Ambulatory Visit | Attending: Family Medicine | Admitting: Family Medicine

## 2020-02-05 ENCOUNTER — Other Ambulatory Visit: Payer: Self-pay | Admitting: Cardiology

## 2020-02-05 DIAGNOSIS — Z1231 Encounter for screening mammogram for malignant neoplasm of breast: Secondary | ICD-10-CM | POA: Insufficient documentation

## 2020-02-13 NOTE — Progress Notes (Signed)
Cardiology Office Note  Date: 02/14/2020   ID: [name], [name] 08-30-1940, MRN [id number]  PCP:  [name], MD  Cardiologist:  [name], MD Electrophysiologist:  None   Chief Complaint  Patient presents with  . Cardiac follow-up    History of Present Illness: [name] is a 79 y.o. female last seen in September.  She presents for a follow-up visit.  States that she feels fatigued, no chest pain with NYHA class II dyspnea.  She mentions that she had a few weeks where she noticed intermittent dark stools, no hematochezia.  She has not noticed anything in the last week.  At the last visit we switched from lisinopril to [facility].  She is due for follow-up lab work.  We went over the remainder of her medications which are outlined below.  She reports compliance.  Her weight has however crept up nearly 8 pounds of scales are correct.  She has somewhat progressive but chronic appearing lower extremity edema/lymphedema.  Past Medical History:  Diagnosis Date  . Anemia   . Cardiomyopathy (HCC)    LVEF 40-45% 2011  . CHF (congestive heart failure) (HCC)   . Chronic insomnia   . Coronary atherosclerosis of native coronary artery    60% LAD in 2002  . DJD (degenerative joint disease)    Left THA  . Essential hypertension   . GERD (gastroesophageal reflux disease)   . Hyperlipidemia   . Hypothyroidism   . Left bundle branch block   . Nephrolithiasis   . Obesity   . Paroxysmal atrial flutter [facility])    Documented January 2018    Past Surgical History:  Procedure Laterality Date  . ABDOMINAL HYSTERECTOMY     (partial)  . APPENDECTOMY    . CHOLECYSTECTOMY    . COLONOSCOPY  Aug 2013   Dr. Loreta Ave, [facility]: small internal hemorrhoids and few scattered sigmoid diverticula, otherwise normal up to TI, TI appeared normal  . ESOPHAGOGASTRODUODENOSCOPY N/A 06/01/2012   RMR:5 cm hiatal hernia. Essentially normal esophagus  . HEMORROIDECTOMY    .  JOINT REPLACEMENT    . NOSE SURGERY    . REVISION TOTAL HIP ARTHROPLASTY  2009   Left THA 2009    Current Outpatient Medications  Medication Sig Dispense Refill  . apixaban (ELIQUIS) 5 MG TABS tablet Take 1 tablet (5 mg total) by mouth 2 (two) times daily. 60 tablet 11  . Calcium Carbonate-Vitamin D (CALCIUM 600 + D PO) Take 1 tablet by mouth daily.     Marland Kitchen ENTRESTO 49-51 MG Take 1 tablet by mouth twice daily 60 tablet 11  . furosemide (LASIX) 40 MG tablet Take 1 tablet (40 mg total) by mouth daily. 30 tablet 1  . gabapentin (NEURONTIN) 100 MG capsule Take 100 mg by mouth at bedtime.    . isosorbide mononitrate (IMDUR) 30 MG 24 hr tablet Take 1 tablet by mouth every day 90 tablet 1  . metoprolol succinate (TOPROL-XL) 100 MG 24 hr tablet Take 1 tablet by mouth every day after a meal 90 tablet 1  . Multiple Vitamin (MULTIVITAMIN WITH MINERALS) TABS Take 1 tablet by mouth daily.    . nitroGLYCERIN (NITROSTAT) 0.4 MG SL tablet Place 1 tablet (0.4 mg total) under the tongue every 5 (five) minutes as needed. For chest pain 25 tablet 3  . omeprazole (PRILOSEC) 20 MG capsule Take 20 mg by mouth every morning.     . pravastatin (PRAVACHOL) 40 MG tablet Take 40  mg by mouth at bedtime.     . tamsulosin (FLOMAX) 0.4 MG CAPS capsule Take 1 capsule (0.4 mg total) by mouth daily. 30 capsule 0   No current facility-administered medications for this visit.   Allergies:  Codeine and Tape   ROS: No palpitations or syncope.  Physical Exam: VS:  BP 116/80   Pulse 74   Ht 5\' 3"  (1.6 m)   Wt 213 lb 3.2 oz (96.7 kg)   SpO2 97%   BMI 37.77 kg/m , BMI Body mass index is 37.77 kg/m.  Wt Readings from Last 3 Encounters:  02/14/20 213 lb 3.2 oz (96.7 kg)  01/01/20 205 lb (93 kg)  09/29/19 205 lb 0.4 oz (93 kg)    General: Patient appears comfortable at rest. HEENT: Conjunctiva and lids normal, wearing a mask. Neck: Supple, no elevated JVP or carotid bruits, no thyromegaly. Lungs: Clear to auscultation,  nonlabored breathing at rest. Cardiac: Irregularly irregular, no S3, 2/6 systolic murmur. Extremities: Bilateral leg edema/lymphedema.  ECG:  An ECG dated 07/03/2019 was personally reviewed today and demonstrated:  Atrial fibrillation with RVR, left bundle branch block.  Recent Labwork: 07/03/2019: B Natriuretic Peptide 255.0 07/04/2019: TSH 3.019 07/06/2019: BUN 22; Creatinine, Ser 1.11; Hemoglobin 12.3; Magnesium 1.9; Platelets 258; Potassium 4.0; Sodium 141     Component Value Date/Time   CHOL 155 06/17/2009 2016   TRIG 97 06/17/2009 2016   HDL 62 06/17/2009 2016   CHOLHDL 2.5 Ratio 06/17/2009 2016   VLDL 19 06/17/2009 2016   LDLCALC 74 06/17/2009 2016    Other Studies Reviewed Today:  Echocardiogram 12/08/2019: 1. Left ventricular ejection fraction, by estimation, is 35%. The left  ventricle has moderately decreased function. The left ventricle  demonstrates global hypokinesis. There is mild left ventricular  hypertrophy. Left ventricular diastolic parameters are  indeterminate.  2. Right ventricular systolic function is normal. The right ventricular  size is normal. There is moderately elevated pulmonary artery systolic  pressure. The estimated right ventricular systolic pressure is 46.0 mmHg.  3. Left atrial size was severely dilated.  4. Right atrial size was moderately dilated.  5. The mitral valve is abnormal. Moderate mitral valve regurgitation.  6. Tricuspid valve regurgitation is moderate.  7. The aortic valve is tricuspid. Aortic valve regurgitation is not  visualized.  8. The inferior vena cava is normal in size with greater than 50%  respiratory variability, suggesting right atrial pressure of 3 mmHg.   Assessment and Plan:  1.  Nonischemic cardiomyopathy with recent LVEF approximately 35%, normal RV contraction with moderately elevated pulmonary artery systolic pressure.  She has tolerated switch to [facility], otherwise continues on Toprol-XL and Lasix.   Check BMET with further titration.  Her weight is creeping back up, we might add Aldactone next and potentially switch Lasix to Demadex.  2.  Reported dark stools as discussed above.  Check CBC.  3.  Permanent atrial fibrillation and atypical atrial flutter.  CHA2DS2-VASc score is 6.  She is on Eliquis for stroke prophylaxis, also Toprol-XL for heart rate control.  Considering the addition of Lanoxin as well depending on follow-up lab work.  Medication Adjustments/Labs and Tests Ordered: Current medicines are reviewed at length with the patient today.  Concerns regarding medicines are outlined above.   Tests Ordered: Orders Placed This Encounter  Procedures  . CBC  . Basic metabolic panel    Medication Changes: No orders of the defined types were placed in this encounter.   Disposition:  Follow up 6 weeks  in the [place] office.  Signed, Jonelle Sidle, MD, [facility] 02/14/2020 2:27 PM    [facility] at [facility] [address] [place], [place] [address] Phone: [phone]; Fax: [phone]

## 2020-02-14 ENCOUNTER — Other Ambulatory Visit: Payer: Self-pay

## 2020-02-14 ENCOUNTER — Ambulatory Visit (INDEPENDENT_AMBULATORY_CARE_PROVIDER_SITE_OTHER): Payer: Medicare Other | Admitting: Cardiology

## 2020-02-14 ENCOUNTER — Other Ambulatory Visit (HOSPITAL_COMMUNITY)
Admission: RE | Admit: 2020-02-14 | Discharge: 2020-02-14 | Disposition: A | Discharge status: Home / Self Care | Payer: Medicare Other | Source: Ambulatory Visit | Attending: Cardiology | Admitting: Cardiology

## 2020-02-14 ENCOUNTER — Encounter: Payer: Self-pay | Admitting: Cardiology

## 2020-02-14 VITALS — BP 116/80 | HR 74 | Ht 63.0 in | Wt 213.2 lb

## 2020-02-14 DIAGNOSIS — I428 Other cardiomyopathies: Secondary | ICD-10-CM

## 2020-02-14 DIAGNOSIS — I10 Essential (primary) hypertension: Secondary | ICD-10-CM | POA: Diagnosis not present

## 2020-02-14 DIAGNOSIS — I5033 Acute on chronic diastolic (congestive) heart failure: Secondary | ICD-10-CM

## 2020-02-14 DIAGNOSIS — I4821 Permanent atrial fibrillation: Secondary | ICD-10-CM

## 2020-02-14 LAB — CBC
HCT: 39.2 % (ref 36.0–46.0)
Hemoglobin: 12.3 g/dL (ref 12.0–15.0)
MCH: 30.7 pg (ref 26.0–34.0)
MCHC: 31.4 g/dL (ref 30.0–36.0)
MCV: 97.8 fL (ref 80.0–100.0)
Platelets: 207 10*3/uL (ref 150–400)
RBC: 4.01 MIL/uL (ref 3.87–5.11)
RDW: 14.2 % (ref 11.5–15.5)
WBC: 4.1 10*3/uL (ref 4.0–10.5)
nRBC: 0 % (ref 0.0–0.2)

## 2020-02-14 LAB — BASIC METABOLIC PANEL
Anion gap: 6 (ref 5–15)
BUN: 13 mg/dL (ref 8–23)
CO2: 26 mmol/L (ref 22–32)
Calcium: 8.9 mg/dL (ref 8.9–10.3)
Chloride: 106 mmol/L (ref 98–111)
Creatinine, Ser: 1.06 mg/dL — ABNORMAL HIGH (ref 0.44–1.00)
GFR, Estimated: 54 mL/min — ABNORMAL LOW (ref >60)
Glucose, Bld: 109 mg/dL — ABNORMAL HIGH (ref 70–99)
Potassium: 3.6 mmol/L (ref 3.5–5.1)
Sodium: 138 mmol/L (ref 135–145)

## 2020-02-14 NOTE — Patient Instructions (Addendum)
Medication Instructions:  Continue all current medications.  Labwork:  CBC, BMET - orders given today.   Office will contact with results via phone or letter.    Testing/Procedures: none  Follow-Up: 6-8 weeks   Any Other Special Instructions Will Be Listed Below (If Applicable).  If you need a refill on your cardiac medications before your next appointment, please call your pharmacy.

## 2020-02-16 ENCOUNTER — Telehealth: Payer: Self-pay | Admitting: *Deleted

## 2020-02-16 DIAGNOSIS — I428 Other cardiomyopathies: Secondary | ICD-10-CM

## 2020-02-16 DIAGNOSIS — I5033 Acute on chronic diastolic (congestive) heart failure: Secondary | ICD-10-CM

## 2020-02-16 MED ORDER — SPIRONOLACTONE 25 MG PO TABS: 12.5000 mg | ORAL_TABLET | Freq: Every day | ORAL | 3 refills | Status: DC

## 2020-02-16 MED ORDER — TORSEMIDE 20 MG PO TABS: 40.0000 mg | ORAL_TABLET | Freq: Every day | ORAL | 3 refills | Status: DC

## 2020-02-16 NOTE — Telephone Encounter (Signed)
-----   Message from Jonelle Sidle, MD sent at 02/14/2020  3:40 PM EDT ----- Results reviewed.  Please let her know that hemoglobin remains normal at 12.3, this would argue against any recent significant amount of GI bleeding.  Creatinine relatively stable at 1.06.  Let's plan to start Demadex 40 mg daily with discontinuation of Lasix.  Also start Aldactone 12.5 mg daily.  Keep scheduled follow-up, she should have a BMET in the next 7 to 10 days after medication changes.

## 2020-02-16 NOTE — Telephone Encounter (Signed)
Patient informed and verbalized understanding of plan. Lab order faxed to [facility] Copy sent to PCP 

## 2020-02-20 ENCOUNTER — Telehealth: Payer: Self-pay | Admitting: Cardiology

## 2020-02-20 NOTE — Telephone Encounter (Signed)
Patient called stating that she received her medications today from [facility]. States that the Aldactone & Demadex were not included.

## 2020-02-20 NOTE — Telephone Encounter (Signed)
Pt aware that pharmacy confirmed receipt of torsemide and aldactone on 11/5 and should receive in the next day or so

## 2020-03-12 DIAGNOSIS — I10 Essential (primary) hypertension: Secondary | ICD-10-CM | POA: Diagnosis not present

## 2020-03-12 DIAGNOSIS — Z7901 Long term (current) use of anticoagulants: Secondary | ICD-10-CM | POA: Diagnosis not present

## 2020-03-12 DIAGNOSIS — I2510 Atherosclerotic heart disease of native coronary artery without angina pectoris: Secondary | ICD-10-CM | POA: Diagnosis not present

## 2020-03-12 DIAGNOSIS — I484 Atypical atrial flutter: Secondary | ICD-10-CM | POA: Diagnosis not present

## 2020-04-23 DIAGNOSIS — R519 Headache, unspecified: Secondary | ICD-10-CM | POA: Diagnosis not present

## 2020-04-23 DIAGNOSIS — I10 Essential (primary) hypertension: Secondary | ICD-10-CM | POA: Diagnosis not present

## 2020-04-23 DIAGNOSIS — I484 Atypical atrial flutter: Secondary | ICD-10-CM | POA: Diagnosis not present

## 2020-04-23 DIAGNOSIS — I5032 Chronic diastolic (congestive) heart failure: Secondary | ICD-10-CM | POA: Diagnosis not present

## 2020-04-23 DIAGNOSIS — R42 Dizziness and giddiness: Secondary | ICD-10-CM | POA: Diagnosis not present

## 2020-04-23 DIAGNOSIS — I480 Paroxysmal atrial fibrillation: Secondary | ICD-10-CM | POA: Diagnosis not present

## 2020-04-23 NOTE — Progress Notes (Signed)
Cardiology Office Note  Date: 04/24/2020   ID: Genese, Quebedeaux 1940/11/09, MRN [id number]  PCP:  [name], MD  Cardiologist:  [name], MD Electrophysiologist:  None   Chief Complaint  Patient presents with  . Cardiac follow-up    History of Present Illness: Jillian LOOMER is a 80 y.o. female last seen in November 2021.  She presents for a follow-up visit.  Doing much better in terms of fluid control, her weight is down 13 pounds.  Since last encounter she was switched from Lasix to [facility], also started on low-dose Aldactone.  She had lab work obtained recently by Dr. [name], we are requesting the results.  She does not report any obvious melena or hematochezia.  No orthopnea or PND.  No sense of palpitations.  Past Medical History:  Diagnosis Date  . Anemia   . Cardiomyopathy (HCC)    LVEF 40-45% 2011  . CHF (congestive heart failure) (HCC)   . Chronic insomnia   . Coronary atherosclerosis of native coronary artery    60% LAD in 2002  . DJD (degenerative joint disease)    Left THA  . Essential hypertension   . GERD (gastroesophageal reflux disease)   . Hyperlipidemia   . Hypothyroidism   . Left bundle branch block   . Nephrolithiasis   . Obesity   . Paroxysmal atrial flutter [facility])    Documented January 2018    Past Surgical History:  Procedure Laterality Date  . ABDOMINAL HYSTERECTOMY     (partial)  . APPENDECTOMY    . CHOLECYSTECTOMY    . COLONOSCOPY  Aug 2013   Dr. Loreta Ave, [facility]: small internal hemorrhoids and few scattered sigmoid diverticula, otherwise normal up to TI, TI appeared normal  . ESOPHAGOGASTRODUODENOSCOPY N/A 06/01/2012   RMR:5 cm hiatal hernia. Essentially normal esophagus  . HEMORROIDECTOMY    . JOINT REPLACEMENT    . NOSE SURGERY    . REVISION TOTAL HIP ARTHROPLASTY  2009   Left THA 2009    Current Outpatient Medications  Medication Sig Dispense Refill  . apixaban (ELIQUIS) 5 MG TABS tablet Take  1 tablet (5 mg total) by mouth 2 (two) times daily. 60 tablet 11  . Calcium Carbonate-Vitamin D (CALCIUM 600 + D PO) Take 1 tablet by mouth daily.     Marland Kitchen ENTRESTO 49-51 MG Take 1 tablet by mouth twice daily 60 tablet 11  . gabapentin (NEURONTIN) 100 MG capsule Take 100 mg by mouth at bedtime.    . isosorbide mononitrate (IMDUR) 30 MG 24 hr tablet Take 1 tablet by mouth every day 90 tablet 1  . metoprolol succinate (TOPROL-XL) 100 MG 24 hr tablet Take 1 tablet by mouth every day after a meal 90 tablet 1  . Multiple Vitamin (MULTIVITAMIN WITH MINERALS) TABS Take 1 tablet by mouth daily.    . nitroGLYCERIN (NITROSTAT) 0.4 MG SL tablet Place 1 tablet (0.4 mg total) under the tongue every 5 (five) minutes as needed. For chest pain 25 tablet 3  . omeprazole (PRILOSEC) 20 MG capsule Take 20 mg by mouth every morning.    . pravastatin (PRAVACHOL) 40 MG tablet Take 40 mg by mouth at bedtime.    Marland Kitchen spironolactone (ALDACTONE) 25 MG tablet Take 0.5 tablets (12.5 mg total) by mouth daily. 45 tablet 3  . tamsulosin (FLOMAX) 0.4 MG CAPS capsule Take 1 capsule (0.4 mg total) by mouth daily. 30 capsule 0  . torsemide (DEMADEX) 20 MG tablet Take 1  tablet (20 mg total) by mouth 2 (two) times daily. 180 tablet 3   No current facility-administered medications for this visit.   Allergies:  Codeine and Tape   ROS: No syncope.  Physical Exam: VS:  BP 112/62   Pulse 74   Ht 5\' 3"  (1.6 m)   Wt 200 lb (90.7 kg)   SpO2 92%   BMI 35.43 kg/m , BMI Body mass index is 35.43 kg/m.  Wt Readings from Last 3 Encounters:  04/24/20 200 lb (90.7 kg)  02/14/20 213 lb 3.2 oz (96.7 kg)  01/01/20 205 lb (93 kg)    General: Patient appears comfortable at rest. HEENT: Conjunctiva and lids normal, wearing a mask. Neck: Supple, no elevated JVP or carotid bruits, no thyromegaly. Lungs: Clear to auscultation, nonlabored breathing at rest. Cardiac: Irregularly irregular, no S3, 2/6 systolic murmur at the base.01/03/20 Extremities:  Significantly improved leg edema.  ECG:  An ECG dated 07/03/2019 was personally reviewed today and demonstrated:  Atrial fibrillation with RVR, left bundle branch block.  Recent Labwork: 07/03/2019: B Natriuretic Peptide 255.0 07/04/2019: TSH 3.019 07/06/2019: Magnesium 1.9 02/14/2020: BUN 13; Creatinine, Ser 1.06; Hemoglobin 12.3; Platelets 207; Potassium 3.6; Sodium 138     Component Value Date/Time   CHOL 155 06/17/2009 2016   TRIG 97 06/17/2009 2016   HDL 62 06/17/2009 2016   CHOLHDL 2.5 Ratio 06/17/2009 2016   VLDL 19 06/17/2009 2016   LDLCALC 74 06/17/2009 2016    Other Studies Reviewed Today:  Echocardiogram 12/08/2019: 1. Left ventricular ejection fraction, by estimation, is 35%. The left  ventricle has moderately decreased function. The left ventricle  demonstrates global hypokinesis. There is mild left ventricular  hypertrophy. Left ventricular diastolic parameters are  indeterminate.  2. Right ventricular systolic function is normal. The right ventricular  size is normal. There is moderately elevated pulmonary artery systolic  pressure. The estimated right ventricular systolic pressure is 46.0 mmHg.  3. Left atrial size was severely dilated.  4. Right atrial size was moderately dilated.  5. The mitral valve is abnormal. Moderate mitral valve regurgitation.  6. Tricuspid valve regurgitation is moderate.  7. The aortic valve is tricuspid. Aortic valve regurgitation is not  visualized.  8. The inferior vena cava is normal in size with greater than 50%  respiratory variability, suggesting right atrial pressure of 3 mmHg.   Assessment and Plan:  1. Nonischemic cardiomyopathy, fluid status significantly improved following modification of diuretic regimen.  Weight is down 13 pounds.  Requesting recent follow-up lab work from Dr. 12/10/2019.  For now continue Entresto, Toprol-XL, Imdur, Aldactone, and Demadex.  May ultimately cut back Demadex dose.  Follow-up echocardiogram  just prior to her next visit.  2. Permanent atrial fibrillation and atypical atrial flutter.  CHA2DS2-VASc score is 6.  She remains on Eliquis for stroke prophylaxis, no reported melena or hematochezia.  Heart rate control is adequate on current dose of Toprol-XL.  Medication Adjustments/Labs and Tests Ordered: Current medicines are reviewed at length with the patient today.  Concerns regarding medicines are outlined above.   Tests Ordered: Orders Placed This Encounter  Procedures  . ECHOCARDIOGRAM COMPLETE    Medication Changes: Meds ordered this encounter  Medications  . torsemide (DEMADEX) 20 MG tablet    Sig: Take 1 tablet (20 mg total) by mouth 2 (two) times daily.    Dispense:  180 tablet    Refill:  3    04/24/2020 change in directions    Disposition:  Follow up 3 months  in the [place] office.  Signed, Jonelle Sidle, MD, [facility] 04/24/2020 10:01 AM    [facility] at [facility] [address] [place], [place], [place] [address] Phone: [phone]; Fax: [phone]

## 2020-04-24 ENCOUNTER — Encounter: Payer: Self-pay | Admitting: Cardiology

## 2020-04-24 ENCOUNTER — Encounter: Payer: Self-pay | Admitting: *Deleted

## 2020-04-24 ENCOUNTER — Ambulatory Visit (INDEPENDENT_AMBULATORY_CARE_PROVIDER_SITE_OTHER): Payer: Medicare Other | Admitting: Cardiology

## 2020-04-24 VITALS — BP 112/62 | HR 74 | Ht 63.0 in | Wt 200.0 lb

## 2020-04-24 DIAGNOSIS — I4821 Permanent atrial fibrillation: Secondary | ICD-10-CM | POA: Diagnosis not present

## 2020-04-24 DIAGNOSIS — I428 Other cardiomyopathies: Secondary | ICD-10-CM | POA: Diagnosis not present

## 2020-04-24 MED ORDER — TORSEMIDE 20 MG PO TABS: 20.0000 mg | ORAL_TABLET | Freq: Two times a day (BID) | ORAL | 3 refills | Status: DC

## 2020-04-24 NOTE — Patient Instructions (Addendum)
Medication Instructions:   Your physician has recommended you make the following change in your medication:   Change torsemide to 20 mg in the morning and 20 mg in the late afternoon  Continue other medications the same  Labwork:  none  Testing/Procedures: Your physician has requested that you have an echocardiogram in 3 months. Echocardiography is a painless test that uses sound waves to create images of your heart. It provides your doctor with information about the size and shape of your heart and how well your heart's chambers and valves are working. This procedure takes approximately one hour. There are no restrictions for this procedure.  Follow-Up:  Your physician recommends that you schedule a follow-up appointment in: 3 months.  Any Other Special Instructions Will Be Listed Below (If Applicable).  If you need a refill on your cardiac medications before your next appointment, please call your pharmacy.

## 2020-05-13 DIAGNOSIS — I2510 Atherosclerotic heart disease of native coronary artery without angina pectoris: Secondary | ICD-10-CM | POA: Diagnosis not present

## 2020-05-13 DIAGNOSIS — I484 Atypical atrial flutter: Secondary | ICD-10-CM | POA: Diagnosis not present

## 2020-05-13 DIAGNOSIS — I10 Essential (primary) hypertension: Secondary | ICD-10-CM | POA: Diagnosis not present

## 2020-05-13 DIAGNOSIS — Z7901 Long term (current) use of anticoagulants: Secondary | ICD-10-CM | POA: Diagnosis not present

## 2020-05-27 DIAGNOSIS — M25552 Pain in left hip: Secondary | ICD-10-CM | POA: Diagnosis not present

## 2020-05-27 DIAGNOSIS — I484 Atypical atrial flutter: Secondary | ICD-10-CM | POA: Diagnosis not present

## 2020-05-27 DIAGNOSIS — I5032 Chronic diastolic (congestive) heart failure: Secondary | ICD-10-CM | POA: Diagnosis not present

## 2020-05-27 DIAGNOSIS — Z0000 Encounter for general adult medical examination without abnormal findings: Secondary | ICD-10-CM | POA: Diagnosis not present

## 2020-05-27 DIAGNOSIS — Z7901 Long term (current) use of anticoagulants: Secondary | ICD-10-CM | POA: Diagnosis not present

## 2020-05-27 DIAGNOSIS — I25110 Atherosclerotic heart disease of native coronary artery with unstable angina pectoris: Secondary | ICD-10-CM | POA: Diagnosis not present

## 2020-05-27 DIAGNOSIS — K0889 Other specified disorders of teeth and supporting structures: Secondary | ICD-10-CM | POA: Diagnosis not present

## 2020-06-04 ENCOUNTER — Other Ambulatory Visit: Payer: Self-pay | Admitting: Cardiology

## 2020-06-11 ENCOUNTER — Ambulatory Visit
Admission: EM | Admit: 2020-06-11 | Discharge: 2020-06-11 | Disposition: A | Discharge status: Home / Self Care | Payer: Medicare Other | Attending: Emergency Medicine | Admitting: Emergency Medicine

## 2020-06-11 ENCOUNTER — Other Ambulatory Visit: Payer: Self-pay

## 2020-06-11 ENCOUNTER — Encounter: Payer: Self-pay | Admitting: Emergency Medicine

## 2020-06-11 DIAGNOSIS — Z20828 Contact with and (suspected) exposure to other viral communicable diseases: Secondary | ICD-10-CM

## 2020-06-11 DIAGNOSIS — Z7689 Persons encountering health services in other specified circumstances: Secondary | ICD-10-CM | POA: Diagnosis not present

## 2020-06-11 NOTE — ED Triage Notes (Signed)
Here for covid test only 

## 2020-06-12 LAB — SARS-COV-2, NAA 2 DAY TAT

## 2020-06-12 LAB — NOVEL CORONAVIRUS, NAA: SARS-CoV-2, NAA: DETECTED — AB

## 2020-06-13 ENCOUNTER — Telehealth: Payer: Self-pay | Admitting: Physician Assistant

## 2020-06-13 NOTE — Telephone Encounter (Signed)
Called to discuss with patient about COVID-19 symptoms and the use of one of the available treatments for those with mild to moderate Covid symptoms and at a high risk of hospitalization.  Pt appears to qualify for outpatient treatment due to co-morbid conditions and/or a member of an at-risk group in accordance with the FDA Emergency Use Authorization.    Symptom onset: unknown but tested 3/1  Vaccinated: yes Booster? Yes  Immunocompromised? no Qualifiers: age, CVD, CKD.   Unable to reach pt - left VM  Cline Crock

## 2020-07-24 ENCOUNTER — Other Ambulatory Visit: Payer: Self-pay

## 2020-07-24 ENCOUNTER — Ambulatory Visit (INDEPENDENT_AMBULATORY_CARE_PROVIDER_SITE_OTHER): Payer: Medicare Other

## 2020-07-24 DIAGNOSIS — I428 Other cardiomyopathies: Secondary | ICD-10-CM

## 2020-07-24 LAB — ECHOCARDIOGRAM COMPLETE
AR max vel: 1.86 cm2
AV Area VTI: 2.02 cm2
AV Area mean vel: 1.74 cm2
AV Mean grad: 4 mmHg
AV Peak grad: 6.6 mmHg
Ao pk vel: 1.28 m/s
Area-P 1/2: 5.42 cm2
Calc EF: 32.6 %
MV M vel: 5.45 m/s
MV Peak grad: 118.7 mmHg
Radius: 0.47 cm
S' Lateral: 4.46 cm
Single Plane A2C EF: 30.2 %
Single Plane A4C EF: 29.1 %

## 2020-07-25 ENCOUNTER — Telehealth: Payer: Self-pay | Admitting: *Deleted

## 2020-07-25 NOTE — Telephone Encounter (Signed)
Pt voiced understanding

## 2020-07-25 NOTE — Telephone Encounter (Signed)
-----   Message from Jonelle Sidle, MD sent at 07/24/2020  4:26 PM EDT ----- LVEF is stable at 30-35%, moderate mitral regurgitation. Continue current medications and keep follow-up.

## 2020-07-28 NOTE — Progress Notes (Signed)
Cardiology Office Note  Date: 07/29/2020   ID: [name], DOB November 13, 1940, MRN [id number]  PCP:  [name], MD  Cardiologist:  [name], MD Electrophysiologist:  None   Chief Complaint  Patient presents with  . Cardiac follow-up    History of Present Illness: [name] is a 80 y.o. female last seen in January.  She presents for a routine follow-up visit.  Reports NYHA class II-III dyspnea, no reproducible exertional chest pain.  Weight has been stable.  She has chronic leg swelling and some lymphedema.  Follow-up echocardiogram done recently indicated LVEF 30 to 35% with restrictive diastolic filling pattern, normal RV contraction, severe biatrial enlargement, moderate mitral regurgitation, and mild to moderate tricuspid regurgitation.  We went over her medications today, discussed addition of SGLT2 inhibitor.  I personally reviewed her ECG which shows rate controlled atrial flutter with 4-1 block and left bundle branch block.  Past Medical History:  Diagnosis Date  . Anemia   . Cardiomyopathy (HCC)    LVEF 40-45% 2011  . CHF (congestive heart failure) (HCC)   . Chronic insomnia   . Coronary atherosclerosis of native coronary artery    60% LAD in 2002  . DJD (degenerative joint disease)    Left THA  . Essential hypertension   . GERD (gastroesophageal reflux disease)   . Hyperlipidemia   . Hypothyroidism   . Left bundle branch block   . Nephrolithiasis   . Obesity   . Paroxysmal atrial flutter [facility])    Documented January 2018    Past Surgical History:  Procedure Laterality Date  . ABDOMINAL HYSTERECTOMY     (partial)  . APPENDECTOMY    . CHOLECYSTECTOMY    . COLONOSCOPY  Aug 2013   Dr. Loreta Ave, [facility]: small internal hemorrhoids and few scattered sigmoid diverticula, otherwise normal up to TI, TI appeared normal  . ESOPHAGOGASTRODUODENOSCOPY N/A 06/01/2012   RMR:5 cm hiatal hernia. Essentially normal esophagus  .  HEMORROIDECTOMY    . JOINT REPLACEMENT    . NOSE SURGERY    . REVISION TOTAL HIP ARTHROPLASTY  2009   Left THA 2009    Current Outpatient Medications  Medication Sig Dispense Refill  . apixaban (ELIQUIS) 5 MG TABS tablet Take 1 tablet (5 mg total) by mouth 2 (two) times daily. 60 tablet 11  . Calcium Carbonate-Vitamin D (CALCIUM 600 + D PO) Take 1 tablet by mouth daily.     . dapagliflozin propanediol (FARXIGA) 10 MG TABS tablet Take 1 tablet (10 mg total) by mouth daily. 30 tablet 6  . ENTRESTO 49-51 MG Take 1 tablet by mouth twice daily 60 tablet 11  . gabapentin (NEURONTIN) 100 MG capsule Take 100 mg by mouth at bedtime.    . isosorbide mononitrate (IMDUR) 30 MG 24 hr tablet Take 1 tablet by mouth every day 30 tablet 11  . metoprolol succinate (TOPROL-XL) 100 MG 24 hr tablet Take 1 tablet by mouth every day after a meal 30 tablet 11  . Multiple Vitamin (MULTIVITAMIN WITH MINERALS) TABS Take 1 tablet by mouth daily.    . nitroGLYCERIN (NITROSTAT) 0.4 MG SL tablet Place 1 tablet (0.4 mg total) under the tongue every 5 (five) minutes as needed. For chest pain 25 tablet 3  . omeprazole (PRILOSEC) 20 MG capsule Take 20 mg by mouth every morning.    . pravastatin (PRAVACHOL) 40 MG tablet Take 40 mg by mouth at bedtime.    Marland Kitchen spironolactone (ALDACTONE) 25 MG  tablet Take 0.5 tablets (12.5 mg total) by mouth daily. 45 tablet 3  . tamsulosin (FLOMAX) 0.4 MG CAPS capsule Take 1 capsule (0.4 mg total) by mouth daily. 30 capsule 0  . torsemide (DEMADEX) 20 MG tablet Take 1 tablet (20 mg total) by mouth 2 (two) times daily. 180 tablet 3   No current facility-administered medications for this visit.   Allergies:  Codeine and Tape   ROS: No palpitations or syncope.  Physical Exam: VS:  BP 126/78   Pulse 76   Ht 5\' 3"  (1.6 m)   Wt 199 lb (90.3 kg)   SpO2 99%   BMI 35.25 kg/m , BMI Body mass index is 35.25 kg/m.  Wt Readings from Last 3 Encounters:  07/29/20 199 lb (90.3 kg)  04/24/20 200  lb (90.7 kg)  02/14/20 213 lb 3.2 oz (96.7 kg)    General: Patient appears comfortable at rest. HEENT: Conjunctiva and lids normal, wearing a mask. Neck: Supple, no elevated JVP or carotid bruits, no thyromegaly. Lungs: Clear to auscultation, nonlabored breathing at rest. Cardiac: Largely regular today, no S3, 2/6 systolic murmur, no pericardial rub. Abdomen: Soft, bowel sounds present. Extremities: Chronic appearing lower leg edema/lymphedema.  ECG:  An ECG dated 07/03/2019 was personally reviewed today and demonstrated:  Atrial fibrillation with RVR, left bundle branch block.  Recent Labwork: 02/14/2020: BUN 13; Creatinine, Ser 1.06; Hemoglobin 12.3; Platelets 207; Potassium 3.6; Sodium 138     Component Value Date/Time   CHOL 155 06/17/2009 2016   TRIG 97 06/17/2009 2016   HDL 62 06/17/2009 2016   CHOLHDL 2.5 Ratio 06/17/2009 2016   VLDL 19 06/17/2009 2016   LDLCALC 74 06/17/2009 2016    Other Studies Reviewed Today:  Echocardiogram 07/24/2020: 1. Left ventricular ejection fraction, by estimation, is 30 to 35%. The  left ventricle has moderately decreased function. The left ventricle  demonstrates global hypokinesis. Left ventricular diastolic parameters are  consistent with Grade III diastolic  dysfunction (restrictive). Elevated left atrial pressure. The average left  ventricular global longitudinal strain is 13.8 %. The global longitudinal  strain is abnormal.  2. Right ventricular systolic function is low normal. The right  ventricular size is mildly enlarged.  3. Left atrial size was severely dilated.  4. Right atrial size was severely dilated.  5. The pericardial effusion is circumferential.  6. The mitral valve is abnormal. Moderate mitral valve regurgitation. No  evidence of mitral stenosis.  7. The tricuspid valve is abnormal. Tricuspid valve regurgitation is mild  to moderate.  8. The aortic valve is tricuspid. There is mild calcification of the  aortic  valve. There is mild thickening of the aortic valve. Aortic valve  regurgitation is not visualized. No aortic stenosis is present.  9. The inferior vena cava is normal in size with greater than 50%  respiratory variability, suggesting right atrial pressure of 3 mmHg.   Assessment and Plan:  1.  Nonischemic cardiomyopathy with LVEF 30 to 35% and restrictive diastolic filling pattern, normal RV contraction.  Plan to continue Toprol-XL, Aldactone, Demadex, and Entresto.  Starting Farxiga 10 mg daily.  Follow-up BMET for next visit.  2.  Permanent atrial fibrillation and persistent atrial flutter with CHA2DS2-VASc score of 6.  She is symptomatically stable with controlled heart rate today.  Continue Toprol-XL and Eliquis.  3.  CAD with previously documented moderate LAD disease being managed medically.  She continues on Imdur and Pravachol.  Medication Adjustments/Labs and Tests Ordered: Current medicines are reviewed at length  with the patient today.  Concerns regarding medicines are outlined above.   Tests Ordered: Orders Placed This Encounter  Procedures  . EKG 12-Lead    Medication Changes: Meds ordered this encounter  Medications  . dapagliflozin propanediol (FARXIGA) 10 MG TABS tablet    Sig: Take 1 tablet (10 mg total) by mouth daily.    Dispense:  30 tablet    Refill:  6    New 07/29/2020    Disposition:  Follow up 3 months in the [place] office.  Signed, Jonelle Sidle, MD, [facility] 07/29/2020 10:03 AM    [facility] at [facility] [address] [place], [place], [place] [address] Phone: [phone]; Fax: [phone]

## 2020-07-29 ENCOUNTER — Ambulatory Visit (INDEPENDENT_AMBULATORY_CARE_PROVIDER_SITE_OTHER): Payer: Medicare Other | Admitting: Cardiology

## 2020-07-29 ENCOUNTER — Encounter: Payer: Self-pay | Admitting: Cardiology

## 2020-07-29 ENCOUNTER — Other Ambulatory Visit: Payer: Self-pay

## 2020-07-29 VITALS — BP 126/78 | HR 76 | Ht 63.0 in | Wt 199.0 lb

## 2020-07-29 DIAGNOSIS — I4821 Permanent atrial fibrillation: Secondary | ICD-10-CM

## 2020-07-29 DIAGNOSIS — I5033 Acute on chronic diastolic (congestive) heart failure: Secondary | ICD-10-CM

## 2020-07-29 DIAGNOSIS — I428 Other cardiomyopathies: Secondary | ICD-10-CM | POA: Diagnosis not present

## 2020-07-29 DIAGNOSIS — I25119 Atherosclerotic heart disease of native coronary artery with unspecified angina pectoris: Secondary | ICD-10-CM | POA: Diagnosis not present

## 2020-07-29 DIAGNOSIS — I10 Essential (primary) hypertension: Secondary | ICD-10-CM

## 2020-07-29 MED ORDER — DAPAGLIFLOZIN PROPANEDIOL 10 MG PO TABS: 10.0000 mg | ORAL_TABLET | Freq: Every day | ORAL | 6 refills | Status: DC

## 2020-07-29 NOTE — Patient Instructions (Signed)
Medication Instructions:   Begin Farxiga 10mg  daily.   Continue all other medications.    Labwork:  BMET - order given today.   Please do in 3 months, just prior to next office visit.   Testing/Procedures: none  Follow-Up: 3 months   Any Other Special Instructions Will Be Listed Below (If Applicable).  If you need a refill on your cardiac medications before your next appointment, please call your pharmacy.

## 2020-08-10 DIAGNOSIS — I10 Essential (primary) hypertension: Secondary | ICD-10-CM | POA: Diagnosis not present

## 2020-08-10 DIAGNOSIS — I2510 Atherosclerotic heart disease of native coronary artery without angina pectoris: Secondary | ICD-10-CM | POA: Diagnosis not present

## 2020-08-10 DIAGNOSIS — I484 Atypical atrial flutter: Secondary | ICD-10-CM | POA: Diagnosis not present

## 2020-08-10 DIAGNOSIS — Z7901 Long term (current) use of anticoagulants: Secondary | ICD-10-CM | POA: Diagnosis not present

## 2020-08-19 DIAGNOSIS — I110 Hypertensive heart disease with heart failure: Secondary | ICD-10-CM | POA: Diagnosis not present

## 2020-08-19 DIAGNOSIS — I5032 Chronic diastolic (congestive) heart failure: Secondary | ICD-10-CM | POA: Diagnosis not present

## 2020-08-19 DIAGNOSIS — I10 Essential (primary) hypertension: Secondary | ICD-10-CM | POA: Diagnosis not present

## 2020-08-19 DIAGNOSIS — I428 Other cardiomyopathies: Secondary | ICD-10-CM | POA: Diagnosis not present

## 2020-08-19 DIAGNOSIS — I5033 Acute on chronic diastolic (congestive) heart failure: Secondary | ICD-10-CM | POA: Diagnosis not present

## 2020-08-19 DIAGNOSIS — I4821 Permanent atrial fibrillation: Secondary | ICD-10-CM | POA: Diagnosis not present

## 2020-08-19 DIAGNOSIS — R10816 Epigastric abdominal tenderness: Secondary | ICD-10-CM | POA: Diagnosis not present

## 2020-08-19 DIAGNOSIS — I25119 Atherosclerotic heart disease of native coronary artery with unspecified angina pectoris: Secondary | ICD-10-CM | POA: Diagnosis not present

## 2020-08-19 DIAGNOSIS — M25552 Pain in left hip: Secondary | ICD-10-CM | POA: Diagnosis not present

## 2020-10-10 DIAGNOSIS — I2510 Atherosclerotic heart disease of native coronary artery without angina pectoris: Secondary | ICD-10-CM | POA: Diagnosis not present

## 2020-10-10 DIAGNOSIS — I484 Atypical atrial flutter: Secondary | ICD-10-CM | POA: Diagnosis not present

## 2020-10-10 DIAGNOSIS — I10 Essential (primary) hypertension: Secondary | ICD-10-CM | POA: Diagnosis not present

## 2020-10-10 DIAGNOSIS — Z7901 Long term (current) use of anticoagulants: Secondary | ICD-10-CM | POA: Diagnosis not present

## 2020-11-04 ENCOUNTER — Other Ambulatory Visit (HOSPITAL_COMMUNITY)
Admission: RE | Admit: 2020-11-04 | Discharge: 2020-11-04 | Disposition: A | Discharge status: Home / Self Care | Payer: Medicare Other | Source: Ambulatory Visit | Attending: Cardiology | Admitting: Cardiology

## 2020-11-04 ENCOUNTER — Other Ambulatory Visit: Payer: Self-pay

## 2020-11-04 DIAGNOSIS — I5033 Acute on chronic diastolic (congestive) heart failure: Secondary | ICD-10-CM | POA: Insufficient documentation

## 2020-11-04 DIAGNOSIS — I10 Essential (primary) hypertension: Secondary | ICD-10-CM | POA: Diagnosis not present

## 2020-11-04 LAB — BASIC METABOLIC PANEL
Anion gap: 6 (ref 5–15)
BUN: 33 mg/dL — ABNORMAL HIGH (ref 8–23)
CO2: 25 mmol/L (ref 22–32)
Calcium: 9.6 mg/dL (ref 8.9–10.3)
Chloride: 106 mmol/L (ref 98–111)
Creatinine, Ser: 1.57 mg/dL — ABNORMAL HIGH (ref 0.44–1.00)
GFR, Estimated: 34 mL/min — ABNORMAL LOW (ref >60)
Glucose, Bld: 104 mg/dL — ABNORMAL HIGH (ref 70–99)
Potassium: 5.4 mmol/L — ABNORMAL HIGH (ref 3.5–5.1)
Sodium: 137 mmol/L (ref 135–145)

## 2020-11-04 NOTE — Addendum Note (Signed)
Addended by: Kerney Elbe on: 11/04/2020 08:24 AM   Modules accepted: Orders

## 2020-11-06 NOTE — Progress Notes (Signed)
Cardiology Office Note  Date: 11/07/2020   ID: Jillian, Evans [birth year], MRN [id number]  PCP:  Jillian Mires, MD  Cardiologist:  Jillian Dell, MD Electrophysiologist:  None   Chief Complaint  Patient presents with   Cardiac follow-up    History of Present Illness: [name] is a 80 y.o. female last seen in April.  She is here for a follow-up visit.  Overall relatively stable with NYHA class II-III dyspnea.  Continues to have fluctuating leg edema and lymphedema.  I reviewed her recent lab work and recommended cutting Demadex dose in half.  She was started on Farxiga at the last visit.  We have decided to go with 20 mg one day and 40 mg the next for now.  The remainder of her medications are stable.  She has follow-up with her PCP next week.  Past Medical History:  Diagnosis Date   Anemia    Cardiomyopathy (HCC)    LVEF 40-45% 2011   CHF (congestive heart failure) (HCC)    Chronic insomnia    Coronary atherosclerosis of native coronary artery    60% LAD in 2002   DJD (degenerative joint disease)    Left THA   Essential hypertension    GERD (gastroesophageal reflux disease)    Hyperlipidemia    Hypothyroidism    Left bundle branch block    Nephrolithiasis    Obesity    Paroxysmal atrial flutter [facility])    Documented January 2018    Past Surgical History:  Procedure Laterality Date   ABDOMINAL HYSTERECTOMY     (partial)   APPENDECTOMY     CHOLECYSTECTOMY     COLONOSCOPY  Aug 2013   Dr. Loreta Ave, [facility]: small internal hemorrhoids and few scattered sigmoid diverticula, otherwise normal up to TI, TI appeared normal   ESOPHAGOGASTRODUODENOSCOPY N/A 06/01/2012   RMR:5 cm hiatal hernia. Essentially normal esophagus   HEMORROIDECTOMY     JOINT REPLACEMENT     NOSE SURGERY     REVISION TOTAL HIP ARTHROPLASTY  2009   Left THA 2009    Current Outpatient Medications  Medication Sig Dispense Refill   apixaban (ELIQUIS) 5 MG TABS  tablet Take 1 tablet (5 mg total) by mouth 2 (two) times daily. 60 tablet 11   Calcium Carbonate-Vitamin D (CALCIUM 600 + D PO) Take 1 tablet by mouth daily.      ENTRESTO 49-51 MG Take 1 tablet by mouth twice daily 60 tablet 11   gabapentin (NEURONTIN) 100 MG capsule Take 100 mg by mouth at bedtime.     isosorbide mononitrate (IMDUR) 30 MG 24 hr tablet Take 1 tablet by mouth every day 30 tablet 11   metoprolol succinate (TOPROL-XL) 100 MG 24 hr tablet Take 1 tablet by mouth every day after a meal 30 tablet 11   Multiple Vitamin (MULTIVITAMIN WITH MINERALS) TABS Take 1 tablet by mouth daily.     nitroGLYCERIN (NITROSTAT) 0.4 MG SL tablet Place 1 tablet (0.4 mg total) under the tongue every 5 (five) minutes as needed. For chest pain 25 tablet 3   omeprazole (PRILOSEC) 20 MG capsule Take 20 mg by mouth every morning.     pravastatin (PRAVACHOL) 40 MG tablet Take 40 mg by mouth at bedtime.     spironolactone (ALDACTONE) 25 MG tablet Take 0.5 tablets (12.5 mg total) by mouth daily. 45 tablet 3   tamsulosin (FLOMAX) 0.4 MG CAPS capsule Take 1 capsule (0.4 mg total) by mouth daily. 30  capsule 0   dapagliflozin propanediol (FARXIGA) 10 MG TABS tablet Take 1 tablet (10 mg total) by mouth daily. 30 tablet 6   torsemide (DEMADEX) 20 MG tablet Take 1-2 tablets (20-40 mg total) by mouth daily. Alternate 20 mg & 40 mg by mouth daily 135 tablet 1   No current facility-administered medications for this visit.   Allergies:  Codeine and Tape   ROS: No palpitations or syncope.  Physical Exam: VS:  BP 100/62   Pulse (!) 53   Ht 5\' 3"  (1.6 m)   Wt 204 lb 6.4 oz (92.7 kg)   SpO2 97%   BMI 36.21 kg/m , BMI Body mass index is 36.21 kg/m.  Wt Readings from Last 3 Encounters:  11/07/20 204 lb 6.4 oz (92.7 kg)  07/29/20 199 lb (90.3 kg)  04/24/20 200 lb (90.7 kg)    General: Patient appears comfortable at rest. HEENT: Conjunctiva and lids normal, wearing a mask. Neck: Supple, no elevated JVP or carotid  bruits, no thyromegaly. Lungs: Clear to auscultation, nonlabored breathing at rest. Cardiac: Irregular, no S3, 2/6 systolic murmur, no pericardial rub. Extremities: Chronic appearing leg edema and lymphedema.  ECG:  An ECG dated 07/29/2020 was personally reviewed today and demonstrated:  Atrial flutter with 4:1 block and left bundle branch block.  Recent Labwork: 02/14/2020: Hemoglobin 12.3; Platelets 207 11/04/2020: BUN 33; Creatinine, Ser 1.57; Potassium 5.4; Sodium 137   Other Studies Reviewed Today:  Echocardiogram 07/24/2020:  1. Left ventricular ejection fraction, by estimation, is 30 to 35%. The  left ventricle has moderately decreased function. The left ventricle  demonstrates global hypokinesis. Left ventricular diastolic parameters are  consistent with Grade III diastolic  dysfunction (restrictive). Elevated left atrial pressure. The average left  ventricular global longitudinal strain is 13.8 %. The global longitudinal  strain is abnormal.   2. Right ventricular systolic function is low normal. The right  ventricular size is mildly enlarged.   3. Left atrial size was severely dilated.   4. Right atrial size was severely dilated.   5. The pericardial effusion is circumferential.   6. The mitral valve is abnormal. Moderate mitral valve regurgitation. No  evidence of mitral stenosis.   7. The tricuspid valve is abnormal. Tricuspid valve regurgitation is mild  to moderate.   8. The aortic valve is tricuspid. There is mild calcification of the  aortic valve. There is mild thickening of the aortic valve. Aortic valve  regurgitation is not visualized. No aortic stenosis is present.   9. The inferior vena cava is normal in size with greater than 50%  respiratory variability, suggesting right atrial pressure of 3 mmHg.  Assessment and Plan:  1.  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  Plan to continue medical management.  Continue  Toprol-XL, Entresto, Aldactone, and 07/26/2020.  Change Demadex to 20 mg one day alternating with 40 mg the next.  Recheck BMET for next visit.  2.  CKD stage IIIb, last creatinine 1.57 with high normal potassium.  3.  Permanent atrial fibrillation/flutter with CHA2DS2-VASc score of 6.  Heart rate has been well controlled.  Continue Toprol-XL as well as Eliquis for stroke prophylaxis.  4.  CAD, previously documented moderate LAD stenosis managed medically.  She does not describe any angina and continues on Imdur along with Pravachol.  Medication Adjustments/Labs and Tests Ordered: Current medicines are reviewed at length with the patient today.  Concerns regarding medicines are outlined above.   Tests Ordered: Orders  Placed This Encounter  Procedures   Basic metabolic panel     Medication Changes: Meds ordered this encounter  Medications   dapagliflozin propanediol (FARXIGA) 10 MG TABS tablet    Sig: Take 1 tablet (10 mg total) by mouth daily.    Dispense:  30 tablet    Refill:  6    New 07/29/2020   torsemide (DEMADEX) 20 MG tablet    Sig: Take 1-2 tablets (20-40 mg total) by mouth daily. Alternate 20 mg & 40 mg by mouth daily    Dispense:  135 tablet    Refill:  1    11/07/2020 dose decrease    Disposition:  Follow up  6 months.  Signed, Jonelle Sidle, MD, [facility] 11/07/2020 9:47 AM    [facility] at [facility] [address] [place], [place], [place] [address] Phone: [phone]; Fax: [phone]

## 2020-11-07 ENCOUNTER — Other Ambulatory Visit: Payer: Self-pay

## 2020-11-07 ENCOUNTER — Ambulatory Visit (INDEPENDENT_AMBULATORY_CARE_PROVIDER_SITE_OTHER): Payer: Medicare Other | Admitting: Cardiology

## 2020-11-07 ENCOUNTER — Encounter: Payer: Self-pay | Admitting: Cardiology

## 2020-11-07 VITALS — BP 100/62 | HR 53 | Ht 63.0 in | Wt 204.4 lb

## 2020-11-07 DIAGNOSIS — Z79899 Other long term (current) drug therapy: Secondary | ICD-10-CM | POA: Diagnosis not present

## 2020-11-07 DIAGNOSIS — I5042 Chronic combined systolic (congestive) and diastolic (congestive) heart failure: Secondary | ICD-10-CM

## 2020-11-07 DIAGNOSIS — I428 Other cardiomyopathies: Secondary | ICD-10-CM | POA: Diagnosis not present

## 2020-11-07 DIAGNOSIS — I4821 Permanent atrial fibrillation: Secondary | ICD-10-CM | POA: Diagnosis not present

## 2020-11-07 DIAGNOSIS — I25119 Atherosclerotic heart disease of native coronary artery with unspecified angina pectoris: Secondary | ICD-10-CM | POA: Diagnosis not present

## 2020-11-07 MED ORDER — DAPAGLIFLOZIN PROPANEDIOL 10 MG PO TABS: 10.0000 mg | ORAL_TABLET | Freq: Every day | ORAL | 6 refills | Status: DC

## 2020-11-07 MED ORDER — TORSEMIDE 20 MG PO TABS: 20.0000 mg | ORAL_TABLET | Freq: Every day | ORAL | 1 refills | Status: DC

## 2020-11-07 NOTE — Patient Instructions (Addendum)
Medication Instructions:  Decrease torsemide to 20 mg alternating with 40 mg daily Continue other medications the same  Labwork: BMET in 6 months just before your next visit Non-fasting-can be done at [facility]  Testing/Procedures: none  Follow-Up: Your physician recommends that you schedule a follow-up appointment in: 6 months  Any Other Special Instructions Will Be Listed Below (If Applicable).  If you need a refill on your cardiac medications before your next appointment, please call your pharmacy.

## 2020-11-11 DIAGNOSIS — I484 Atypical atrial flutter: Secondary | ICD-10-CM | POA: Diagnosis not present

## 2020-11-11 DIAGNOSIS — I5032 Chronic diastolic (congestive) heart failure: Secondary | ICD-10-CM | POA: Diagnosis not present

## 2020-11-11 DIAGNOSIS — I428 Other cardiomyopathies: Secondary | ICD-10-CM | POA: Diagnosis not present

## 2020-11-11 DIAGNOSIS — R10816 Epigastric abdominal tenderness: Secondary | ICD-10-CM | POA: Diagnosis not present

## 2020-11-11 DIAGNOSIS — I25119 Atherosclerotic heart disease of native coronary artery with unspecified angina pectoris: Secondary | ICD-10-CM | POA: Diagnosis not present

## 2020-11-19 ENCOUNTER — Other Ambulatory Visit: Payer: Self-pay | Admitting: Family Medicine

## 2020-11-19 ENCOUNTER — Other Ambulatory Visit (HOSPITAL_COMMUNITY): Payer: Self-pay | Admitting: Family Medicine

## 2020-11-19 DIAGNOSIS — R7989 Other specified abnormal findings of blood chemistry: Secondary | ICD-10-CM

## 2020-11-24 ENCOUNTER — Encounter: Payer: Self-pay | Admitting: Gastroenterology

## 2020-11-24 NOTE — Progress Notes (Signed)
Patient called today requesting to change to a virtual visit due to having a fever and feeling sick.  Clinical staff attempted to call patient several times at the time of her appointment (1:30 PM).  Unfortunately, we are unable to connect with the patient.  She is going to need to be rescheduled.  Ermalinda Memos, PA-C [facility]

## 2020-11-25 ENCOUNTER — Other Ambulatory Visit: Payer: Self-pay

## 2020-11-25 ENCOUNTER — Encounter: Payer: Self-pay | Admitting: Internal Medicine

## 2020-11-25 ENCOUNTER — Telehealth: Payer: Self-pay | Admitting: *Deleted

## 2020-11-25 ENCOUNTER — Ambulatory Visit (INDEPENDENT_AMBULATORY_CARE_PROVIDER_SITE_OTHER): Payer: Self-pay | Admitting: Gastroenterology

## 2020-11-25 DIAGNOSIS — R109 Unspecified abdominal pain: Secondary | ICD-10-CM

## 2020-11-25 NOTE — Telephone Encounter (Signed)
Pt consented to a telephone visit. °

## 2020-11-25 NOTE — Telephone Encounter (Signed)
Called home # but call would not go through. Called mobile no answer and not able to leave VM. No call back.  Also per Baxter Hire, patient will need to be video visit if patient reschedules and can't come in. Not seen in over 3 years.

## 2020-11-25 NOTE — Telephone Encounter (Signed)
[name], you are scheduled for a virtual visit with your provider today.  Just as we do with appointments in the office, we must obtain your consent to participate.  Your consent will be active for this visit and any virtual visit you may have with one of our providers in the next 365 days.  If you have a MyChart account, I can also send a copy of this consent to you electronically.  All virtual visits are billed to your insurance company just like a traditional visit in the office.  As this is a virtual visit, video technology does not allow for your provider to perform a traditional examination.  This may limit your provider's ability to fully assess your condition.  If your provider identifies any concerns that need to be evaluated in person or the need to arrange testing such as labs, EKG, etc, we will make arrangements to do so.  Although advances in technology are sophisticated, we cannot ensure that it will always work on either your end or our end.  If the connection with a video visit is poor, we may have to switch to a telephone visit.  With either a video or telephone visit, we are not always able to ensure that we have a secure connection.   I need to obtain your verbal consent now.   Are you willing to proceed with your visit today?

## 2020-11-26 ENCOUNTER — Ambulatory Visit (HOSPITAL_COMMUNITY)
Admission: RE | Admit: 2020-11-26 | Discharge: 2020-11-26 | Disposition: A | Discharge status: Home / Self Care | Payer: Medicare Other | Source: Ambulatory Visit | Attending: Family Medicine | Admitting: Family Medicine

## 2020-11-26 ENCOUNTER — Other Ambulatory Visit: Payer: Self-pay

## 2020-11-26 DIAGNOSIS — R946 Abnormal results of thyroid function studies: Secondary | ICD-10-CM | POA: Diagnosis not present

## 2020-11-26 DIAGNOSIS — R7989 Other specified abnormal findings of blood chemistry: Secondary | ICD-10-CM | POA: Diagnosis not present

## 2020-11-26 DIAGNOSIS — I25119 Atherosclerotic heart disease of native coronary artery with unspecified angina pectoris: Secondary | ICD-10-CM | POA: Insufficient documentation

## 2020-11-26 DIAGNOSIS — R10816 Epigastric abdominal tenderness: Secondary | ICD-10-CM | POA: Insufficient documentation

## 2020-11-26 DIAGNOSIS — I428 Other cardiomyopathies: Secondary | ICD-10-CM | POA: Diagnosis not present

## 2020-11-26 DIAGNOSIS — I4891 Unspecified atrial fibrillation: Secondary | ICD-10-CM | POA: Diagnosis not present

## 2020-11-29 ENCOUNTER — Other Ambulatory Visit: Payer: Self-pay | Admitting: Cardiology

## 2020-11-29 NOTE — Telephone Encounter (Signed)
Prescription refill request for Eliquis received. Indication: afib  Last office visit: [place], 11/07/2020 Scr: 1.57, 11/04/2020 Age: 80 yo  Weight: 92.7 kg   Refill sent.

## 2020-12-04 ENCOUNTER — Ambulatory Visit (INDEPENDENT_AMBULATORY_CARE_PROVIDER_SITE_OTHER): Payer: Medicare Other | Admitting: Podiatry

## 2020-12-04 ENCOUNTER — Other Ambulatory Visit: Payer: Self-pay

## 2020-12-04 ENCOUNTER — Encounter: Payer: Self-pay | Admitting: Podiatry

## 2020-12-04 ENCOUNTER — Ambulatory Visit (INDEPENDENT_AMBULATORY_CARE_PROVIDER_SITE_OTHER): Payer: Medicare Other

## 2020-12-04 DIAGNOSIS — M779 Enthesopathy, unspecified: Secondary | ICD-10-CM

## 2020-12-04 DIAGNOSIS — M79672 Pain in left foot: Secondary | ICD-10-CM

## 2020-12-04 DIAGNOSIS — L600 Ingrowing nail: Secondary | ICD-10-CM

## 2020-12-04 NOTE — Progress Notes (Signed)
Subjective:   Patient ID: [name], female   DOB: 80 y.o.   MRN: [id number]   HPI Patient presents stating she injured her left foot and its been very sore and she is worried she may have broken something she wants it checked   ROS      Objective:  Physical Exam  Neurovascular status intact with significant edema and swelling around the left lateral foot and into the ankle and lower leg localized with negative Denna Haggard' sign noted with also nail disease with nails that are ingrown on the big toes and she like them removed at 1 point in future     Assessment:  Traumatized left foot with possibility for fracture along with chronic nail disease hallux bilateral with pain     Plan:  H&P reviewed both conditions and for the inflammation I have recommended heat therapy ice therapy and it should get better but may take another 4 to 6 weeks and for the nails I did discuss nail removal and at 1 point in future that will be done  X-rays indicate that there is no signs of fracture or diastases injury appears to be soft tissue and I did discuss recovery from nail surgery educating her on this today

## 2020-12-06 ENCOUNTER — Other Ambulatory Visit: Payer: Self-pay | Admitting: Podiatry

## 2020-12-06 ENCOUNTER — Other Ambulatory Visit: Payer: Self-pay | Admitting: Cardiology

## 2020-12-06 DIAGNOSIS — M779 Enthesopathy, unspecified: Secondary | ICD-10-CM

## 2020-12-17 ENCOUNTER — Other Ambulatory Visit: Payer: Self-pay

## 2020-12-17 ENCOUNTER — Emergency Department (HOSPITAL_COMMUNITY): Payer: Medicare Other

## 2020-12-17 ENCOUNTER — Telehealth: Payer: Self-pay | Admitting: Orthopedic Surgery

## 2020-12-17 ENCOUNTER — Other Ambulatory Visit: Payer: Self-pay | Admitting: Cardiology

## 2020-12-17 ENCOUNTER — Emergency Department (HOSPITAL_COMMUNITY)
Admission: EM | Admit: 2020-12-17 | Discharge: 2020-12-17 | Disposition: A | Discharge status: Home / Self Care | Payer: Medicare Other | Attending: Emergency Medicine | Admitting: Emergency Medicine

## 2020-12-17 ENCOUNTER — Encounter (HOSPITAL_COMMUNITY): Payer: Self-pay | Admitting: *Deleted

## 2020-12-17 DIAGNOSIS — I110 Hypertensive heart disease with heart failure: Secondary | ICD-10-CM | POA: Diagnosis not present

## 2020-12-17 DIAGNOSIS — Z96642 Presence of left artificial hip joint: Secondary | ICD-10-CM | POA: Diagnosis not present

## 2020-12-17 DIAGNOSIS — W010XXA Fall on same level from slipping, tripping and stumbling without subsequent striking against object, initial encounter: Secondary | ICD-10-CM | POA: Insufficient documentation

## 2020-12-17 DIAGNOSIS — I2510 Atherosclerotic heart disease of native coronary artery without angina pectoris: Secondary | ICD-10-CM | POA: Insufficient documentation

## 2020-12-17 DIAGNOSIS — S8001XA Contusion of right knee, initial encounter: Secondary | ICD-10-CM | POA: Diagnosis not present

## 2020-12-17 DIAGNOSIS — I509 Heart failure, unspecified: Secondary | ICD-10-CM | POA: Diagnosis not present

## 2020-12-17 DIAGNOSIS — Z7901 Long term (current) use of anticoagulants: Secondary | ICD-10-CM | POA: Diagnosis not present

## 2020-12-17 DIAGNOSIS — Z79899 Other long term (current) drug therapy: Secondary | ICD-10-CM | POA: Insufficient documentation

## 2020-12-17 DIAGNOSIS — E039 Hypothyroidism, unspecified: Secondary | ICD-10-CM | POA: Diagnosis not present

## 2020-12-17 DIAGNOSIS — M25461 Effusion, right knee: Secondary | ICD-10-CM | POA: Diagnosis not present

## 2020-12-17 DIAGNOSIS — M1711 Unilateral primary osteoarthritis, right knee: Secondary | ICD-10-CM | POA: Diagnosis not present

## 2020-12-17 DIAGNOSIS — M7981 Nontraumatic hematoma of soft tissue: Secondary | ICD-10-CM | POA: Diagnosis not present

## 2020-12-17 DIAGNOSIS — M7989 Other specified soft tissue disorders: Secondary | ICD-10-CM | POA: Diagnosis not present

## 2020-12-17 LAB — CBC WITH DIFFERENTIAL/PLATELET
Abs Immature Granulocytes: 0.04 10*3/uL (ref 0.00–0.07)
Basophils Absolute: 0 10*3/uL (ref 0.0–0.1)
Basophils Relative: 1 %
Eosinophils Absolute: 0.1 10*3/uL (ref 0.0–0.5)
Eosinophils Relative: 2 %
HCT: 31.1 % — ABNORMAL LOW (ref 36.0–46.0)
Hemoglobin: 9.7 g/dL — ABNORMAL LOW (ref 12.0–15.0)
Immature Granulocytes: 1 %
Lymphocytes Relative: 32 %
Lymphs Abs: 1.9 10*3/uL (ref 0.7–4.0)
MCH: 31.8 pg (ref 26.0–34.0)
MCHC: 31.2 g/dL (ref 30.0–36.0)
MCV: 102 fL — ABNORMAL HIGH (ref 80.0–100.0)
Monocytes Absolute: 0.6 10*3/uL (ref 0.1–1.0)
Monocytes Relative: 10 %
Neutro Abs: 3.3 10*3/uL (ref 1.7–7.7)
Neutrophils Relative %: 54 %
Platelets: 235 10*3/uL (ref 150–400)
RBC: 3.05 MIL/uL — ABNORMAL LOW (ref 3.87–5.11)
RDW: 13.8 % (ref 11.5–15.5)
WBC: 6 10*3/uL (ref 4.0–10.5)
nRBC: 0 % (ref 0.0–0.2)

## 2020-12-17 NOTE — ED Triage Notes (Signed)
Fell 4 days ago, right leg bruised

## 2020-12-17 NOTE — Telephone Encounter (Signed)
Call via voice message received from patient relaying that she had a fall Friday, 12/13/20, and that she bruised her leg; asked about seeing Dr Romeo Apple. Upon returning call to patient, there was no answer at phone number left on message [phone], or voice mail to leave a message.  Chart indicates that patient has gone to Emergency room to be evaluated - as we would have advised, to have a work up due to nature of injury (fall).

## 2020-12-17 NOTE — ED Provider Notes (Signed)
[facility] [address] EMERGENCY DEPARTMENT Provider Note  CSN: [id number] Arrival date & time: 12/17/20 1255    History Chief Complaint  Patient presents with   [name] is a 80 y.o. female with history of PAF on Eliquis reports she stumbled and fell 4 days ago landing on her knee. Initially just had an abrasion but in the interim has developed significant swelling and bruising to the knee and progressing down her leg. No CP, SOB. No other injuries.    Past Medical History:  Diagnosis Date   Anemia    Cardiomyopathy (HCC)    LVEF 40-45% 2011   CHF (congestive heart failure) (HCC)    Chronic insomnia    Coronary atherosclerosis of native coronary artery    60% LAD in 2002   Diverticulitis 2015   DJD (degenerative joint disease)    Left THA   Essential hypertension    GERD (gastroesophageal reflux disease)    Hyperlipidemia    Hypothyroidism    Left bundle branch block    Nephrolithiasis    Obesity    Paroxysmal atrial flutter [facility])    Documented January 2018    Past Surgical History:  Procedure Laterality Date   ABDOMINAL HYSTERECTOMY     (partial)   APPENDECTOMY     CHOLECYSTECTOMY     COLONOSCOPY  Aug 2013   Dr. Loreta Ave, [facility]: small internal hemorrhoids and few scattered sigmoid diverticula, otherwise normal up to TI, TI appeared normal   ESOPHAGOGASTRODUODENOSCOPY N/A 06/01/2012   RMR:5 cm hiatal hernia. Essentially normal esophagus   HEMORROIDECTOMY     JOINT REPLACEMENT     NOSE SURGERY     REVISION TOTAL HIP ARTHROPLASTY  2009   Left THA 2009    Family History  Problem Relation Age of Onset   Heart attack Father    Heart attack Mother    Diabetes Brother    Colon cancer Neg Hx     Social History   Tobacco Use   Smoking status: Never   Smokeless tobacco: Never  Vaping Use   Vaping Use: Never used  Substance Use Topics   Alcohol use: No    Alcohol/week: 0.0 standard drinks   Drug use: No     Home  Medications Prior to Admission medications   Medication Sig Start Date End Date Taking? Authorizing Provider  apixaban (ELIQUIS) 5 MG TABS tablet Take 1 tablet by mouth twice daily 11/29/20   Jonelle Sidle, MD  Calcium Carbonate-Vitamin D (CALCIUM 600 + D PO) Take 1 tablet by mouth daily.     [provider]  dapagliflozin propanediol (FARXIGA) 10 MG TABS tablet Take 1 tablet (10 mg total) by mouth daily. 11/07/20   Jonelle Sidle, MD  ENTRESTO 49-51 MG Take 1 tablet by mouth twice daily 12/06/20   Jonelle Sidle, MD  gabapentin (NEURONTIN) 100 MG capsule Take 100 mg by mouth at bedtime.    [provider]  isosorbide mononitrate (IMDUR) 30 MG 24 hr tablet Take 1 tablet by mouth every day 06/04/20   Jonelle Sidle, MD  metoprolol succinate (TOPROL-XL) 100 MG 24 hr tablet Take 1 tablet by mouth every day after a meal 06/04/20   Jonelle Sidle, MD  Multiple Vitamin (MULTIVITAMIN WITH MINERALS) TABS Take 1 tablet by mouth daily.    [provider]  nitroGLYCERIN (NITROSTAT) 0.4 MG SL tablet Place 1 tablet (0.4 mg total) under the tongue every 5 (five) minutes  as needed. For chest pain 08/06/16   Jonelle Sidle, MD  omeprazole (PRILOSEC) 20 MG capsule Take 20 mg by mouth every morning.    [provider]  pravastatin (PRAVACHOL) 40 MG tablet Take 40 mg by mouth at bedtime.    [provider]  spironolactone (ALDACTONE) 25 MG tablet Take one-half tablets by mouth every day 12/17/20   Jonelle Sidle, MD  tamsulosin (FLOMAX) 0.4 MG CAPS capsule Take 1 capsule (0.4 mg total) by mouth daily. 02/25/16   Beaulah Dinning, MD  torsemide (DEMADEX) 20 MG tablet Take 1-2 tablets (20-40 mg total) by mouth daily. Alternate 20 mg & 40 mg by mouth daily 11/07/20   Jonelle Sidle, MD     Allergies    Codeine and Tape   Review of Systems   Review of Systems A comprehensive review of systems was completed and negative except as noted in  HPI.    Physical Exam BP 119/67   Pulse 69   Temp (!) 97.4 F (36.3 C) (Oral)   Resp 16   Ht 5\' 3"  (1.6 m)   Wt 95.1 kg   SpO2 100%   BMI 37.14 kg/m   Physical Exam Vitals and nursing note reviewed.  Constitutional:      Appearance: Normal appearance.  HENT:     Head: Normocephalic and atraumatic.     Nose: Nose normal.     Mouth/Throat:     Mouth: Mucous membranes are moist.  Eyes:     Extraocular Movements: Extraocular movements intact.     Conjunctiva/sclera: Conjunctivae normal.  Cardiovascular:     Rate and Rhythm: Normal rate.  Pulmonary:     Effort: Pulmonary effort is normal.     Breath sounds: Normal breath sounds.  Abdominal:     General: Abdomen is flat.     Palpations: Abdomen is soft.     Tenderness: There is no abdominal tenderness.  Musculoskeletal:        General: Swelling and tenderness present.     Cervical back: Neck supple.     Comments: Extensive soft tissue swelling and bruising to R knee and down into R anterior lower leg, tenderness over the anterior R knee. Decreased ROM due to pain. Compartments soft. Distal pulse intact.   There is also some healing ecchymosis of L foot from more remote injury about 6 weeks ago, followed by Podiatry.   Skin:    General: Skin is warm and dry.     Findings: Bruising present.  Neurological:     General: No focal deficit present.     Mental Status: She is alert.  Psychiatric:        Mood and Affect: Mood normal.      ED Results / Procedures / Treatments   Labs (all labs ordered are listed, but only abnormal results are displayed) Labs Reviewed  CBC WITH DIFFERENTIAL/PLATELET - Abnormal; Notable for the following components:      Result Value   RBC 3.05 (*)    Hemoglobin 9.7 (*)    HCT 31.1 (*)    MCV 102.0 (*)    All other components within normal limits    EKG None  Radiology CT Knee Right Wo Contrast  Result Date: 12/17/2020 CLINICAL DATA:  Knee pain and swelling after fall 4 days ago  EXAM: CT OF THE RIGHT KNEE WITHOUT CONTRAST TECHNIQUE: Multidetector CT imaging of the right knee was performed according to the standard protocol. Multiplanar CT image reconstructions were  also generated. COMPARISON:  X-ray 12/17/2020 FINDINGS: Bones/Joint/Cartilage No evidence of acute fracture of the right knee. No malalignment. Tricompartmental osteoarthritis as manifested by joint space narrowing, subchondral sclerosis, and subchondral cystic change are most pronounced within the lateral and patellofemoral compartments. Small knee joint effusion. No evidence of a hemarthrosis or lipohemarthrosis. Ligaments Suboptimally assessed by CT. Muscles and Tendons No acute musculotendinous abnormality by CT. Extensor mechanism appears intact. Soft tissues Large hematoma within the prepatellar soft tissues measuring approximately 14 x 6.5 x 7 cm. Mild diffuse subcutaneous edema. IMPRESSION: 1. No evidence of acute fracture or malalignment of the right knee. 2. Large hematoma within the prepatellar soft tissues measuring approximately 14 x 6.5 x 7 cm. 3. Tricompartmental osteoarthritis, most pronounced within the lateral and patellofemoral compartments. 4. Small knee joint effusion. Electronically Signed   By: Duanne Guess D.O.   On: 12/17/2020 17:13   DG Knee Complete 4 Views Right  Result Date: 12/17/2020 CLINICAL DATA:  Fall, pain, bruising EXAM: RIGHT KNEE - COMPLETE 4+ VIEW COMPARISON:  None. FINDINGS: There is a possible defect of the cortex of the tibial plateau seen on the oblique projection. No other fracture line is identified. There is moderate lateral and mild medial tibiofemoral joint space narrowing with associated subchondral sclerosis and osteophytosis. There is diffuse soft tissue swelling about the knee with a suspected small effusion. There is no lipohemarthrosis. IMPRESSION: 1. Possible cortical defect along the tibial plateau as above may be artifactual; however, CT of the knee is suggested to  exclude tibial plateau fracture. 2. Diffuse soft tissue swelling with a suspected small effusion. 3. Degenerative changes as above. Electronically Signed   By: Lesia Hausen M.D.   On: 12/17/2020 16:22    Procedures Procedures  Medications Ordered in the ED Medications - No data to display   MDM Rules/Calculators/A&P MDM Mechanical fall with R knee injury, progressive bruising and swelling, on Eliquis. May just be soft tissue injury, will check xray for signs of fracture and CBC to assess signs of acute blood loss.   ED Course  I have reviewed the triage vital signs and the nursing notes.  Pertinent labs & imaging results that were available during my care of the patient were reviewed by me and considered in my medical decision making (see chart for details).  Clinical Course as of 12/17/20 1745  Tue Dec 17, 2020  1616 CBC with lower Hgb, but not in need of emergent transfusion.  [CS]  1627 Xray concerning for tibial plateau fracture, will send for CT. [CS]  1744 CT negative for fracture, demonstrated large hematoma. Will place in ACE wrap for compression, recommend close PCP follow up for re-evaluation. RTED for any other concerns.  [CS]    Clinical Course User Index [CS] Pollyann Savoy, MD    Final Clinical Impression(s) / ED Diagnoses Final diagnoses:  Hematoma of right knee region    Rx / DC Orders ED Discharge Orders     None        Pollyann Savoy, MD 12/17/20 1745

## 2020-12-17 NOTE — ED Notes (Signed)
Pt provided discharge instructions and prescription information. Pt was given the opportunity to ask questions and questions were answered. Discharge signature not obtained in the setting of the COVID-19 pandemic in order to reduce high touch surfaces.  ° °

## 2020-12-23 ENCOUNTER — Telehealth: Payer: Self-pay

## 2020-12-23 DIAGNOSIS — S8011XA Contusion of right lower leg, initial encounter: Secondary | ICD-10-CM | POA: Diagnosis not present

## 2020-12-23 DIAGNOSIS — Z7901 Long term (current) use of anticoagulants: Secondary | ICD-10-CM | POA: Diagnosis not present

## 2020-12-23 DIAGNOSIS — I484 Atypical atrial flutter: Secondary | ICD-10-CM | POA: Diagnosis not present

## 2020-12-23 DIAGNOSIS — I951 Orthostatic hypotension: Secondary | ICD-10-CM | POA: Diagnosis not present

## 2020-12-23 DIAGNOSIS — S8001XA Contusion of right knee, initial encounter: Secondary | ICD-10-CM | POA: Diagnosis not present

## 2020-12-23 DIAGNOSIS — W1830XA Fall on same level, unspecified, initial encounter: Secondary | ICD-10-CM | POA: Diagnosis not present

## 2020-12-23 NOTE — Telephone Encounter (Signed)
Jonelle Sidle, MD    Actually, based on that reported blood pressure and heart rate I would probably hold her Sherryll Burger first and then have her come back to see [place] for a nurse visit with repeat vital signs over the next week.  We might be able to resume Entresto at the 24/26 mg twice daily dose        Spoke with nurse at Dr. Jorge Ny office and relayed medication management advice from Dr. Diona Browner. She verbalized understanding and will report to Dr. [name]. Pt to be set up with Nursing appt for follow up vitals per Dr. Diona Browner.

## 2020-12-23 NOTE — Telephone Encounter (Signed)
Dr. Jorge Ny office called and stated that the pt's bp is 86/54 hr 83. Dr. [name] is concerned with her diastolic numbers. He would like to cut her Metoprolol in half, but would like confirmation from you that this is okay first.  Please advise.

## 2020-12-25 DIAGNOSIS — H04123 Dry eye syndrome of bilateral lacrimal glands: Secondary | ICD-10-CM | POA: Diagnosis not present

## 2020-12-25 DIAGNOSIS — H3554 Dystrophies primarily involving the retinal pigment epithelium: Secondary | ICD-10-CM | POA: Diagnosis not present

## 2020-12-25 DIAGNOSIS — Z961 Presence of intraocular lens: Secondary | ICD-10-CM | POA: Diagnosis not present

## 2020-12-31 ENCOUNTER — Other Ambulatory Visit (HOSPITAL_COMMUNITY): Payer: Self-pay | Admitting: Family Medicine

## 2020-12-31 DIAGNOSIS — Z1231 Encounter for screening mammogram for malignant neoplasm of breast: Secondary | ICD-10-CM

## 2021-01-01 NOTE — Progress Notes (Addendum)
Cardiology Office Note  Date: 01/02/2021   ID: Cassiopeia, Florentino 12-19-1940, MRN [id number]  PCP:  [name], MD  Cardiologist:  [name], MD Electrophysiologist:  None   Chief Complaint: Follow-up medication management. Low BP and metoprolol cut in half. Entresto stopped  History of Present Illness: MARISSA WEAVER is a 80 y.o. female with a history of chronic combined systolic diastolic heart failure, CKD stage III, permanent atrial fibrillation, CAD.  She was last seen by Dr. Diona Browner on 11/07/2020 with over all relatively stable NYHA class II-III dyspnea.  Continues to have fluctuating leg edema and lymphedema.  He recommended decreasing her Demadex dose in half.  She was started on Farxiga at last visit.  Her torsemide dose was changed to 20 mg q. OD and 40 mg q. OD.  Follow-up lab work was ordered for before next follow-up visit in 6 months.  She recently presented to [facility] on 12/17/2020 states she had swelling stumbled and fell 4 days prior.  Initially had an abrasion but in the interim had developed significant swelling and bruising today and progressing down her leg.    Subsequent phone call from PCP office stating patient's blood pressure was 86/54.  Dr. [name] was requesting patient cut her metoprolol in half but wanted confirmation from cardiology.  Dr. Diona Browner suggested she hold her Sherryll Burger and schedule for nurse visit for vital signs over the next week and may be able to resume Entresto.  She is here for follow-up today.  Her right leg hematoma is healing she states its still swollen and bruised with ecchymosis but appears to be getting better.  She has continued her Eliquis without any new signs of bleeding.  Her blood pressure is much improved since decreased blood pressure at primary care provider's office.  She has gained some weight.  She states her appetite has been not that good and she has been drinking more fluids to compensate.  We discussed sodium,  fluid restrictions and weighing every day for her heart failure.  She continues to take her torsemide as directed.  Blood pressure is much improved today at 126/80.  She denies any SOB or DOE, orthostatic symptoms, palpitations or arrhythmias, CVA or TIA-like symptoms, PND, orthopnea, bleeding.  No claudication-like symptoms, DVT or PE-like symptoms.  Continues with RLE edema in but improving  Past Medical History:  Diagnosis Date   Anemia    Cardiomyopathy (HCC)    LVEF 40-45% 2011   CHF (congestive heart failure) (HCC)    Chronic insomnia    Coronary atherosclerosis of native coronary artery    60% LAD in 2002   Diverticulitis 2015   DJD (degenerative joint disease)    Left THA   Essential hypertension    GERD (gastroesophageal reflux disease)    Hyperlipidemia    Hypothyroidism    Left bundle branch block    Nephrolithiasis    Obesity    Paroxysmal atrial flutter (HCC)    Documented January 2018    Past Surgical History:  Procedure Laterality Date   ABDOMINAL HYSTERECTOMY     (partial)   APPENDECTOMY     CHOLECYSTECTOMY     COLONOSCOPY  Aug 2013   Dr. Loreta Ave, [facility]: small internal hemorrhoids and few scattered sigmoid diverticula, otherwise normal up to TI, TI appeared normal   ESOPHAGOGASTRODUODENOSCOPY N/A 06/01/2012   RMR:5 cm hiatal hernia. Essentially normal esophagus   HEMORROIDECTOMY     JOINT REPLACEMENT     NOSE  SURGERY     REVISION TOTAL HIP ARTHROPLASTY  2009   Left THA 2009    Current Outpatient Medications  Medication Sig Dispense Refill   apixaban (ELIQUIS) 5 MG TABS tablet Take 1 tablet by mouth twice daily 60 tablet 5   Calcium Carbonate-Vitamin D (CALCIUM 600 + D PO) Take 1 tablet by mouth daily.      dapagliflozin propanediol (FARXIGA) 10 MG TABS tablet Take 1 tablet (10 mg total) by mouth daily. 30 tablet 6   gabapentin (NEURONTIN) 100 MG capsule Take 100 mg by mouth at bedtime.     isosorbide mononitrate (IMDUR) 30 MG 24 hr  tablet Take 1 tablet by mouth every day 30 tablet 11   metoprolol succinate (TOPROL-XL) 100 MG 24 hr tablet Take 1 tablet by mouth every day after a meal 30 tablet 11   Multiple Vitamin (MULTIVITAMIN WITH MINERALS) TABS Take 1 tablet by mouth daily.     nitroGLYCERIN (NITROSTAT) 0.4 MG SL tablet Place 1 tablet (0.4 mg total) under the tongue every 5 (five) minutes as needed. For chest pain 25 tablet 3   omeprazole (PRILOSEC) 20 MG capsule Take 20 mg by mouth every morning.     pravastatin (PRAVACHOL) 40 MG tablet Take 40 mg by mouth at bedtime.     sacubitril-valsartan (ENTRESTO) 24-26 MG Take 1 tablet by mouth 2 (two) times daily. 60 tablet 6   spironolactone (ALDACTONE) 25 MG tablet Take one-half tablets by mouth every day 15 tablet 11   tamsulosin (FLOMAX) 0.4 MG CAPS capsule Take 1 capsule (0.4 mg total) by mouth daily. 30 capsule 0   torsemide (DEMADEX) 20 MG tablet Take 1-2 tablets (20-40 mg total) by mouth daily. Alternate 20 mg & 40 mg by mouth daily 135 tablet 1   No current facility-administered medications for this visit.   Allergies:  Codeine and Tape   Social History: The patient  reports that she has never smoked. She has never used smokeless tobacco. She reports that she does not drink alcohol and does not use drugs.   Family History: The patient's family history includes Diabetes in her brother; Heart attack in her father and mother.   ROS:  Please see the history of present illness. Otherwise, complete review of systems is positive for none.  All other systems are reviewed and negative.   Physical Exam: VS:  BP 126/80   Pulse 80   Ht 5' 3.5" (1.613 m)   Wt 211 lb 6.4 oz (95.9 kg)   SpO2 98%   BMI 36.86 kg/m , BMI Body mass index is 36.86 kg/m.  Wt Readings from Last 3 Encounters:  01/02/21 211 lb 6.4 oz (95.9 kg)  12/17/20 209 lb 10.5 oz (95.1 kg)  11/07/20 204 lb 6.4 oz (92.7 kg)    General: Obese patient appears comfortable at rest. Neck: Supple, no elevated  JVP or carotid bruits, no thyromegaly. Lungs: Clear to auscultation, nonlabored breathing at rest. Cardiac: Regular rate and rhythm, no S3 or significant systolic murmur, no pericardial rub. Extremities: No pitting edema, distal pulses 2+.  RLE continues to be swollen with ecchymotic areas/bruising/swelling but proved Skin: Warm and dry. Musculoskeletal: No kyphosis. Neuropsychiatric: Alert and oriented x3, affect grossly appropriate.  ECG:    Recent Labwork: 11/04/2020: BUN 33; Creatinine, Ser 1.57; Potassium 5.4; Sodium 137 12/17/2020: Hemoglobin 9.7; Platelets 235     Component Value Date/Time   CHOL 155 06/17/2009 2016   TRIG 97 06/17/2009 2016   HDL 62 06/17/2009 2016  CHOLHDL 2.5 Ratio 06/17/2009 2016   VLDL 19 06/17/2009 2016   LDLCALC 74 06/17/2009 2016    Other Studies Reviewed Today:    Echocardiogram 07/24/2020:  1. Left ventricular ejection fraction, by estimation, is 30 to 35%. The  left ventricle has moderately decreased function. The left ventricle  demonstrates global hypokinesis. Left ventricular diastolic parameters are  consistent with Grade III diastolic  dysfunction (restrictive). Elevated left atrial pressure. The average left  ventricular global longitudinal strain is 13.8 %. The global longitudinal  strain is abnormal.   2. Right ventricular systolic function is low normal. The right  ventricular size is mildly enlarged.   3. Left atrial size was severely dilated.   4. Right atrial size was severely dilated.   5. The pericardial effusion is circumferential.   6. The mitral valve is abnormal. Moderate mitral valve regurgitation. No  evidence of mitral stenosis.   7. The tricuspid valve is abnormal. Tricuspid valve regurgitation is mild  to moderate.   8. The aortic valve is tricuspid. There is mild calcification of the  aortic valve. There is mild thickening of the aortic valve. Aortic valve  regurgitation is not visualized. No aortic stenosis is  present.   9. The inferior vena cava is normal in size with greater than 50%  respiratory variability, suggesting right atrial pressure of 3 mmHg. Assessment and Plan:  1. Chronic combined systolic (congestive) and diastolic (congestive) heart failure (HCC)   2. Nonischemic cardiomyopathy (HCC)   3. Stage 3 chronic kidney disease, unspecified whether stage 3a or 3b CKD (HCC)   4. Permanent atrial fibrillation (HCC)   5. CAD in native artery    1. Chronic combined systolic (congestive) and diastolic (congestive) heart failure (HCC) Entresto had previous been stopped for low blood pressures.  Blood pressure is much better today at 126/80.  She has gained some weight since July when she weighed 204.  Today she weighs 211.  Please restart Entresto at 24/26 mg p.o. twice daily.  May be able to titrate depending on blood pressures and pending labs.  If low BPs on Entresto may possibly switch to ARB such as Cozaar.  Please get a follow-up basic metabolic panel and magnesium in 2 weeks after restarting.  Continue spironolactone 12.5 mg p.o. daily.  Continue torsemide 1 to 2 tablets by mouth daily alternate between 20 mg and 40 mg daily.  Continue Farxiga 10 mg daily.  Continue Toprol-XL 100 mg daily  2. Nonischemic cardiomyopathy (HCC) Last echocardiogram 07/24/2020 3035% EF, global hypokinesis, grade 3 diastolic dysfunction, LA severely dilated, RA severely dilated, moderate MR, mild to moderate TR.    3. Stage 3 chronic kidney disease, unspecified whether stage 3a or 3b CKD (HCC) Last renal function on 11/04/2020 creatinine 1.57 with GFR 34.  We have restarted Entresto at a lower dose after stopping for low blood pressures.  We will get a basic metabolic panel and magnesium in 2 weeks after restarting.  4. Permanent atrial fibrillation (HCC) Continue Toprol-XL 100 mg daily.  Continue Eliquis 5 mg p.o. twice daily.  Heart rate controlled at 80 today.  5. CAD in native artery Denies any anginal or  exertional symptoms.  Continue Imdur 30 mg daily, sublingual nitroglycerin as needed.  Not on aspirin due to systemic anticoagulation.  Continue pravastatin 40 mg p.o. daily  Medication Adjustments/Labs and Tests Ordered: Current medicines are reviewed at length with the patient today.  Concerns regarding medicines are outlined above.   Disposition: Follow-up with Dr.  McDowell or APP 6 months.  Signed, Rennis Harding, NP 01/02/2021 8:26 AM    [facility] [address] [place], [place], [place] [address] Phone: [phone]; Fax: [phone]

## 2021-01-02 ENCOUNTER — Ambulatory Visit (INDEPENDENT_AMBULATORY_CARE_PROVIDER_SITE_OTHER): Payer: Medicare Other | Admitting: Family Medicine

## 2021-01-02 ENCOUNTER — Encounter: Payer: Self-pay | Admitting: Family Medicine

## 2021-01-02 VITALS — BP 126/80 | HR 80 | Ht 63.5 in | Wt 211.4 lb

## 2021-01-02 DIAGNOSIS — I428 Other cardiomyopathies: Secondary | ICD-10-CM | POA: Diagnosis not present

## 2021-01-02 DIAGNOSIS — I4821 Permanent atrial fibrillation: Secondary | ICD-10-CM | POA: Diagnosis not present

## 2021-01-02 DIAGNOSIS — I5042 Chronic combined systolic (congestive) and diastolic (congestive) heart failure: Secondary | ICD-10-CM

## 2021-01-02 DIAGNOSIS — I2510 Atherosclerotic heart disease of native coronary artery without angina pectoris: Secondary | ICD-10-CM | POA: Diagnosis not present

## 2021-01-02 DIAGNOSIS — N183 Chronic kidney disease, stage 3 unspecified: Secondary | ICD-10-CM | POA: Diagnosis not present

## 2021-01-02 MED ORDER — SACUBITRIL-VALSARTAN 24-26 MG PO TABS: 1.0000 | ORAL_TABLET | Freq: Two times a day (BID) | ORAL | 6 refills | Status: DC

## 2021-01-02 NOTE — Patient Instructions (Addendum)
Medication Instructions:  Restart Entresto at lower dose of 24/26mg  twice a day.   Continue all other medications.     Labwork: BMET, Mg - orders given today.  Please do in 2 weeks (around 01/16/2021). Office will contact with results via phone or letter.     Testing/Procedures: none  Follow-Up: 6 months   Any Other Special Instructions Will Be Listed Below (If Applicable).   If you need a refill on your cardiac medications before your next appointment, please call your pharmacy.

## 2021-01-10 DIAGNOSIS — I484 Atypical atrial flutter: Secondary | ICD-10-CM | POA: Diagnosis not present

## 2021-01-10 DIAGNOSIS — I10 Essential (primary) hypertension: Secondary | ICD-10-CM | POA: Diagnosis not present

## 2021-01-10 DIAGNOSIS — Z7901 Long term (current) use of anticoagulants: Secondary | ICD-10-CM | POA: Diagnosis not present

## 2021-01-10 DIAGNOSIS — I2510 Atherosclerotic heart disease of native coronary artery without angina pectoris: Secondary | ICD-10-CM | POA: Diagnosis not present

## 2021-01-14 DIAGNOSIS — I951 Orthostatic hypotension: Secondary | ICD-10-CM | POA: Diagnosis not present

## 2021-01-14 DIAGNOSIS — I484 Atypical atrial flutter: Secondary | ICD-10-CM | POA: Diagnosis not present

## 2021-01-14 DIAGNOSIS — Z7901 Long term (current) use of anticoagulants: Secondary | ICD-10-CM | POA: Diagnosis not present

## 2021-01-14 DIAGNOSIS — S8011XD Contusion of right lower leg, subsequent encounter: Secondary | ICD-10-CM | POA: Diagnosis not present

## 2021-01-14 DIAGNOSIS — S8001XD Contusion of right knee, subsequent encounter: Secondary | ICD-10-CM | POA: Diagnosis not present

## 2021-02-06 ENCOUNTER — Other Ambulatory Visit: Payer: Self-pay

## 2021-02-06 ENCOUNTER — Ambulatory Visit (HOSPITAL_COMMUNITY)
Admission: RE | Admit: 2021-02-06 | Discharge: 2021-02-06 | Disposition: A | Discharge status: Home / Self Care | Payer: Medicare Other | Source: Ambulatory Visit | Attending: Family Medicine | Admitting: Family Medicine

## 2021-02-06 DIAGNOSIS — Z1231 Encounter for screening mammogram for malignant neoplasm of breast: Secondary | ICD-10-CM | POA: Diagnosis not present

## 2021-02-10 ENCOUNTER — Other Ambulatory Visit: Payer: Self-pay | Admitting: Cardiology

## 2021-02-11 DIAGNOSIS — S8011XD Contusion of right lower leg, subsequent encounter: Secondary | ICD-10-CM | POA: Diagnosis not present

## 2021-02-11 DIAGNOSIS — I484 Atypical atrial flutter: Secondary | ICD-10-CM | POA: Diagnosis not present

## 2021-02-11 DIAGNOSIS — S8001XD Contusion of right knee, subsequent encounter: Secondary | ICD-10-CM | POA: Diagnosis not present

## 2021-02-11 DIAGNOSIS — Z7901 Long term (current) use of anticoagulants: Secondary | ICD-10-CM | POA: Diagnosis not present

## 2021-03-12 ENCOUNTER — Other Ambulatory Visit: Payer: Self-pay | Admitting: Cardiology

## 2021-04-05 ENCOUNTER — Other Ambulatory Visit: Payer: Self-pay | Admitting: Cardiology

## 2021-04-17 NOTE — Progress Notes (Signed)
Primary Care Physician:  [name], MD Primary Gastroenterologist:  Dr. Jena Gauss  Chief Complaint  Patient presents with   Follow-up    HPI:   [name] is a 81 y.o. female with history of GERD, chronic epigastric/upper abdominal pain who states she is presenting today for a follow-up per her PCP.   She hasn't been seen since 2014. At that time she was following up for abdominal pain. Noted extensive evaluation previously for abdominal pain including labs, CTA in 2014 with widely patent celiac artery, atherosclerotic plaque at origin of SMA without significant stenosis, IMA widely patent, EGD in 2014 unrevealing, prior colonoscopy in 2013 with no significant findings.  Noted positive Carnett's sign on exam at the time of her office visit and overall significant improvement in her pain over time.  She had no concerning factors was on Prilosec 40 mg twice daily.  No changes were made.  Today she states she is doing very well.  Cannot remember the last time that she has had any problems with abdominal pain.  Over time, her pain just seem to resolve.  She denies reflux symptoms on omeprazole 20 mg daily. Denies dysphagia, nausea, vomiting, unintentional weight loss, BRBPR, melena.  Bowels are moving well.  No family history of colon cancer or colon polyps.  She would like to schedule I more colonoscopy.  Denies chest pain,  heart palpitations, shortness of breath.  Reports she goes out dancing 3-4 nights a week. Doesn't require a "fluid medication" daily. Takes as needed, but can't remember what she is taking. ?  Furosemide or torsemide   Rare use of Advil for a HA.   Past Medical History:  Diagnosis Date   Anemia    Cardiomyopathy (HCC)    CHF (congestive heart failure) (HCC)    EF 30-35% in 2022   Chronic insomnia    Coronary atherosclerosis of native coronary artery    60% LAD in 2002   Diverticulitis 2015   DJD (degenerative joint disease)    Left THA   Essential  hypertension    GERD (gastroesophageal reflux disease)    Hyperlipidemia    Hypothyroidism    Left bundle branch block    Nephrolithiasis    Obesity    Paroxysmal atrial flutter [facility])    Documented January 2018    Past Surgical History:  Procedure Laterality Date   ABDOMINAL HYSTERECTOMY     (partial)   APPENDECTOMY     CHOLECYSTECTOMY     COLONOSCOPY  Aug 2013   Dr. Loreta Ave, [facility]: small internal hemorrhoids and few scattered sigmoid diverticula, otherwise normal up to TI, TI appeared normal   ESOPHAGOGASTRODUODENOSCOPY N/A 06/01/2012   RMR:5 cm hiatal hernia. Essentially normal esophagus   HEMORROIDECTOMY     JOINT REPLACEMENT     NOSE SURGERY     REVISION TOTAL HIP ARTHROPLASTY  2009   Left THA 2009    Current Outpatient Medications  Medication Sig Dispense Refill   apixaban (ELIQUIS) 5 MG TABS tablet Take 1 tablet by mouth twice daily 60 tablet 5   Calcium Carbonate-Vitamin D (CALCIUM 600 + D PO) Take 1 tablet by mouth daily.      gabapentin (NEURONTIN) 100 MG capsule Take 100 mg by mouth at bedtime.     isosorbide mononitrate (IMDUR) 30 MG 24 hr tablet Take 1 tablet by mouth every day 30 tablet 11   lisinopril (ZESTRIL) 20 MG tablet Take 20 mg by mouth in the morning and  at bedtime.     metoprolol succinate (TOPROL-XL) 100 MG 24 hr tablet Take 1 tablet by mouth every day after a meal 30 tablet 11   Multiple Vitamin (MULTIVITAMIN WITH MINERALS) TABS Take 1 tablet by mouth daily.     nitroGLYCERIN (NITROSTAT) 0.4 MG SL tablet Place 1 tablet (0.4 mg total) under the tongue every 5 (five) minutes as needed. For chest pain 25 tablet 3   omeprazole (PRILOSEC) 20 MG capsule Take 20 mg by mouth every morning.     pravastatin (PRAVACHOL) 40 MG tablet Take 40 mg by mouth at bedtime.     tamsulosin (FLOMAX) 0.4 MG CAPS capsule Take 1 capsule (0.4 mg total) by mouth daily. 30 capsule 0   No current facility-administered medications for this visit.    Allergies as  of 04/18/2021 - Review Complete 04/18/2021  Allergen Reaction Noted   Codeine Itching 02/19/2009   Tape Itching 06/03/2015    Family History  Problem Relation Age of Onset   Heart attack Mother    Heart attack Father    Diabetes Brother    Colon cancer Neg Hx    Colon polyps Neg Hx     Social History   Socioeconomic History   Marital status: Widowed    Spouse name: Not on file   Number of children: Not on file   Years of education: Not on file   Highest education level: Not on file  Occupational History   Occupation: retired    Comment: used to work at [employer] in [employer]    Employer: RETIRED  Tobacco Use   Smoking status: Never   Smokeless tobacco: Never  Vaping Use   Vaping Use: Never used  Substance and Sexual Activity   Alcohol use: No    Alcohol/week: 0.0 standard drinks   Drug use: No   Sexual activity: Not on file  Other Topics Concern   Not on file  Social History Narrative   Not on file   Social Determinants of Health   Financial Resource Strain: Not on file  Food Insecurity: Not on file  Transportation Needs: Not on file  Physical Activity: Not on file  Stress: Not on file  Social Connections: Not on file  Intimate Partner Violence: Not on file    Review of Systems: Gen: Denies any fever, chills, cold or flulike symptoms, presyncope, syncope.  Gets a little tired/fatigued if she does too much. CV: Denies chest pain, heart palpitations. Resp: Denies shortness of breath or cough. GI: See HPI GU : Denies urinary burning, urinary frequency, urinary hesitancy MS: Denies joint pain. Derm: Denies rash. Psych: Denies depression, anxiety. Heme: See HPI  Physical Exam: BP 116/67    Pulse 68    Temp (!) 97.1 F (36.2 C)    Ht 5\' 4"  (1.626 m)    Wt 208 lb (94.3 kg)    BMI 35.70 kg/m  General:   Alert and oriented. Pleasant and cooperative. Well-nourished and well-developed.  Head:  Normocephalic and atraumatic. Eyes:  Without icterus, sclera clear  and conjunctiva pink.  Ears:  Normal auditory acuity. Lungs:  Clear to auscultation bilaterally. No wheezes, rales, or rhonchi. No distress.  Heart:  S1, S2 present without murmurs appreciated.  Abdomen:  +BS, soft, non-tender and non-distended. No HSM noted. No guarding or rebound. No masses appreciated.  Rectal:  Deferred  Msk:  Symmetrical without gross deformities. Normal posture. Extremities:  Without edema. Neurologic:  Alert and  oriented x4;  grossly normal neurologically.  Skin:  Intact without significant lesions or rashes. Psych:  Normal mood and affect.    Assessment: 81 year old female with history of atrial fibrillation/flutter on Eliquis, CHF with EF 30-35%, CAD without intervention, HTN, HLD, GERD, who states she is presenting today at the request of her PCP for "follow-up".    We had previously been following her for GERD and chronic upper abdominal pain, last seen in 2014 and noting extensive evaluation that had been unrevealing (see HPI) and overall significant improvement with time.  Today, patient states she cannot remember the last time she has had any problems with abdominal pain.  GERD symptoms are well controlled on omeprazole 20 mg daily without alarm symptoms and currently managed by PCP.  Notably, her last colonoscopy was in 2013 revealing small internal hemorrhoids and few scattered sigmoid diverticula, otherwise normal exam.  She is due for routine screening.  We discussed that general screening typically stops at age 34, but we could consider 1 more colonoscopy as her last colonoscopy was 10 years ago.  We discussed the risks of the procedure in conjunction with her medical history.  She prefers to go ahead and proceed with 1 more colonoscopy.  She has no family history of colon cancer or colon polyps and no significant lower GI symptoms or alarm symptoms at this time.  We will reach out to her cardiologist to obtain clearance and if the okay to hold Eliquis prior to  scheduling.   Plan:  Plan for colonoscopy with propofol with Dr. Jena Gauss in the near future. The risks, benefits, and alternatives have been discussed with the patient in detail. The patient states understanding and desires to proceed. ASA III+ We will reach out to cardiology to obtain cardiac clearance and get the okay to hold Eliquis for 48 hours prior to procedure. Okay to continue omeprazole 20 mg daily, currently managed by PCP. Follow-up as needed.   Ermalinda Memos, PA-C [facility] 04/18/2021

## 2021-04-18 ENCOUNTER — Ambulatory Visit (INDEPENDENT_AMBULATORY_CARE_PROVIDER_SITE_OTHER): Payer: Commercial Managed Care - HMO | Admitting: Gastroenterology

## 2021-04-18 ENCOUNTER — Telehealth: Payer: Self-pay

## 2021-04-18 ENCOUNTER — Encounter: Payer: Self-pay | Admitting: Gastroenterology

## 2021-04-18 ENCOUNTER — Other Ambulatory Visit: Payer: Self-pay

## 2021-04-18 VITALS — BP 116/67 | HR 68 | Temp 97.1°F | Ht 64.0 in | Wt 208.0 lb

## 2021-04-18 DIAGNOSIS — Z1211 Encounter for screening for malignant neoplasm of colon: Secondary | ICD-10-CM | POA: Diagnosis not present

## 2021-04-18 DIAGNOSIS — R1010 Upper abdominal pain, unspecified: Secondary | ICD-10-CM | POA: Diagnosis not present

## 2021-04-18 DIAGNOSIS — K219 Gastro-esophageal reflux disease without esophagitis: Secondary | ICD-10-CM

## 2021-04-18 NOTE — Telephone Encounter (Signed)
Attention: Preop   We would like to request cardiac clearance and holding the following medication for patient please.  Procedure: Colonoscopy  Date: TBD  Medication to hold: Eliquis  Surgeon: Dr. Jena Gauss  Phone: [phone]  Fax:  [phone]  Type of Anesthesia: Propofol

## 2021-04-18 NOTE — Telephone Encounter (Signed)
° °  Primary Cardiologist: [name], MD  Clinical pharmacist have reviewed past medical history and medications for [name].  The following recommendations have been made:  Patient with diagnosis of afib on Eliquis for anticoagulation.     Procedure: colonoscopy Date of procedure: TBD   CHA2DS2-VASc Score = 6  This indicates a 9.7% annual risk of stroke. The patient's score is based upon: CHF History: 1 HTN History: 1 Diabetes History: 0 Stroke History: 0 Vascular Disease History: 1 Age Score: 2 Gender Score: 1   CrCl 35mL/min using adjusted body weight Platelet count 235K   Per office protocol, patient can hold Eliquis for 2 days prior to procedure.  I will route this recommendation to the requesting party via Epic fax function and remove from pre-op pool.  Please call with questions.  [name]. Jillian Eshleman NP-C    04/18/2021, 2:13 PM [facility] [address] Office [phone] Fax [phone]

## 2021-04-18 NOTE — Patient Instructions (Addendum)
We will plan to arrange for you to have a colonoscopy in the near future with Dr. Gala Romney. We are going to reach out to your cardiologist to get clearance and approval to hold Eliquis for 48 hours prior to your procedure.  It was a pleasure to meet you!   I am glad you are doing well!  Keep up the dancing!  Happy new year!    Aliene Altes, PA-C [facility]

## 2021-04-18 NOTE — Telephone Encounter (Signed)
Patient with diagnosis of afib on Eliquis for anticoagulation.    Procedure: colonoscopy Date of procedure: TBD  CHA2DS2-VASc Score = 6  This indicates a 9.7% annual risk of stroke. The patient's score is based upon: CHF History: 1 HTN History: 1 Diabetes History: 0 Stroke History: 0 Vascular Disease History: 1 Age Score: 2 Gender Score: 1  CrCl 37mL/min using adjusted body weight Platelet count 235K  Per office protocol, patient can hold Eliquis for 2 days prior to procedure.

## 2021-04-19 ENCOUNTER — Encounter: Payer: Self-pay | Admitting: Gastroenterology

## 2021-04-19 DIAGNOSIS — Z1211 Encounter for screening for malignant neoplasm of colon: Secondary | ICD-10-CM | POA: Insufficient documentation

## 2021-04-21 NOTE — Telephone Encounter (Signed)
This is covering cardiac clearance and holding Eliquis.

## 2021-04-21 NOTE — Telephone Encounter (Signed)
Cardiac clearance was requested for this patient as well. I only see the request for holding Eliquis addressed.

## 2021-04-21 NOTE — Telephone Encounter (Signed)
Noted. I also requested cardiac clearance as well. We need a note from an APP or MD regarding medical clearance for colonoscopy.

## 2021-04-21 NOTE — Telephone Encounter (Signed)
Will call patient once we receive his future schedule

## 2021-04-21 NOTE — Telephone Encounter (Signed)
Reviewed.  Cardiac clearance obtained along with okay to hold Eliquis for 48 hours prior to procedure.  [facility] clinical pool, please proceed with scheduling colonoscopy as previously recommended. (Orders provided at time of last OV).

## 2021-04-21 NOTE — Telephone Encounter (Signed)
Request to hold Eliquis.

## 2021-04-21 NOTE — Telephone Encounter (Signed)
° °  Primary Cardiologist: [name], MD  Chart reviewed as part of pre-operative protocol coverage. Given past medical history and time since last visit, based on ACC/AHA guidelines, [name] would be at acceptable risk for the planned procedure without further cardiovascular testing.   Patient with diagnosis of afib on Eliquis for anticoagulation.     Procedure: colonoscopy Date of procedure: TBD   CHA2DS2-VASc Score = 6  This indicates a 9.7% annual risk of stroke. The patient's score is based upon: CHF History: 1 HTN History: 1 Diabetes History: 0 Stroke History: 0 Vascular Disease History: 1 Age Score: 2 Gender Score: 1   CrCl 7mL/min using adjusted body weight Platelet count 235K   Per office protocol, patient can hold Eliquis for 2 days prior to procedure.  I will route this recommendation to the requesting party via Epic fax function and remove from pre-op pool.  Please call with questions.  Jillian Ripple. Malcom Selmer NP-C    04/21/2021, 10:30 AM [facility] [address] Office [phone] Fax [phone]

## 2021-04-24 NOTE — Telephone Encounter (Signed)
Called pt to schedule TCS w/Propofol ASA 3/4 w/Dr. Jena Gauss. Offered 05/08/21 and 05/09/21 but pt has other appts. Advised we will call back to schedule TCS when his next schedule is available.

## 2021-04-28 DIAGNOSIS — I484 Atypical atrial flutter: Secondary | ICD-10-CM | POA: Diagnosis not present

## 2021-04-28 DIAGNOSIS — I10 Essential (primary) hypertension: Secondary | ICD-10-CM | POA: Diagnosis not present

## 2021-04-28 DIAGNOSIS — Z7901 Long term (current) use of anticoagulants: Secondary | ICD-10-CM | POA: Diagnosis not present

## 2021-04-28 DIAGNOSIS — I2510 Atherosclerotic heart disease of native coronary artery without angina pectoris: Secondary | ICD-10-CM | POA: Diagnosis not present

## 2021-04-30 NOTE — Telephone Encounter (Signed)
Called pt, no answer and no VM 

## 2021-05-01 NOTE — Telephone Encounter (Signed)
Called pt, she doesn't want to schedule TCS at this time. Advised her to call office when she is ready to proceed w/TCS. Ok was received from cardiology and to hold Eliquis.

## 2021-05-01 NOTE — Telephone Encounter (Signed)
Noted  

## 2021-05-06 DIAGNOSIS — I428 Other cardiomyopathies: Secondary | ICD-10-CM | POA: Diagnosis not present

## 2021-05-06 DIAGNOSIS — I480 Paroxysmal atrial fibrillation: Secondary | ICD-10-CM | POA: Diagnosis not present

## 2021-05-06 DIAGNOSIS — Z0001 Encounter for general adult medical examination with abnormal findings: Secondary | ICD-10-CM | POA: Diagnosis not present

## 2021-05-06 DIAGNOSIS — Z7901 Long term (current) use of anticoagulants: Secondary | ICD-10-CM | POA: Diagnosis not present

## 2021-05-06 DIAGNOSIS — I25119 Atherosclerotic heart disease of native coronary artery with unspecified angina pectoris: Secondary | ICD-10-CM | POA: Diagnosis not present

## 2021-05-06 DIAGNOSIS — I10 Essential (primary) hypertension: Secondary | ICD-10-CM | POA: Diagnosis not present

## 2021-05-06 DIAGNOSIS — I5032 Chronic diastolic (congestive) heart failure: Secondary | ICD-10-CM | POA: Diagnosis not present

## 2021-05-12 DIAGNOSIS — I484 Atypical atrial flutter: Secondary | ICD-10-CM | POA: Diagnosis not present

## 2021-05-12 DIAGNOSIS — Z7901 Long term (current) use of anticoagulants: Secondary | ICD-10-CM | POA: Diagnosis not present

## 2021-05-12 DIAGNOSIS — I10 Essential (primary) hypertension: Secondary | ICD-10-CM | POA: Diagnosis not present

## 2021-05-12 DIAGNOSIS — I2510 Atherosclerotic heart disease of native coronary artery without angina pectoris: Secondary | ICD-10-CM | POA: Diagnosis not present

## 2021-05-13 ENCOUNTER — Ambulatory Visit: Payer: Medicare Other | Admitting: Cardiology

## 2021-06-10 ENCOUNTER — Other Ambulatory Visit: Payer: Self-pay | Admitting: Cardiology

## 2021-06-10 NOTE — Telephone Encounter (Signed)
Prescription refill request for Eliquis received. Indication: PAF Last office visit: 01/02/21  Verita Schneiders FNP Scr: 1.57 on 11/04/20 Age: 81 Weight: 95.9kg  Based on above findings Eliquis 5mg  twice daily is the appropriate dose.  Refill approved.  Pt has appt with Dr on 07/11/21.  Labs requested at that time.

## 2021-07-09 ENCOUNTER — Other Ambulatory Visit (HOSPITAL_COMMUNITY)
Admission: RE | Admit: 2021-07-09 | Discharge: 2021-07-09 | Disposition: A | Discharge status: Home / Self Care | Payer: Medicare Other | Source: Ambulatory Visit | Attending: Cardiology | Admitting: Cardiology

## 2021-07-09 DIAGNOSIS — Z79899 Other long term (current) drug therapy: Secondary | ICD-10-CM | POA: Diagnosis present

## 2021-07-09 DIAGNOSIS — N183 Chronic kidney disease, stage 3 unspecified: Secondary | ICD-10-CM | POA: Insufficient documentation

## 2021-07-09 DIAGNOSIS — I428 Other cardiomyopathies: Secondary | ICD-10-CM | POA: Diagnosis present

## 2021-07-09 DIAGNOSIS — I5042 Chronic combined systolic (congestive) and diastolic (congestive) heart failure: Secondary | ICD-10-CM | POA: Insufficient documentation

## 2021-07-09 LAB — BASIC METABOLIC PANEL
Anion gap: 5 (ref 5–15)
BUN: 24 mg/dL — ABNORMAL HIGH (ref 8–23)
CO2: 23 mmol/L (ref 22–32)
Calcium: 9.6 mg/dL (ref 8.9–10.3)
Chloride: 115 mmol/L — ABNORMAL HIGH (ref 98–111)
Creatinine, Ser: 0.96 mg/dL (ref 0.44–1.00)
GFR, Estimated: >60 mL/min (ref >60)
Glucose, Bld: 100 mg/dL — ABNORMAL HIGH (ref 70–99)
Potassium: 3.7 mmol/L (ref 3.5–5.1)
Sodium: 143 mmol/L (ref 135–145)

## 2021-07-09 LAB — MAGNESIUM: Magnesium: 2 mg/dL (ref 1.7–2.4)

## 2021-07-10 NOTE — Progress Notes (Signed)
? ? ?Cardiology Office Note ? ?Date: 07/11/2021  ? ?ID: [name], DOB 1940-06-07, MRN [id number] ? ?PCP:  [name], MD  ?Cardiologist:  [name], MD ?Electrophysiologist:  None  ? ?Chief Complaint  ?Patient presents with  ? Cardiac follow-up  ? ? ?History of Present Illness: ?[name] is a 81 y.o. female last seen in September 2022 by Mr. Vincenza Hews NP.  She is here for a routine visit.  She reports NYHA class II-III dyspnea depending on level of activity, no exertional chest pain or palpitations. ? ?I reviewed her recent lab work as outlined below.  Renal function normal at this point.  We went over her medications which are listed below.  She reports compliance with therapy.  States that she weighs herself on most days, has not seen any significant weight change.  Her weights are stable by our scales. ? ?I personally reviewed her ECG today which shows rate controlled atrial flutter with variable conduction and IVCD. ? ?Her last echocardiogram was in April 2022 at which point LVEF was 30 to 35%. ? ?Past Medical History:  ?Diagnosis Date  ? Anemia   ? Cardiomyopathy (HCC)   ? CHF (congestive heart failure) (HCC)   ? EF 30-35% in 2022  ? Chronic insomnia   ? Coronary atherosclerosis of native coronary artery   ? 60% LAD in 2002  ? Diverticulitis 2015  ? DJD (degenerative joint disease)   ? Left THA  ? Essential hypertension   ? GERD (gastroesophageal reflux disease)   ? Hyperlipidemia   ? Hypothyroidism   ? Left bundle branch block   ? Nephrolithiasis   ? Obesity   ? Paroxysmal atrial flutter (HCC)   ? Documented January 2018  ? ? ?Past Surgical History:  ?Procedure Laterality Date  ? ABDOMINAL HYSTERECTOMY    ? (partial)  ? APPENDECTOMY    ? CHOLECYSTECTOMY    ? COLONOSCOPY  Aug 2013  ? Dr. Loreta Ave, [facility]: small internal hemorrhoids and few scattered sigmoid diverticula, otherwise normal up to TI, TI appeared normal  ? ESOPHAGOGASTRODUODENOSCOPY N/A 06/01/2012  ? RMR:5 cm hiatal  hernia. Essentially normal esophagus  ? HEMORROIDECTOMY    ? JOINT REPLACEMENT    ? NOSE SURGERY    ? REVISION TOTAL HIP ARTHROPLASTY  2009  ? Left THA 2009  ? ? ?Current Outpatient Medications  ?Medication Sig Dispense Refill  ? apixaban (ELIQUIS) 5 MG TABS tablet Take 1 tablet by mouth twice daily 60 tablet 6  ? Calcium Carbonate-Vitamin D (CALCIUM 600 + D PO) Take 1 tablet by mouth daily.     ? dapagliflozin propanediol (FARXIGA) 10 MG TABS tablet Take 10 mg by mouth daily.    ? gabapentin (NEURONTIN) 100 MG capsule Take 100 mg by mouth at bedtime.    ? isosorbide mononitrate (IMDUR) 30 MG 24 hr tablet Take 1 tablet by mouth every day 30 tablet 11  ? metoprolol succinate (TOPROL-XL) 100 MG 24 hr tablet Take 1 tablet by mouth every day after a meal 30 tablet 11  ? Multiple Vitamin (MULTIVITAMIN WITH MINERALS) TABS Take 1 tablet by mouth daily.    ? nitroGLYCERIN (NITROSTAT) 0.4 MG SL tablet Place 1 tablet (0.4 mg total) under the tongue every 5 (five) minutes as needed. For chest pain 25 tablet 3  ? omeprazole (PRILOSEC) 20 MG capsule Take 20 mg by mouth every morning.    ? pravastatin (PRAVACHOL) 40 MG tablet Take 40 mg by mouth at bedtime.    ?  sacubitril-valsartan (ENTRESTO) 49-51 MG Take 1 tablet by mouth 2 (two) times daily.    ? spironolactone (ALDACTONE) 25 MG tablet Take 25 mg by mouth daily. Take one half tablet by mouth every day    ? tamsulosin (FLOMAX) 0.4 MG CAPS capsule Take 1 capsule (0.4 mg total) by mouth daily. 30 capsule 0  ? torsemide (DEMADEX) 20 MG tablet Take 20 mg by mouth daily. Take one to two tablets by mouth every day. Alternate 20 mg and 40 mg daily.    ? ?No current facility-administered medications for this visit.  ? ?Allergies:  Codeine and Tape  ? ?ROS: No orthopnea or PND.  Chronic leg swelling. ? ?Physical Exam: ?VS:  BP 136/80   Pulse 73   Ht 5' 2.5" (1.588 m)   Wt 209 lb (94.8 kg)   SpO2 99%   BMI 37.62 kg/m? , BMI Body mass index is 37.62 kg/m?. ? ?Wt Readings from Last 3  Encounters:  ?07/11/21 209 lb (94.8 kg)  ?04/18/21 208 lb (94.3 kg)  ?01/02/21 211 lb 6.4 oz (95.9 kg)  ?  ?General: Patient appears comfortable at rest. ?HEENT: Conjunctiva and lids normal. ?Neck: Supple, no elevated JVP or carotid bruits, no thyromegaly. ?Lungs: Clear to auscultation, nonlabored breathing at rest. ?Cardiac: Irregular, no S3 or significant systolic murmur, no pericardial rub. ?Extremities: Increased adiposity lower legs.. ? ?ECG:  An ECG dated 07/29/2020 was personally reviewed today and demonstrated:  Atrial flutter with 4:1 block and IVCD. ? ?Recent Labwork: ?12/17/2020: Hemoglobin 9.7; Platelets 235 ?07/09/2021: BUN 24; Creatinine, Ser 0.96; Magnesium 2.0; Potassium 3.7; Sodium 143  ? ?Other Studies Reviewed Today: ? ?Echocardiogram 07/24/2020: ? 1. Left ventricular ejection fraction, by estimation, is 30 to 35%. The  ?left ventricle has moderately decreased function. The left ventricle  ?demonstrates global hypokinesis. Left ventricular diastolic parameters are  ?consistent with Grade III diastolic  ?dysfunction (restrictive). Elevated left atrial pressure. The average left  ?ventricular global longitudinal strain is 13.8 %. The global longitudinal  ?strain is abnormal.  ? 2. Right ventricular systolic function is low normal. The right  ?ventricular size is mildly enlarged.  ? 3. Left atrial size was severely dilated.  ? 4. Right atrial size was severely dilated.  ? 5. The pericardial effusion is circumferential.  ? 6. The mitral valve is abnormal. Moderate mitral valve regurgitation. No  ?evidence of mitral stenosis.  ? 7. The tricuspid valve is abnormal. Tricuspid valve regurgitation is mild  ?to moderate.  ? 8. The aortic valve is tricuspid. There is mild calcification of the  ?aortic valve. There is mild thickening of the aortic valve. Aortic valve  ?regurgitation is not visualized. No aortic stenosis is present.  ? 9. The inferior vena cava is normal in size with greater than 50%  ?respiratory  variability, suggesting right atrial pressure of 3 mmHg.  ? ?Assessment and Plan: ? ?1.  HFrEF with nonischemic cardiomyopathy and LVEF 30 to 35% by last assessment.  She is clinically stable, NYHA class II-III dyspnea depending on level of activity.  No orthopnea or PND and weight stable.  Plan to obtain a follow-up echocardiogram.  Current regimen now includes Toprol-XL, Entresto, Aldactone, Demadex, and [place].  Not requiring potassium supplement so far.  Recent BMET reviewed. ? ?2.  Permanent atrial flutter with variable conduction and controlled heart rate on Toprol-XL.  CHA2DS2-VASc score is 6.  She remains on Eliquis for stroke prophylaxis. ? ?3.  Moderate nonobstructive CAD without active angina on current regimen.  Continue Toprol-XL, Imdur, and Pravachol. ? ?Medication Adjustments/Labs and Tests Ordered: ?Current medicines are reviewed at length with the patient today.  Concerns regarding medicines are outlined above.  ? ?Tests Ordered: ?Orders Placed This Encounter  ?Procedures  ? ECHOCARDIOGRAM COMPLETE  ? ? ?Medication Changes: ?No orders of the defined types were placed in this encounter. ? ? ?Disposition:  Follow up  3 months. ? ?Signed, ?Jillian Sidle, MD, [facility] ?07/11/2021 8:37 AM    ?[facility] at [facility] ?[address] [place], [place], [place] [address] ?Phone: [phone]; Fax: [phone]  ?

## 2021-07-11 ENCOUNTER — Encounter: Payer: Self-pay | Admitting: Cardiology

## 2021-07-11 ENCOUNTER — Ambulatory Visit (INDEPENDENT_AMBULATORY_CARE_PROVIDER_SITE_OTHER): Payer: Medicare Other | Admitting: Cardiology

## 2021-07-11 VITALS — BP 136/80 | HR 73 | Ht 62.5 in | Wt 209.0 lb

## 2021-07-11 DIAGNOSIS — I483 Typical atrial flutter: Secondary | ICD-10-CM

## 2021-07-11 DIAGNOSIS — I428 Other cardiomyopathies: Secondary | ICD-10-CM

## 2021-07-11 DIAGNOSIS — I25119 Atherosclerotic heart disease of native coronary artery with unspecified angina pectoris: Secondary | ICD-10-CM | POA: Diagnosis not present

## 2021-07-11 DIAGNOSIS — I5020 Unspecified systolic (congestive) heart failure: Secondary | ICD-10-CM

## 2021-07-11 NOTE — Addendum Note (Signed)
Addended by: Kerney Elbe on: 07/11/2021 09:06 AM ? ? Modules accepted: Orders ? ?

## 2021-07-11 NOTE — Patient Instructions (Signed)

## 2021-08-18 DIAGNOSIS — I484 Atypical atrial flutter: Secondary | ICD-10-CM | POA: Diagnosis not present

## 2021-08-18 DIAGNOSIS — I480 Paroxysmal atrial fibrillation: Secondary | ICD-10-CM | POA: Diagnosis not present

## 2021-08-18 DIAGNOSIS — Z7901 Long term (current) use of anticoagulants: Secondary | ICD-10-CM | POA: Diagnosis not present

## 2021-08-18 DIAGNOSIS — I5032 Chronic diastolic (congestive) heart failure: Secondary | ICD-10-CM | POA: Diagnosis not present

## 2021-08-18 DIAGNOSIS — I428 Other cardiomyopathies: Secondary | ICD-10-CM | POA: Diagnosis not present

## 2021-08-18 DIAGNOSIS — I25119 Atherosclerotic heart disease of native coronary artery with unspecified angina pectoris: Secondary | ICD-10-CM | POA: Diagnosis not present

## 2021-09-10 DIAGNOSIS — I2510 Atherosclerotic heart disease of native coronary artery without angina pectoris: Secondary | ICD-10-CM | POA: Diagnosis not present

## 2021-09-10 DIAGNOSIS — I10 Essential (primary) hypertension: Secondary | ICD-10-CM | POA: Diagnosis not present

## 2021-09-18 ENCOUNTER — Ambulatory Visit (INDEPENDENT_AMBULATORY_CARE_PROVIDER_SITE_OTHER): Payer: Medicare Other

## 2021-09-18 DIAGNOSIS — I428 Other cardiomyopathies: Secondary | ICD-10-CM

## 2021-09-18 LAB — ECHOCARDIOGRAM COMPLETE
AR max vel: 1.39 cm2
AV Area VTI: 1.68 cm2
AV Area mean vel: 1.51 cm2
AV Mean grad: 3.2 mmHg
AV Peak grad: 7.1 mmHg
Ao pk vel: 1.33 m/s
Area-P 1/2: 4.8 cm2
Calc EF: 45.4 %
MV M vel: 5.17 m/s
MV Peak grad: 107.1 mmHg
Radius: 0.4 cm
S' Lateral: 3.59 cm
Single Plane A2C EF: 48.1 %
Single Plane A4C EF: 39.9 %

## 2021-09-23 DIAGNOSIS — I4811 Longstanding persistent atrial fibrillation: Secondary | ICD-10-CM | POA: Diagnosis not present

## 2021-09-23 DIAGNOSIS — I10 Essential (primary) hypertension: Secondary | ICD-10-CM | POA: Diagnosis not present

## 2021-09-23 DIAGNOSIS — J219 Acute bronchiolitis, unspecified: Secondary | ICD-10-CM | POA: Diagnosis not present

## 2021-09-29 DIAGNOSIS — R5383 Other fatigue: Secondary | ICD-10-CM | POA: Diagnosis not present

## 2021-09-29 DIAGNOSIS — J219 Acute bronchiolitis, unspecified: Secondary | ICD-10-CM | POA: Diagnosis not present

## 2021-09-29 DIAGNOSIS — I10 Essential (primary) hypertension: Secondary | ICD-10-CM | POA: Diagnosis not present

## 2021-10-07 ENCOUNTER — Other Ambulatory Visit: Payer: Self-pay | Admitting: Cardiology

## 2021-10-10 DIAGNOSIS — I2510 Atherosclerotic heart disease of native coronary artery without angina pectoris: Secondary | ICD-10-CM | POA: Diagnosis not present

## 2021-10-10 DIAGNOSIS — I10 Essential (primary) hypertension: Secondary | ICD-10-CM | POA: Diagnosis not present

## 2021-10-13 DIAGNOSIS — N390 Urinary tract infection, site not specified: Secondary | ICD-10-CM | POA: Diagnosis not present

## 2021-10-13 DIAGNOSIS — J219 Acute bronchiolitis, unspecified: Secondary | ICD-10-CM | POA: Diagnosis not present

## 2021-10-13 DIAGNOSIS — N183 Chronic kidney disease, stage 3 unspecified: Secondary | ICD-10-CM | POA: Diagnosis not present

## 2021-10-13 DIAGNOSIS — I959 Hypotension, unspecified: Secondary | ICD-10-CM | POA: Diagnosis not present

## 2021-10-13 NOTE — Progress Notes (Unsigned)
Cardiology Office Note  Date: 10/16/2021   ID: [name], [name] 09-26-1940, MRN [id number]  PCP:  Jillian Mires, MD  Cardiologist:  Jillian Dell, MD Electrophysiologist:  None   Chief Complaint  Patient presents with   Cardiac follow-up    History of Present Illness: [name] is a 81 y.o. female last seen in March.  She is here for a follow-up visit.  States that she has been fatigued, not necessarily any worsening shortness of breath or weight gain however.  She saw Dr. [name] recently who recommended stopping her Demadex, we are requesting interval lab work for review.  Recent follow-up echocardiogram showed improvement in LVEF to the range of 40 to 45% with global hypokinesis, normal RV contraction, moderate biatrial enlargement, moderate mitral regurgitation, and sclerotic aortic valve without stenosis.  I reviewed the remainder of her cardiac regimen which is stable and outlined below.  Past Medical History:  Diagnosis Date   Anemia    Cardiomyopathy (HCC)    CHF (congestive heart failure) (HCC)    EF 30-35% in 2022   Chronic insomnia    Coronary atherosclerosis of native coronary artery    60% LAD in 2002   Diverticulitis 2015   DJD (degenerative joint disease)    Left THA   Essential hypertension    GERD (gastroesophageal reflux disease)    Hyperlipidemia    Hypothyroidism    Left bundle branch block    Nephrolithiasis    Obesity    Paroxysmal atrial flutter [facility])    Documented January 2018    Past Surgical History:  Procedure Laterality Date   ABDOMINAL HYSTERECTOMY     (partial)   APPENDECTOMY     CHOLECYSTECTOMY     COLONOSCOPY  Aug 2013   Dr. Loreta Ave, [facility]: small internal hemorrhoids and few scattered sigmoid diverticula, otherwise normal up to TI, TI appeared normal   ESOPHAGOGASTRODUODENOSCOPY N/A 06/01/2012   RMR:5 cm hiatal hernia. Essentially normal esophagus   HEMORROIDECTOMY     JOINT REPLACEMENT     NOSE  SURGERY     REVISION TOTAL HIP ARTHROPLASTY  2009   Left THA 2009    Current Outpatient Medications  Medication Sig Dispense Refill   apixaban (ELIQUIS) 5 MG TABS tablet Take 1 tablet by mouth twice daily 60 tablet 6   Calcium Carbonate-Vitamin D (CALCIUM 600 + D PO) Take 1 tablet by mouth daily.      dapagliflozin propanediol (FARXIGA) 10 MG TABS tablet Take 10 mg by mouth daily.     ENTRESTO 49-51 MG Take 1 tablet by mouth twice daily 60 tablet 11   gabapentin (NEURONTIN) 100 MG capsule Take 100 mg by mouth at bedtime.     isosorbide mononitrate (IMDUR) 30 MG 24 hr tablet Take 1 tablet by mouth every day 30 tablet 11   metoprolol succinate (TOPROL-XL) 100 MG 24 hr tablet Take 1 tablet by mouth every day after a meal 30 tablet 11   Multiple Vitamin (MULTIVITAMIN WITH MINERALS) TABS Take 1 tablet by mouth daily.     nitroGLYCERIN (NITROSTAT) 0.4 MG SL tablet Place 1 tablet (0.4 mg total) under the tongue every 5 (five) minutes as needed. For chest pain 25 tablet 3   omeprazole (PRILOSEC) 20 MG capsule Take 20 mg by mouth every morning.     pravastatin (PRAVACHOL) 40 MG tablet Take 40 mg by mouth at bedtime.     spironolactone (ALDACTONE) 25 MG tablet Take 25 mg by mouth  daily. Take one half tablet by mouth every day     tamsulosin (FLOMAX) 0.4 MG CAPS capsule Take 1 capsule (0.4 mg total) by mouth daily. 30 capsule 0   torsemide (DEMADEX) 20 MG tablet Take 20 mg by mouth daily. Take one to two tablets by mouth every day. Alternate 20 mg and 40 mg daily.     No current facility-administered medications for this visit.   Allergies:  Codeine and Tape   ROS: No palpitations or syncope.  Physical Exam: VS:  BP 100/64   Pulse 69   Ht 5\' 3"  (1.6 m)   Wt 210 lb 3.2 oz (95.3 kg)   SpO2 97%   BMI 37.24 kg/m , BMI Body mass index is 37.24 kg/m.  Wt Readings from Last 3 Encounters:  10/16/21 210 lb 3.2 oz (95.3 kg)  07/11/21 209 lb (94.8 kg)  04/18/21 208 lb (94.3 kg)    General:  Patient appears comfortable at rest. HEENT: Conjunctiva and lids normal, oropharynx clear. Neck: Supple, no elevated JVP or carotid bruits, no thyromegaly. Lungs: Clear to auscultation, nonlabored breathing at rest. Cardiac: Largely regular, no S3 or significant systolic murmur, no pericardial rub. Extremities: No progressive leg edema.  ECG:  An ECG dated 07/11/2021 was personally reviewed today and demonstrated:  Atrial flutter with variable conduction and IVCD.  Recent Labwork: 12/17/2020: Hemoglobin 9.7; Platelets 235 07/09/2021: BUN 24; Creatinine, Ser 0.96; Magnesium 2.0; Potassium 3.7; Sodium 143     Component Value Date/Time   CHOL 155 06/17/2009 2016   TRIG 97 06/17/2009 2016   HDL 62 06/17/2009 2016   CHOLHDL 2.5 Ratio 06/17/2009 2016   VLDL 19 06/17/2009 2016   LDLCALC 74 06/17/2009 2016    Other Studies Reviewed Today:  Echocardiogram 09/18/2021:  1. Left ventricular ejection fraction, by estimation, is 40 to 45%. The  left ventricle has mildly decreased function. The left ventricle  demonstrates global hypokinesis. Left ventricular diastolic parameters are  indeterminate.   2. Right ventricular systolic function is normal. The right ventricular  size is normal. There is normal pulmonary artery systolic pressure.   3. Left atrial size was moderately dilated.   4. Right atrial size was moderately dilated.   5. A small pericardial effusion is present. The pericardial effusion is  circumferential.   6. The mitral valve is abnormal. Moderate mitral valve regurgitation. No  evidence of mitral stenosis.   7. The tricuspid valve is abnormal.   8. The aortic valve is tricuspid. There is mild calcification of the  aortic valve. There is mild thickening of the aortic valve. Aortic valve  regurgitation is not visualized. No aortic stenosis is present.   9. Aortic dilatation noted. There is mild dilatation of the ascending  aorta, measuring 37 mm.  10. The inferior vena cava is  normal in size with greater than 50%  respiratory variability, suggesting right atrial pressure of 3 mmHg.   Assessment and Plan:  1.  HFrEF with nonischemic cardiomyopathy, LVEF recently improved to the range of 40 to 45% on medical therapy.  Demadex cut back to as needed use at this point, requesting interval lab work from Dr. 11/18/2021.  Otherwise continue Entresto, Toprol-XL, Aldactone, and [name].  2.  Permanent atrial flutter with variable conduction and controlled heart rate on Toprol-XL.  She is on Eliquis with CHA2DS2-VASc score of 6.  No reported spontaneous bleeding problems.  3.  Nonobstructive CAD, no active angina symptoms.  Continue Imdur, Toprol-XL, and Pravachol.  Medication Adjustments/Labs and  Tests Ordered: Current medicines are reviewed at length with the patient today.  Concerns regarding medicines are outlined above.   Tests Ordered: No orders of the defined types were placed in this encounter.   Medication Changes: No orders of the defined types were placed in this encounter.   Disposition:  Follow up  4 months.  Signed, Jonelle Sidle, MD, [facility] 10/16/2021 11:41 AM    [facility] at [facility] [address] [place], [place], [place] [address] Phone: [phone]; Fax: [phone]

## 2021-10-16 ENCOUNTER — Encounter: Payer: Self-pay | Admitting: Cardiology

## 2021-10-16 ENCOUNTER — Ambulatory Visit (INDEPENDENT_AMBULATORY_CARE_PROVIDER_SITE_OTHER): Payer: Medicare Other | Admitting: Cardiology

## 2021-10-16 VITALS — BP 100/64 | HR 69 | Ht 63.0 in | Wt 210.2 lb

## 2021-10-16 DIAGNOSIS — I483 Typical atrial flutter: Secondary | ICD-10-CM | POA: Diagnosis not present

## 2021-10-16 DIAGNOSIS — I5020 Unspecified systolic (congestive) heart failure: Secondary | ICD-10-CM

## 2021-10-16 DIAGNOSIS — I2510 Atherosclerotic heart disease of native coronary artery without angina pectoris: Secondary | ICD-10-CM

## 2021-10-16 NOTE — Patient Instructions (Signed)
Medication Instructions:  Your physician has recommended you make the following change in your medication:  Demadex 20 mg as needed for leg swelling and weight gain Continue all other medications as directed  Labwork: none  Testing/Procedures: none  Follow-Up:  Your physician recommends that you schedule a follow-up appointment in: 4 months  Any Other Special Instructions Will Be Listed Below (If Applicable).  If you need a refill on your cardiac medications before your next appointment, please call your pharmacy.

## 2021-10-18 ENCOUNTER — Other Ambulatory Visit: Payer: Self-pay | Admitting: Cardiology

## 2021-10-27 DIAGNOSIS — N390 Urinary tract infection, site not specified: Secondary | ICD-10-CM | POA: Diagnosis not present

## 2021-10-27 DIAGNOSIS — J219 Acute bronchiolitis, unspecified: Secondary | ICD-10-CM | POA: Diagnosis not present

## 2021-11-10 DIAGNOSIS — I2510 Atherosclerotic heart disease of native coronary artery without angina pectoris: Secondary | ICD-10-CM | POA: Diagnosis not present

## 2021-11-10 DIAGNOSIS — I10 Essential (primary) hypertension: Secondary | ICD-10-CM | POA: Diagnosis not present

## 2021-11-10 DIAGNOSIS — I484 Atypical atrial flutter: Secondary | ICD-10-CM | POA: Diagnosis not present

## 2021-12-12 ENCOUNTER — Other Ambulatory Visit: Payer: Self-pay | Admitting: Cardiology

## 2022-01-06 ENCOUNTER — Other Ambulatory Visit: Payer: Self-pay | Admitting: Cardiology

## 2022-01-06 NOTE — Telephone Encounter (Signed)
Prescription refill request for Eliquis received. Indication: A Flutter Last office visit: 10/16/21  Myles Gip MD Scr:  0.96 on 07/09/21 Age: 81 Weight: 95.3kg  Based on above findings Eliquis 5mg  twice daily is the appropriate dose.  Refill approved.

## 2022-01-11 ENCOUNTER — Other Ambulatory Visit: Payer: Self-pay | Admitting: Cardiology

## 2022-01-27 DIAGNOSIS — I10 Essential (primary) hypertension: Secondary | ICD-10-CM | POA: Diagnosis not present

## 2022-01-27 DIAGNOSIS — N183 Chronic kidney disease, stage 3 unspecified: Secondary | ICD-10-CM | POA: Diagnosis not present

## 2022-01-27 DIAGNOSIS — I25119 Atherosclerotic heart disease of native coronary artery with unspecified angina pectoris: Secondary | ICD-10-CM | POA: Diagnosis not present

## 2022-01-27 DIAGNOSIS — I4811 Longstanding persistent atrial fibrillation: Secondary | ICD-10-CM | POA: Diagnosis not present

## 2022-01-27 DIAGNOSIS — I484 Atypical atrial flutter: Secondary | ICD-10-CM | POA: Diagnosis not present

## 2022-01-27 DIAGNOSIS — I5032 Chronic diastolic (congestive) heart failure: Secondary | ICD-10-CM | POA: Diagnosis not present

## 2022-01-27 DIAGNOSIS — I130 Hypertensive heart and chronic kidney disease with heart failure and stage 1 through stage 4 chronic kidney disease, or unspecified chronic kidney disease: Secondary | ICD-10-CM | POA: Diagnosis not present

## 2022-02-07 ENCOUNTER — Other Ambulatory Visit: Payer: Self-pay | Admitting: Cardiology

## 2022-02-17 ENCOUNTER — Encounter: Payer: Self-pay | Admitting: Student

## 2022-02-17 ENCOUNTER — Ambulatory Visit: Payer: Medicare Other | Attending: Student | Admitting: Student

## 2022-02-17 VITALS — BP 122/76 | HR 85 | Ht 63.0 in | Wt 226.2 lb

## 2022-02-17 DIAGNOSIS — I483 Typical atrial flutter: Secondary | ICD-10-CM | POA: Diagnosis not present

## 2022-02-17 DIAGNOSIS — I10 Essential (primary) hypertension: Secondary | ICD-10-CM

## 2022-02-17 DIAGNOSIS — N183 Chronic kidney disease, stage 3 unspecified: Secondary | ICD-10-CM | POA: Diagnosis not present

## 2022-02-17 DIAGNOSIS — I2510 Atherosclerotic heart disease of native coronary artery without angina pectoris: Secondary | ICD-10-CM | POA: Diagnosis not present

## 2022-02-17 DIAGNOSIS — I5020 Unspecified systolic (congestive) heart failure: Secondary | ICD-10-CM | POA: Diagnosis not present

## 2022-02-17 DIAGNOSIS — E785 Hyperlipidemia, unspecified: Secondary | ICD-10-CM | POA: Diagnosis not present

## 2022-02-17 NOTE — Progress Notes (Signed)
Cardiology Office Note    Date:  02/17/2022   ID:  MAIDA WIDGER, DOB [birth year], MRN [id number]  PCP:  [name], MD  Cardiologist: [name], MD    Chief Complaint  Patient presents with   Follow-up    4 month visit    History of Present Illness:    [name] is a 81 y.o. female with past medical history of HFrEF (EF 40-45% in 2018, 35% in 11/2019 and 07/2020, at 40-45% by echo in 09/2021), CAD (moderate disease by catheterization in 2002, NST in 2017 showing possible mild anterior septal and apical ischemia and medical management pursued), paroxysmal atrial fibrillation/flutter, LBBB, Stage 3 CKD, HTN, HLD and hypothyroidism who presents to the office today for 11-month follow-up.  She was last examined by Dr. Diona Browner in 10/2021 and recent echocardiogram had shown her EF had improved to 40 to 45% and she did have moderate mitral valve regurgitation. She reported fatigue but denied any specific changes in her respiratory status. She was continued on her current cardiac medications including Eliquis 5 mg twice daily, Farxiga 10 mg daily, Entresto 49-51 mg twice daily, Imdur 30 mg daily, Toprol-XL 100 mg daily, Pravastatin 40 mg daily, Spironolactone 12.5 mg daily and Torsemide 20 mg daily alternating with 40 mg daily.  In talking with the patient today, she reports having fatigue over the past few months which she feels like has progressed. She is unsure if this is due to a cardiac issue or increased activity as she reports staying active at baseline. She enjoys going to local dances at least 3-4 nights per week but says she typically just listens to the music and does not actively dance. Reports her breathing has overall been stable and denies any recent chest pain. No specific palpitations, orthopnea or PND. Has chronic edema which has been stable. Her weight has increased by almost 15 pounds since her last office visit but she reports this has been gradual and feels  like it is secondary to dietary changes as she has been consuming more sweets. Also reports her daughter has been in town from [place] they have been consuming larger meals.  Past Medical History:  Diagnosis Date   Anemia    Cardiomyopathy (HCC)    CHF (congestive heart failure) (HCC)    a. EF 40-45% in 2018 b. 35% in 11/2019 and 07/2020 c. EF at 40-45% by echo in 09/2021   Chronic insomnia    Coronary atherosclerosis of native coronary artery    60% LAD in 2002   Diverticulitis 2015   DJD (degenerative joint disease)    Left THA   Essential hypertension    GERD (gastroesophageal reflux disease)    Hyperlipidemia    Hypothyroidism    Left bundle branch block    Nephrolithiasis    Obesity    Paroxysmal atrial flutter (HCC)    Documented January 2018    Past Surgical History:  Procedure Laterality Date   ABDOMINAL HYSTERECTOMY     (partial)   APPENDECTOMY     CHOLECYSTECTOMY     COLONOSCOPY  Aug 2013   Dr. Loreta Ave, [facility]: small internal hemorrhoids and few scattered sigmoid diverticula, otherwise normal up to TI, TI appeared normal   ESOPHAGOGASTRODUODENOSCOPY N/A 06/01/2012   RMR:5 cm hiatal hernia. Essentially normal esophagus   HEMORROIDECTOMY     JOINT REPLACEMENT     NOSE SURGERY     REVISION TOTAL HIP ARTHROPLASTY  2009   Left THA 2009  Current Medications: Outpatient Medications Prior to Visit  Medication Sig Dispense Refill   apixaban (ELIQUIS) 5 MG TABS tablet Take 1 tablet by mouth twice daily 60 tablet 5   Calcium Carbonate-Vitamin D (CALCIUM 600 + D PO) Take 1 tablet by mouth daily.      ENTRESTO 49-51 MG Take 1 tablet by mouth twice daily 60 tablet 11   FARXIGA 10 MG TABS tablet Take 1 tablet by mouth every day 30 tablet 11   gabapentin (NEURONTIN) 100 MG capsule Take 100 mg by mouth at bedtime.     isosorbide mononitrate (IMDUR) 30 MG 24 hr tablet Take 1 tablet by mouth every day 30 tablet 11   metoprolol succinate (TOPROL-XL) 100 MG 24  hr tablet Take 1 tablet by mouth every day after a meal 30 tablet 11   Multiple Vitamin (MULTIVITAMIN WITH MINERALS) TABS Take 1 tablet by mouth daily.     nitroGLYCERIN (NITROSTAT) 0.4 MG SL tablet Place 1 tablet (0.4 mg total) under the tongue every 5 (five) minutes as needed. For chest pain 25 tablet 3   omeprazole (PRILOSEC) 20 MG capsule Take 20 mg by mouth every morning.     pravastatin (PRAVACHOL) 40 MG tablet Take 40 mg by mouth at bedtime.     spironolactone (ALDACTONE) 25 MG tablet Take one-half tablet by mouth every day 45 tablet 3   tamsulosin (FLOMAX) 0.4 MG CAPS capsule Take 1 capsule (0.4 mg total) by mouth daily. 30 capsule 0   torsemide (DEMADEX) 20 MG tablet Take 1 to 2 tablets by mouth every day, alternate 20mg  & 40mg  daily 45 tablet 6   No facility-administered medications prior to visit.     Allergies:   Codeine and Tape   Social History   Socioeconomic History   Marital status: Widowed    Spouse name: Not on file   Number of children: Not on file   Years of education: Not on file   Highest education level: Not on file  Occupational History   Occupation: retired    Comment: used to work at in    Employer: RETIRED  Tobacco Use   Smoking status: Never   Smokeless tobacco: Never  Vaping Use   Vaping Use: Never used  Substance and Sexual Activity   Alcohol use: No    Alcohol/week: 0.0 standard drinks of alcohol   Drug use: No   Sexual activity: Not on file  Other Topics Concern   Not on file  Social History Narrative   Not on file   Social Determinants of Health   Financial Resource Strain: Not on file  Food Insecurity: Not on file  Transportation Needs: Not on file  Physical Activity: Not on file  Stress: Not on file  Social Connections: Not on file     Family History:  The patient's family history includes Diabetes in her brother; Heart attack in her father and mother.   Review of Systems:    Please see the history of present  illness.     All other systems reviewed and are otherwise negative except as noted above.   Physical Exam:    VS:  BP 122/76   Pulse 85   Ht 5\' 3"  (1.6 m)   Wt 226 lb 3.2 oz (102.6 kg)   SpO2 96%   BMI 40.07 kg/m    General: Well developed, well nourished,female appearing in no acute distress. Head: Normocephalic, atraumatic. Neck: No carotid bruits. JVD not elevated.  Lungs:  Respirations regular and unlabored, without wheezes or rales.  Heart: Irregularly irregular. No S3 or S4.  2/6 holosystolic murmur along Apex.  Abdomen: Appears non-distended. No obvious abdominal masses. Msk:  Strength and tone appear normal for age. No obvious joint deformities or effusions. Extremities: No clubbing or cyanosis. Chronic appearing lymphedema.  Distal pedal pulses are 2+ bilaterally. Neuro: Alert and oriented X 3. Moves all extremities spontaneously. No focal deficits noted. Psych:  Responds to questions appropriately with a normal affect. Skin: No rashes or lesions noted  Wt Readings from Last 3 Encounters:  02/17/22 226 lb 3.2 oz (102.6 kg)  10/16/21 210 lb 3.2 oz (95.3 kg)  07/11/21 209 lb (94.8 kg)     Studies/Labs Reviewed:   EKG:  EKG is not ordered today.   Recent Labs: 07/09/2021: BUN 24; Creatinine, Ser 0.96; Magnesium 2.0; Potassium 3.7; Sodium 143   Lipid Panel    Component Value Date/Time   CHOL 155 06/17/2009 2016   TRIG 97 06/17/2009 2016   HDL 62 06/17/2009 2016   CHOLHDL 2.5 Ratio 06/17/2009 2016   VLDL 19 06/17/2009 2016   LDLCALC 74 06/17/2009 2016    Additional studies/ records that were reviewed today include:   Echocardiogram: 09/2021 MPRESSIONS     1. Left ventricular ejection fraction, by estimation, is 40 to 45%. The  left ventricle has mildly decreased function. The left ventricle  demonstrates global hypokinesis. Left ventricular diastolic parameters are  indeterminate.   2. Right ventricular systolic function is normal. The right ventricular   size is normal. There is normal pulmonary artery systolic pressure.   3. Left atrial size was moderately dilated.   4. Right atrial size was moderately dilated.   5. A small pericardial effusion is present. The pericardial effusion is  circumferential.   6. The mitral valve is abnormal. Moderate mitral valve regurgitation. No  evidence of mitral stenosis.   7. The tricuspid valve is abnormal.   8. The aortic valve is tricuspid. There is mild calcification of the  aortic valve. There is mild thickening of the aortic valve. Aortic valve  regurgitation is not visualized. No aortic stenosis is present.   9. Aortic dilatation noted. There is mild dilatation of the ascending  aorta, measuring 37 mm.  10. The inferior vena cava is normal in size with greater than 50%  respiratory variability, suggesting right atrial pressure of 3 mmHg.   Comparison(s): Echocardiogram done 07/24/20 showed an EF of 30-35%.   Assessment:    1. HFrEF (heart failure with reduced ejection fraction) (HCC)   2. CAD in native artery   3. Typical atrial flutter (HCC)   4. Stage 3 chronic kidney disease, unspecified whether stage 3a or 3b CKD (HCC)   5. Essential hypertension, benign   6. Hyperlipidemia LDL goal <70      Plan:   In order of problems listed above:  1. HFrEF - Her EF was at 35% in 11/2019 and 07/2020, at 40-45% by echo in 09/2021. Her weight has increased since her last office visit but she feels like this is secondary to dietary indiscretion as this has been gradual. She does have chronic lymphedema on examination today but lungs are clear. - Continue current medical therapy with Torsemide 20 mg daily alternating with 40 mg daily, Farxiga 10 mg daily, Imdur 30 mg daily, Toprol-XL 100 mg daily, Entresto 49-51 mg twice daily and Spironolactone 12.5 mg daily.  2. CAD - She had moderate disease by catheterization in 2002 with  NST in 2017 showing possible mild anterior septal and apical ischemia and  medical management pursued.  - She does report progressive fatigue and we did review the possibility of a repeat Lexiscan Myoview today given the timeframe since her last ischemic evaluation but she prefers to hold off on this for now and readdress at her next visit. I encouraged her to make [place] aware of any progressive symptoms in the interim. - Continue current medical therapy with Imdur 30 mg daily, Toprol-XL 100 mg daily and Pravastatin 40 mg daily. She is no longer on ASA given the need for anticoagulation.  3. Paroxysmal Atrial Fibrillation/Flutter - She denies any recent palpitations and her heart rate is well controlled in the 80's during today's visit. Continue Toprol-XL 100 mg daily. - No reports of active bleeding. She remains on Eliquis 5 mg twice daily for anticoagulation. We will request a copy of most recent labs to make sure a CBC and BMET were obtained.  4. Stage 3 CKD - Her creatinine had improved to 0.96 in 06/2021. She did have repeat labs with her PCP in the interim and we will request a copy of these.  5. HTN - Her BP is well-controlled at 122/76 during today's visit. Continue current medical therapy with Imdur 30 mg daily, Toprol-XL 100 mg daily, Entresto 49-51 mg twice daily and Spironolactone 12.5 mg daily.  6. HLD - She reports having recent labs with her PCP in the past few months and we will request a copy of these. She remains on Pravastatin 40 mg daily.    Medication Adjustments/Labs and Tests Ordered: Current medicines are reviewed at length with the patient today.  Concerns regarding medicines are outlined above.  Medication changes, Labs and Tests ordered today are listed in the Patient Instructions below. Patient Instructions  Medication Instructions:  Your physician recommends that you continue on your current medications as directed. Please refer to the Current Medication list given to you  today.   Labwork: None  Testing/Procedures: None  Follow-Up: Follow up with Dr. Domenic Polite in 4-5 months in the [facility].   Any Other Special Instructions Will Be Listed Below (If Applicable).     If you need a refill on your cardiac medications before your next appointment, please call your pharmacy.    Signed, Erma Heritage, PA-C  02/17/2022 4:38 PM    [facility] [facility] [address] [place], [place] [address] Phone: [phone] Fax: [phone]

## 2022-02-17 NOTE — Patient Instructions (Signed)
Medication Instructions:  Your physician recommends that you continue on your current medications as directed. Please refer to the Current Medication list given to you today.   Labwork: None  Testing/Procedures: None  Follow-Up: Follow up with Dr. Domenic Polite in 4-5 months in the [facility].   Any Other Special Instructions Will Be Listed Below (If Applicable).     If you need a refill on your cardiac medications before your next appointment, please call your pharmacy.

## 2022-04-28 ENCOUNTER — Other Ambulatory Visit (HOSPITAL_COMMUNITY): Payer: Self-pay | Admitting: Family Medicine

## 2022-04-28 DIAGNOSIS — Z1231 Encounter for screening mammogram for malignant neoplasm of breast: Secondary | ICD-10-CM

## 2022-05-11 ENCOUNTER — Ambulatory Visit (HOSPITAL_COMMUNITY)
Admission: RE | Admit: 2022-05-11 | Discharge: 2022-05-11 | Disposition: A | Discharge status: Home / Self Care | Payer: 59 | Source: Ambulatory Visit | Attending: Family Medicine | Admitting: Family Medicine

## 2022-05-11 DIAGNOSIS — Z1231 Encounter for screening mammogram for malignant neoplasm of breast: Secondary | ICD-10-CM | POA: Insufficient documentation

## 2022-05-26 DIAGNOSIS — I5032 Chronic diastolic (congestive) heart failure: Secondary | ICD-10-CM | POA: Diagnosis not present

## 2022-05-26 DIAGNOSIS — I4891 Unspecified atrial fibrillation: Secondary | ICD-10-CM | POA: Diagnosis not present

## 2022-05-26 DIAGNOSIS — I25119 Atherosclerotic heart disease of native coronary artery with unspecified angina pectoris: Secondary | ICD-10-CM | POA: Diagnosis not present

## 2022-05-26 DIAGNOSIS — I484 Atypical atrial flutter: Secondary | ICD-10-CM | POA: Diagnosis not present

## 2022-05-26 DIAGNOSIS — I130 Hypertensive heart and chronic kidney disease with heart failure and stage 1 through stage 4 chronic kidney disease, or unspecified chronic kidney disease: Secondary | ICD-10-CM | POA: Diagnosis not present

## 2022-05-26 DIAGNOSIS — N1831 Chronic kidney disease, stage 3a: Secondary | ICD-10-CM | POA: Diagnosis not present

## 2022-05-26 DIAGNOSIS — I10 Essential (primary) hypertension: Secondary | ICD-10-CM | POA: Diagnosis not present

## 2022-05-26 DIAGNOSIS — N183 Chronic kidney disease, stage 3 unspecified: Secondary | ICD-10-CM | POA: Diagnosis not present

## 2022-07-05 ENCOUNTER — Other Ambulatory Visit: Payer: Self-pay | Admitting: Cardiology

## 2022-07-06 NOTE — Telephone Encounter (Signed)
Prescription refill request for Eliquis received. Indication:  A Flutter Last office visit:  02/17/22  B Strader PA-C Scr: 1.08 on 05/26/22  KPN Age:  83 Weight: 102.6kg   Based on above findings Eliquis 5mg  twice daily is the appropriate dose.  Refill approved.

## 2022-07-13 NOTE — Progress Notes (Deleted)
    Cardiology Office Note  Date: 07/13/2022   ID: [name], DOB [birth year], MRN [id number]  History of Present Illness: [name] is a 82 y.o. female last seen in November 2023 by Ms. Strader PA-C, I reviewed the note.  Physical Exam: VS:  There were no vitals taken for this visit., BMI There is no height or weight on file to calculate BMI.  Wt Readings from Last 3 Encounters:  02/17/22 226 lb 3.2 oz (102.6 kg)  10/16/21 210 lb 3.2 oz (95.3 kg)  07/11/21 209 lb (94.8 kg)    General: Patient appears comfortable at rest. HEENT: Conjunctiva and lids normal, oropharynx clear with moist mucosa. Neck: Supple, no elevated JVP or carotid bruits, no thyromegaly. Lungs: Clear to auscultation, nonlabored breathing at rest. Cardiac: Regular rate and rhythm, no S3 or significant systolic murmur, no pericardial rub. Abdomen: Soft, nontender, no hepatomegaly, bowel sounds present, no guarding or rebound. Extremities: No pitting edema, distal pulses 2+. Skin: Warm and dry. Musculoskeletal: No kyphosis. Neuropsychiatric: Alert and oriented x3, affect grossly appropriate.  ECG:  An ECG dated 07/11/2021 was personally reviewed today and demonstrated:  Atrial flutter with variable AV block and heart rate 73, IVCD of left bundle branch block type.  Labwork:  September 2022: Hemoglobin 9.7, platelets 235 March 2023: Potassium 3.7, BUN 24, creatinine 0.96, magnesium 2.0  Other Studies Reviewed Today:  Echocardiogram 09/18/2021:  1. Left ventricular ejection fraction, by estimation, is 40 to 45%. The  left ventricle has mildly decreased function. The left ventricle  demonstrates global hypokinesis. Left ventricular diastolic parameters are  indeterminate.   2. Right ventricular systolic function is normal. The right ventricular  size is normal. There is normal pulmonary artery systolic pressure.   3. Left atrial size was moderately dilated.   4. Right atrial size was moderately  dilated.   5. A small pericardial effusion is present. The pericardial effusion is  circumferential.   6. The mitral valve is abnormal. Moderate mitral valve regurgitation. No  evidence of mitral stenosis.   7. The tricuspid valve is abnormal.   8. The aortic valve is tricuspid. There is mild calcification of the  aortic valve. There is mild thickening of the aortic valve. Aortic valve  regurgitation is not visualized. No aortic stenosis is present.   9. Aortic dilatation noted. There is mild dilatation of the ascending  aorta, measuring 37 mm.  10. The inferior vena cava is normal in size with greater than 50%  respiratory variability, suggesting right atrial pressure of 3 mmHg.   Assessment and Plan:  1.  HFmrEF with nonischemic cardiomyopathy, LVEF 40 to 45% with global hypokinesis by echocardiogram in June 2023.  2.  Permanent atrial flutter with CHA2DS2-VASc score of 6.  3.  History of nonobstructive CAD by cardiac catheterization in 2002, last formal ischemic testing was via [place] in 2017 demonstrating possible mild anteroseptal/apical ischemia managed medically.  4.  CKD stage IIIb.  5.  Essential hypertension.  Disposition:  Follow up {follow up:15908}  Signed, Satira Sark, M.D., F.A.C.C. [place] at [facility]

## 2022-07-14 ENCOUNTER — Ambulatory Visit: Payer: 59 | Attending: Cardiology | Admitting: Cardiology

## 2022-07-14 DIAGNOSIS — I5022 Chronic systolic (congestive) heart failure: Secondary | ICD-10-CM

## 2022-07-15 ENCOUNTER — Encounter: Payer: Self-pay | Admitting: Cardiology

## 2022-07-20 DIAGNOSIS — I10 Essential (primary) hypertension: Secondary | ICD-10-CM | POA: Diagnosis not present

## 2022-07-20 DIAGNOSIS — I25119 Atherosclerotic heart disease of native coronary artery with unspecified angina pectoris: Secondary | ICD-10-CM | POA: Diagnosis not present

## 2022-07-20 DIAGNOSIS — N1831 Chronic kidney disease, stage 3a: Secondary | ICD-10-CM | POA: Diagnosis not present

## 2022-07-20 DIAGNOSIS — I480 Paroxysmal atrial fibrillation: Secondary | ICD-10-CM | POA: Diagnosis not present

## 2022-07-20 DIAGNOSIS — I5032 Chronic diastolic (congestive) heart failure: Secondary | ICD-10-CM | POA: Diagnosis not present

## 2022-07-27 DIAGNOSIS — J029 Acute pharyngitis, unspecified: Secondary | ICD-10-CM | POA: Diagnosis not present

## 2022-08-09 ENCOUNTER — Other Ambulatory Visit: Payer: Self-pay | Admitting: Cardiology

## 2022-08-18 DIAGNOSIS — E039 Hypothyroidism, unspecified: Secondary | ICD-10-CM | POA: Diagnosis not present

## 2022-08-18 DIAGNOSIS — I5032 Chronic diastolic (congestive) heart failure: Secondary | ICD-10-CM | POA: Diagnosis not present

## 2022-08-18 DIAGNOSIS — I10 Essential (primary) hypertension: Secondary | ICD-10-CM | POA: Diagnosis not present

## 2022-08-18 DIAGNOSIS — R0602 Shortness of breath: Secondary | ICD-10-CM | POA: Diagnosis not present

## 2022-08-18 DIAGNOSIS — J029 Acute pharyngitis, unspecified: Secondary | ICD-10-CM | POA: Diagnosis not present

## 2022-08-18 DIAGNOSIS — I480 Paroxysmal atrial fibrillation: Secondary | ICD-10-CM | POA: Diagnosis not present

## 2022-08-22 ENCOUNTER — Other Ambulatory Visit: Payer: Self-pay | Admitting: Cardiology

## 2022-09-03 ENCOUNTER — Other Ambulatory Visit: Payer: Self-pay | Admitting: Cardiology

## 2022-09-18 ENCOUNTER — Encounter: Payer: Self-pay | Admitting: Cardiology

## 2022-09-18 ENCOUNTER — Ambulatory Visit: Payer: 59 | Attending: Cardiology | Admitting: Cardiology

## 2022-09-18 VITALS — BP 106/64 | HR 73 | Ht 63.0 in | Wt 204.0 lb

## 2022-09-18 DIAGNOSIS — I25119 Atherosclerotic heart disease of native coronary artery with unspecified angina pectoris: Secondary | ICD-10-CM

## 2022-09-18 DIAGNOSIS — I5020 Unspecified systolic (congestive) heart failure: Secondary | ICD-10-CM | POA: Diagnosis not present

## 2022-09-18 DIAGNOSIS — I4821 Permanent atrial fibrillation: Secondary | ICD-10-CM

## 2022-09-18 NOTE — Progress Notes (Signed)
Cardiology Office Note  Date: 09/18/2022   ID: [name], DOB Mar 09, 1941, MRN [id number]  History of Present Illness: [name] is an 82 y.o. female last seen in July 2023.  She is here for a routine visit.  Overall clinically stable with NYHA class II dyspnea, no exertional chest pain or palpitations, good control of leg edema.  Her weight is down over the last 6 months as noted below.  I reviewed her medications, she is currently taking Demadex at 20 mg daily.  Otherwise no change in regimen.  We discussed getting an updated echocardiogram in comparison to last year's study.  ECG today shows rate controlled atrial fibrillation with IVCD.  She is following routine lab work with Dr. [name].  Physical Exam: VS:  BP 106/64   Pulse 73   Ht 5\' 3"  (1.6 m)   Wt 204 lb (92.5 kg)   SpO2 95%   BMI 36.14 kg/m , BMI Body mass index is 36.14 kg/m.  Wt Readings from Last 3 Encounters:  09/18/22 204 lb (92.5 kg)  02/17/22 226 lb 3.2 oz (102.6 kg)  10/16/21 210 lb 3.2 oz (95.3 kg)    General: Patient appears comfortable at rest. HEENT: Conjunctiva and lids normal. Neck: Supple, no elevated JVP or carotid bruits. Lungs: Clear to auscultation, nonlabored breathing at rest. Cardiac: Irregularly irregular without gallop. Extremities: Mild ankle edema.  ECG:  An ECG dated 07/11/2021 was personally reviewed today and demonstrated:  Atrial flutter with variable block and controlled heart rate, IVCD.  Labwork:  March 2023: Magnesium 2, potassium 3.7, BUN 24, creatinine 0.96  Other Studies Reviewed Today:  Echocardiogram 09/18/2021:  1. Left ventricular ejection fraction, by estimation, is 40 to 45%. The  left ventricle has mildly decreased function. The left ventricle  demonstrates global hypokinesis. Left ventricular diastolic parameters are  indeterminate.   2. Right ventricular systolic function is normal. The right ventricular  size is normal. There is normal pulmonary  artery systolic pressure.   3. Left atrial size was moderately dilated.   4. Right atrial size was moderately dilated.   5. A small pericardial effusion is present. The pericardial effusion is  circumferential.   6. The mitral valve is abnormal. Moderate mitral valve regurgitation. No  evidence of mitral stenosis.   7. The tricuspid valve is abnormal.   8. The aortic valve is tricuspid. There is mild calcification of the  aortic valve. There is mild thickening of the aortic valve. Aortic valve  regurgitation is not visualized. No aortic stenosis is present.   9. Aortic dilatation noted. There is mild dilatation of the ascending  aorta, measuring 37 mm.  10. The inferior vena cava is normal in size with greater than 50%  respiratory variability, suggesting right atrial pressure of 3 mmHg.   Assessment and Plan:  1.  HFmrEF with history of nonischemic cardiomyopathy, LVEF 40 to 45% by echocardiogram in June 2023.  Clinically stable with NYHA class II dyspnea and no progressive fluid retention.  Weight is down.  Plan to update echocardiogram.  Continue Toprol-XL, Entresto, Farxiga, Aldactone, and Demadex.  2.  Permanent atrial flutter with CHA2DS2-VASc score of 6.  Looks to be in atrial fibrillation today with controlled heart rate, asymptomatic.  Continue Eliquis for stroke prophylaxis.  She denies any spontaneous bleeding problems.  3.  CAD with moderate LAD stenosis managed medically as of 2002.  No active angina at this time.  Continue Pravachol and as needed nitroglycerin.  4.  Essential hypertension.  Blood pressure is well-controlled today.  Disposition:  Follow up  6 months.  Signed, Jonelle Sidle, M.D., F.A.C.C. [facility] at [facility]

## 2022-09-18 NOTE — Patient Instructions (Signed)
Medication Instructions:  Your physician recommends that you continue on your current medications as directed. Please refer to the Current Medication list given to you today.    Labwork:   None today  Testing/Procedures: Echo   Follow-Up:  6 months  Any Other Special Instructions Will Be Listed Below (If Applicable).  If you need a refill on your cardiac medications before your next appointment, please call your pharmacy.

## 2022-09-30 ENCOUNTER — Other Ambulatory Visit: Payer: Self-pay

## 2022-09-30 ENCOUNTER — Emergency Department (HOSPITAL_COMMUNITY)
Admission: EM | Admit: 2022-09-30 | Discharge: 2022-09-30 | Disposition: A | Discharge status: Home / Self Care | Payer: 59 | Attending: Emergency Medicine | Admitting: Emergency Medicine

## 2022-09-30 ENCOUNTER — Encounter (HOSPITAL_COMMUNITY): Payer: Self-pay | Admitting: Emergency Medicine

## 2022-09-30 DIAGNOSIS — K047 Periapical abscess without sinus: Secondary | ICD-10-CM

## 2022-09-30 DIAGNOSIS — I2510 Atherosclerotic heart disease of native coronary artery without angina pectoris: Secondary | ICD-10-CM | POA: Insufficient documentation

## 2022-09-30 DIAGNOSIS — Z7901 Long term (current) use of anticoagulants: Secondary | ICD-10-CM | POA: Insufficient documentation

## 2022-09-30 DIAGNOSIS — K029 Dental caries, unspecified: Secondary | ICD-10-CM | POA: Diagnosis not present

## 2022-09-30 DIAGNOSIS — K0889 Other specified disorders of teeth and supporting structures: Secondary | ICD-10-CM | POA: Diagnosis present

## 2022-09-30 MED ORDER — PENICILLIN V POTASSIUM 250 MG PO TABS
500.0000 mg | ORAL_TABLET | Freq: Once | ORAL | Status: AC
  Administered 2022-09-30: 500 mg via ORAL
  Filled 2022-09-30: qty 2

## 2022-09-30 MED ORDER — HYDROCODONE-ACETAMINOPHEN 5-325 MG PO TABS: 1.0000 | ORAL_TABLET | Freq: Four times a day (QID) | ORAL | 0 refills | Status: AC | PRN

## 2022-09-30 MED ORDER — HYDROCODONE-ACETAMINOPHEN 5-325 MG PO TABS
1.0000 | ORAL_TABLET | Freq: Once | ORAL | Status: AC
  Administered 2022-09-30: 1 via ORAL
  Filled 2022-09-30: qty 1

## 2022-09-30 MED ORDER — PENICILLIN V POTASSIUM 500 MG PO TABS: 500.0000 mg | ORAL_TABLET | Freq: Four times a day (QID) | ORAL | 0 refills | Status: AC

## 2022-09-30 NOTE — ED Provider Notes (Signed)
[place] EMERGENCY DEPARTMENT AT [facility] Provider Note   CSN: [id number] Arrival date & time: 09/30/22  1608     History  Chief Complaint  Patient presents with   Dental Problem    [name] is a 82 y.o. female.  Patient has a history of coronary artery disease.  She now has swelling to her right face and dental pain  The history is provided by the patient and medical records. No language interpreter was used.  Dental Pain Location:  Upper Quality:  Aching Severity:  Moderate Onset quality:  Sudden Timing:  Constant Progression:  Worsening Chronicity:  New Context: abscess   Relieved by:  Nothing Worsened by:  Nothing Associated symptoms: no congestion and no headaches        Home Medications Prior to Admission medications   Medication Sig Start Date End Date Taking? Authorizing Provider  HYDROcodone-acetaminophen (NORCO/VICODIN) 5-325 MG tablet Take 1 tablet by mouth every 6 (six) hours as needed for moderate pain. 09/30/22  Yes Bethann Berkshire, MD  penicillin v potassium (VEETID) 500 MG tablet Take 1 tablet (500 mg total) by mouth 4 (four) times daily for 7 days. 09/30/22 10/07/22 Yes Bethann Berkshire, MD  apixaban Everlene Balls) 5 MG TABS tablet Take 1 tablet by mouth twice daily 07/06/22   Jonelle Sidle, MD  Calcium Carbonate-Vitamin D (CALCIUM 600 + D PO) Take 1 tablet by mouth daily.     [provider]  ENTRESTO 49-51 MG Take 1 tablet by mouth twice daily 08/10/22   Jonelle Sidle, MD  FARXIGA 10 MG TABS tablet Take 1 tablet by mouth every day 01/12/22   Jonelle Sidle, MD  gabapentin (NEURONTIN) 100 MG capsule Take 100 mg by mouth at bedtime.    [provider]  isosorbide mononitrate (IMDUR) 30 MG 24 hr tablet Take 1 tablet by mouth every day 02/09/22   Jonelle Sidle, MD  metoprolol succinate (TOPROL-XL) 100 MG 24 hr tablet Take 1 tablet by mouth every day after a meal 02/09/22   Jonelle Sidle, MD  Multiple  Vitamin (MULTIVITAMIN WITH MINERALS) TABS Take 1 tablet by mouth daily.    [provider]  nitroGLYCERIN (NITROSTAT) 0.4 MG SL tablet Place 1 tablet (0.4 mg total) under the tongue every 5 (five) minutes as needed. For chest pain 08/06/16   Jonelle Sidle, MD  omeprazole (PRILOSEC) 20 MG capsule Take 20 mg by mouth every morning.    [provider]  pravastatin (PRAVACHOL) 40 MG tablet Take 40 mg by mouth at bedtime.    [provider]  spironolactone (ALDACTONE) 25 MG tablet Take one-half tablet by mouth every day 08/24/22   Jonelle Sidle, MD  tamsulosin (FLOMAX) 0.4 MG CAPS capsule Take 1 capsule (0.4 mg total) by mouth daily. 02/25/16   Beaulah Dinning, MD  torsemide (DEMADEX) 20 MG tablet Take 20 mg by mouth daily.    [provider]      Allergies    Codeine and Tape    Review of Systems   Review of Systems  Constitutional:  Negative for appetite change and fatigue.  HENT:  Negative for congestion, ear discharge and sinus pressure.        Pain on the right side of her face  Eyes:  Negative for discharge.  Respiratory:  Negative for cough.   Cardiovascular:  Negative for chest pain.  Gastrointestinal:  Negative for abdominal pain and diarrhea.  Genitourinary:  Negative for  frequency and hematuria.  Musculoskeletal:  Negative for back pain.  Skin:  Negative for rash.  Neurological:  Negative for seizures and headaches.  Psychiatric/Behavioral:  Negative for hallucinations.     Physical Exam Updated Vital Signs BP 122/63 (BP Location: Right Arm)   Pulse 97   Temp 98.9 F (37.2 C) (Oral)   Resp 15   Ht 5\' 3"  (1.6 m)   Wt 91.6 kg   SpO2 99%   BMI 35.78 kg/m  Physical Exam Vitals reviewed.  Constitutional:      Appearance: She is well-developed.  HENT:     Head: Normocephalic.     Comments: Tenderness to right lower jaw consistent with dental caries    Nose: Nose normal.  Eyes:     General: No scleral icterus.     Conjunctiva/sclera: Conjunctivae normal.  Neck:     Thyroid: No thyromegaly.  Cardiovascular:     Rate and Rhythm: Normal rate and regular rhythm.     Heart sounds: No murmur heard.    No friction rub. No gallop.  Pulmonary:     Breath sounds: No stridor. No wheezing or rales.  Chest:     Chest wall: No tenderness.  Abdominal:     General: There is no distension.     Tenderness: There is no abdominal tenderness. There is no rebound.  Musculoskeletal:        General: Normal range of motion.     Cervical back: Neck supple.  Lymphadenopathy:     Cervical: No cervical adenopathy.  Skin:    Findings: No erythema or rash.  Neurological:     Mental Status: She is alert and oriented to person, place, and time.     Motor: No abnormal muscle tone.     Coordination: Coordination normal.  Psychiatric:        Behavior: Behavior normal.     ED Results / Procedures / Treatments   Labs (all labs ordered are listed, but only abnormal results are displayed) Labs Reviewed - No data to display  EKG None  Radiology No results found.  Procedures Procedures    Medications Ordered in ED Medications  HYDROcodone-acetaminophen (NORCO/VICODIN) 5-325 MG per tablet 1 tablet (has no administration in time range)  penicillin v potassium (VEETID) tablet 500 mg (has no administration in time range)    ED Course/ Medical Decision Making/ A&P                             Medical Decision Making Risk Prescription drug management.   Patient with a dental abscess.  She is given Vicodin and penicillin will follow-up with his dentist        Final Clinical Impression(s) / ED Diagnoses Final diagnoses:  None    Rx / DC Orders ED Discharge Orders          Ordered    HYDROcodone-acetaminophen (NORCO/VICODIN) 5-325 MG tablet  Every 6 hours PRN        09/30/22 2002    penicillin v potassium (VEETID) 500 MG tablet  4 times daily        09/30/22 2002              Zadie Deemer,  Issis Lindseth, MD 10/02/22 1252

## 2022-09-30 NOTE — Discharge Instructions (Signed)
Follow-up with your dentist next week

## 2022-09-30 NOTE — ED Triage Notes (Signed)
Pt c/o right lower mouth swelling since this morning r/t dental abscess. Pain rated 10/10

## 2022-10-12 ENCOUNTER — Emergency Department (HOSPITAL_COMMUNITY): Payer: 59

## 2022-10-12 ENCOUNTER — Other Ambulatory Visit: Payer: Self-pay

## 2022-10-12 ENCOUNTER — Inpatient Hospital Stay (HOSPITAL_COMMUNITY)
Admission: EM | Admit: 2022-10-12 | Discharge: 2022-10-15 | DRG: 683 | Disposition: A | Discharge status: Home / Self Care | Payer: 59 | Attending: Internal Medicine | Admitting: Internal Medicine

## 2022-10-12 DIAGNOSIS — E669 Obesity, unspecified: Secondary | ICD-10-CM | POA: Diagnosis present

## 2022-10-12 DIAGNOSIS — K219 Gastro-esophageal reflux disease without esophagitis: Secondary | ICD-10-CM | POA: Diagnosis present

## 2022-10-12 DIAGNOSIS — Z6834 Body mass index (BMI) 34.0-34.9, adult: Secondary | ICD-10-CM

## 2022-10-12 DIAGNOSIS — E039 Hypothyroidism, unspecified: Secondary | ICD-10-CM | POA: Diagnosis not present

## 2022-10-12 DIAGNOSIS — I429 Cardiomyopathy, unspecified: Secondary | ICD-10-CM | POA: Diagnosis not present

## 2022-10-12 DIAGNOSIS — Z0001 Encounter for general adult medical examination with abnormal findings: Secondary | ICD-10-CM | POA: Diagnosis not present

## 2022-10-12 DIAGNOSIS — I5022 Chronic systolic (congestive) heart failure: Secondary | ICD-10-CM | POA: Diagnosis not present

## 2022-10-12 DIAGNOSIS — I2510 Atherosclerotic heart disease of native coronary artery without angina pectoris: Secondary | ICD-10-CM | POA: Diagnosis present

## 2022-10-12 DIAGNOSIS — E1140 Type 2 diabetes mellitus with diabetic neuropathy, unspecified: Secondary | ICD-10-CM | POA: Diagnosis present

## 2022-10-12 DIAGNOSIS — Z885 Allergy status to narcotic agent status: Secondary | ICD-10-CM

## 2022-10-12 DIAGNOSIS — F5104 Psychophysiologic insomnia: Secondary | ICD-10-CM | POA: Diagnosis present

## 2022-10-12 DIAGNOSIS — E860 Dehydration: Secondary | ICD-10-CM | POA: Diagnosis present

## 2022-10-12 DIAGNOSIS — I10 Essential (primary) hypertension: Secondary | ICD-10-CM | POA: Diagnosis not present

## 2022-10-12 DIAGNOSIS — I110 Hypertensive heart disease with heart failure: Secondary | ICD-10-CM | POA: Diagnosis present

## 2022-10-12 DIAGNOSIS — N179 Acute kidney failure, unspecified: Principal | ICD-10-CM | POA: Diagnosis present

## 2022-10-12 DIAGNOSIS — Z7901 Long term (current) use of anticoagulants: Secondary | ICD-10-CM | POA: Diagnosis not present

## 2022-10-12 DIAGNOSIS — T502X5A Adverse effect of carbonic-anhydrase inhibitors, benzothiadiazides and other diuretics, initial encounter: Secondary | ICD-10-CM | POA: Diagnosis present

## 2022-10-12 DIAGNOSIS — Z91048 Other nonmedicinal substance allergy status: Secondary | ICD-10-CM | POA: Diagnosis not present

## 2022-10-12 DIAGNOSIS — D71 Functional disorders of polymorphonuclear neutrophils: Secondary | ICD-10-CM | POA: Diagnosis not present

## 2022-10-12 DIAGNOSIS — I700 Atherosclerosis of aorta: Secondary | ICD-10-CM | POA: Diagnosis not present

## 2022-10-12 DIAGNOSIS — I4821 Permanent atrial fibrillation: Secondary | ICD-10-CM | POA: Diagnosis present

## 2022-10-12 DIAGNOSIS — Z833 Family history of diabetes mellitus: Secondary | ICD-10-CM | POA: Diagnosis not present

## 2022-10-12 DIAGNOSIS — I959 Hypotension, unspecified: Secondary | ICD-10-CM

## 2022-10-12 DIAGNOSIS — Z8249 Family history of ischemic heart disease and other diseases of the circulatory system: Secondary | ICD-10-CM | POA: Diagnosis not present

## 2022-10-12 DIAGNOSIS — E785 Hyperlipidemia, unspecified: Secondary | ICD-10-CM | POA: Diagnosis present

## 2022-10-12 DIAGNOSIS — I951 Orthostatic hypotension: Secondary | ICD-10-CM | POA: Diagnosis present

## 2022-10-12 DIAGNOSIS — Z96642 Presence of left artificial hip joint: Secondary | ICD-10-CM | POA: Diagnosis present

## 2022-10-12 DIAGNOSIS — R531 Weakness: Secondary | ICD-10-CM | POA: Diagnosis not present

## 2022-10-12 DIAGNOSIS — I480 Paroxysmal atrial fibrillation: Secondary | ICD-10-CM | POA: Diagnosis not present

## 2022-10-12 DIAGNOSIS — I340 Nonrheumatic mitral (valve) insufficiency: Secondary | ICD-10-CM | POA: Diagnosis present

## 2022-10-12 DIAGNOSIS — I5032 Chronic diastolic (congestive) heart failure: Secondary | ICD-10-CM | POA: Diagnosis not present

## 2022-10-12 DIAGNOSIS — N1831 Chronic kidney disease, stage 3a: Secondary | ICD-10-CM | POA: Diagnosis not present

## 2022-10-12 DIAGNOSIS — Z79899 Other long term (current) drug therapy: Secondary | ICD-10-CM

## 2022-10-12 DIAGNOSIS — I5023 Acute on chronic systolic (congestive) heart failure: Secondary | ICD-10-CM | POA: Diagnosis not present

## 2022-10-12 LAB — CBC WITH DIFFERENTIAL/PLATELET
Abs Immature Granulocytes: 0.1 10*3/uL — ABNORMAL HIGH (ref 0.00–0.07)
Basophils Absolute: 0.1 10*3/uL (ref 0.0–0.1)
Basophils Relative: 1 %
Eosinophils Absolute: 0.1 10*3/uL (ref 0.0–0.5)
Eosinophils Relative: 1 %
HCT: 45 % (ref 36.0–46.0)
Hemoglobin: 14.9 g/dL (ref 12.0–15.0)
Immature Granulocytes: 2 %
Lymphocytes Relative: 28 %
Lymphs Abs: 1.7 10*3/uL (ref 0.7–4.0)
MCH: 32 pg (ref 26.0–34.0)
MCHC: 33.1 g/dL (ref 30.0–36.0)
MCV: 96.6 fL (ref 80.0–100.0)
Monocytes Absolute: 0.4 10*3/uL (ref 0.1–1.0)
Monocytes Relative: 7 %
Neutro Abs: 3.9 10*3/uL (ref 1.7–7.7)
Neutrophils Relative %: 61 %
Platelets: 244 10*3/uL (ref 150–400)
RBC: 4.66 MIL/uL (ref 3.87–5.11)
RDW: 12.5 % (ref 11.5–15.5)
WBC: 6.3 10*3/uL (ref 4.0–10.5)
nRBC: 0 % (ref 0.0–0.2)

## 2022-10-12 LAB — COMPREHENSIVE METABOLIC PANEL
ALT: 15 U/L (ref 0–44)
AST: 21 U/L (ref 15–41)
Albumin: 3.6 g/dL (ref 3.5–5.0)
Alkaline Phosphatase: 63 U/L (ref 38–126)
Anion gap: 10 (ref 5–15)
BUN: 39 mg/dL — ABNORMAL HIGH (ref 8–23)
CO2: 24 mmol/L (ref 22–32)
Calcium: 10.5 mg/dL — ABNORMAL HIGH (ref 8.9–10.3)
Chloride: 100 mmol/L (ref 98–111)
Creatinine, Ser: 2.27 mg/dL — ABNORMAL HIGH (ref 0.44–1.00)
GFR, Estimated: 21 mL/min — ABNORMAL LOW (ref >60)
Glucose, Bld: 95 mg/dL (ref 70–99)
Potassium: 4.8 mmol/L (ref 3.5–5.1)
Sodium: 134 mmol/L — ABNORMAL LOW (ref 135–145)
Total Bilirubin: 0.7 mg/dL (ref 0.3–1.2)
Total Protein: 7.2 g/dL (ref 6.5–8.1)

## 2022-10-12 LAB — LACTIC ACID, PLASMA: Lactic Acid, Venous: 1.6 mmol/L (ref 0.5–1.9)

## 2022-10-12 LAB — TSH: TSH: 0.996 u[IU]/mL (ref 0.350–4.500)

## 2022-10-12 LAB — HEMOGLOBIN A1C
Hgb A1c MFr Bld: 5.9 % — ABNORMAL HIGH (ref 4.8–5.6)
Mean Plasma Glucose: 122.63 mg/dL

## 2022-10-12 LAB — CBG MONITORING, ED: Glucose-Capillary: 102 mg/dL — ABNORMAL HIGH (ref 70–99)

## 2022-10-12 MED ORDER — APIXABAN 2.5 MG PO TABS
2.5000 mg | ORAL_TABLET | Freq: Two times a day (BID) | ORAL | Status: DC
  Administered 2022-10-12 – 2022-10-15 (×6): 2.5 mg via ORAL
  Filled 2022-10-12 (×6): qty 1

## 2022-10-12 MED ORDER — APIXABAN 5 MG PO TABS: 5.0000 mg | ORAL_TABLET | Freq: Two times a day (BID) | ORAL | Status: DC

## 2022-10-12 MED ORDER — TAMSULOSIN HCL 0.4 MG PO CAPS
0.4000 mg | ORAL_CAPSULE | Freq: Every day | ORAL | Status: DC
  Administered 2022-10-12 – 2022-10-13 (×2): 0.4 mg via ORAL
  Filled 2022-10-12 (×2): qty 1

## 2022-10-12 MED ORDER — HYDROCODONE-ACETAMINOPHEN 5-325 MG PO TABS: 1.0000 | ORAL_TABLET | Freq: Four times a day (QID) | ORAL | Status: DC | PRN

## 2022-10-12 MED ORDER — SODIUM CHLORIDE 0.9 % IV BOLUS
500.0000 mL | Freq: Once | INTRAVENOUS | Status: AC
  Administered 2022-10-12: 500 mL via INTRAVENOUS

## 2022-10-12 MED ORDER — PRAVASTATIN SODIUM 40 MG PO TABS
40.0000 mg | ORAL_TABLET | Freq: Every day | ORAL | Status: DC
  Administered 2022-10-12 – 2022-10-14 (×3): 40 mg via ORAL
  Filled 2022-10-12 (×3): qty 1

## 2022-10-12 MED ORDER — PANTOPRAZOLE SODIUM 40 MG PO TBEC
40.0000 mg | DELAYED_RELEASE_TABLET | Freq: Every day | ORAL | Status: DC
  Administered 2022-10-12 – 2022-10-15 (×4): 40 mg via ORAL
  Filled 2022-10-12 (×4): qty 1

## 2022-10-12 MED ORDER — GABAPENTIN 100 MG PO CAPS
100.0000 mg | ORAL_CAPSULE | Freq: Every day | ORAL | Status: DC
  Administered 2022-10-12 – 2022-10-14 (×3): 100 mg via ORAL
  Filled 2022-10-12 (×3): qty 1

## 2022-10-12 MED ORDER — SODIUM CHLORIDE 0.9% FLUSH
3.0000 mL | Freq: Two times a day (BID) | INTRAVENOUS | Status: DC
  Administered 2022-10-12 – 2022-10-15 (×5): 3 mL via INTRAVENOUS

## 2022-10-12 MED ORDER — SODIUM CHLORIDE 0.9 % IV SOLN: 250.0000 mL | INTRAVENOUS | Status: DC | PRN

## 2022-10-12 MED ORDER — ACETAMINOPHEN 325 MG PO TABS: 650.0000 mg | ORAL_TABLET | ORAL | Status: DC | PRN

## 2022-10-12 MED ORDER — ONDANSETRON HCL 4 MG/2ML IJ SOLN: 4.0000 mg | Freq: Four times a day (QID) | INTRAMUSCULAR | Status: DC | PRN

## 2022-10-12 MED ORDER — SODIUM CHLORIDE 0.9% FLUSH: 3.0000 mL | INTRAVENOUS | Status: DC | PRN

## 2022-10-12 MED ORDER — SODIUM CHLORIDE 0.9 % IV SOLN: INTRAVENOUS | Status: AC

## 2022-10-12 MED ORDER — INSULIN ASPART 100 UNIT/ML IJ SOLN
0.0000 [IU] | Freq: Three times a day (TID) | INTRAMUSCULAR | Status: DC
  Administered 2022-10-15: 1 [IU] via SUBCUTANEOUS

## 2022-10-12 MED ORDER — METOPROLOL TARTRATE 25 MG PO TABS
25.0000 mg | ORAL_TABLET | Freq: Two times a day (BID) | ORAL | Status: DC
  Administered 2022-10-12 – 2022-10-13 (×3): 25 mg via ORAL
  Filled 2022-10-12 (×4): qty 1

## 2022-10-12 NOTE — ED Triage Notes (Signed)
Patient presents to ed via [facility]  states she went to her PCP for not feeling well  for several days c/o increased stress at home , c/o decreased  appetite . States she last ate good on Sat. Patient is alert oriented.

## 2022-10-12 NOTE — ED Notes (Signed)
ED TO INPATIENT HANDOFF REPORT  ED Nurse Name and Phone #: [phone]  S Name/Age/Gender [name] 82 y.o. female Room/Bed: 012C/012C  Code Status   Code Status: Full Code  Home/SNF/Other Home Patient oriented to: self, place, time, and situation Is this baseline? Yes   Triage Complete: Triage complete  Chief Complaint AKI (acute kidney injury) (HCC) [N17.9]  Triage Note Patient presents to ed via [facility]  states she went to her PCP for not feeling well  for several days c/o increased stress at home , c/o decreased  appetite . States she last ate good on Sat. Patient is alert oriented.    Allergies Allergies  Allergen Reactions   Codeine Itching    "feel like going buggy"  Itchy skin   Tape Itching    Nylon tape makes pt feel itchy    Level of Care/Admitting Diagnosis ED Disposition     ED Disposition  Admit   Condition  --   Comment  Hospital Area: [facility] [[id number]]  Level of Care: Telemetry Medical [104]  May place patient in observation at [facility] or [facility] if equivalent level of care is available:: No  Covid Evaluation: Asymptomatic - no recent exposure (last 10 days) testing not required  Diagnosis: AKI (acute kidney injury) [facility]) [161096]  Admitting Physician: Emeline General [[id number]]  Attending Physician: Emeline General [[id number]]          B Medical/Surgery History Past Medical History:  Diagnosis Date   Anemia    Cardiomyopathy (HCC)    CHF (congestive heart failure) (HCC)    a. EF 40-45% in 2018 b. 35% in 11/2019 and 07/2020 c. EF at 40-45% by echo in 09/2021   Chronic insomnia    Coronary atherosclerosis of native coronary artery    60% LAD in 2002   Diverticulitis 2015   DJD (degenerative joint disease)    Left THA   Essential hypertension    GERD (gastroesophageal reflux disease)    Hyperlipidemia    Hypothyroidism    Left bundle branch block    Nephrolithiasis    Obesity    Paroxysmal atrial  flutter (HCC)    Documented January 2018   Past Surgical History:  Procedure Laterality Date   ABDOMINAL HYSTERECTOMY     (partial)   APPENDECTOMY     CHOLECYSTECTOMY     COLONOSCOPY  Aug 2013   Dr. Loreta Ave, [facility]: small internal hemorrhoids and few scattered sigmoid diverticula, otherwise normal up to TI, TI appeared normal   ESOPHAGOGASTRODUODENOSCOPY N/A 06/01/2012   RMR:5 cm hiatal hernia. Essentially normal esophagus   HEMORROIDECTOMY     JOINT REPLACEMENT     NOSE SURGERY     REVISION TOTAL HIP ARTHROPLASTY  2009   Left THA 2009     A IV Location/Drains/Wounds Patient Lines/Drains/Airways Status     Active Line/Drains/Airways     Name Placement date Placement time Site Days   Peripheral IV 10/12/22 22 G Left;Posterior Hand 10/12/22  1100  Hand  less than 1            Intake/Output Last 24 hours  Intake/Output Summary (Last 24 hours) at 10/12/2022 1643 Last data filed at 10/12/2022 1344 Gross per 24 hour  Intake 500 ml  Output --  Net 500 ml    Labs/Imaging Results for orders placed or performed during the hospital encounter of 10/12/22 (from the past 48 hour(s))  Comprehensive metabolic panel  Status: Abnormal   Collection Time: 10/12/22 10:27 AM  Result Value Ref Range   Sodium 134 (L) 135 - 145 mmol/L   Potassium 4.8 3.5 - 5.1 mmol/L   Chloride 100 98 - 111 mmol/L   CO2 24 22 - 32 mmol/L   Glucose, Bld 95 70 - 99 mg/dL    Comment: Glucose reference range applies only to samples taken after fasting for at least 8 hours.   BUN 39 (H) 8 - 23 mg/dL   Creatinine, Ser 1.61 (H) 0.44 - 1.00 mg/dL   Calcium 09.6 (H) 8.9 - 10.3 mg/dL   Total Protein 7.2 6.5 - 8.1 g/dL   Albumin 3.6 3.5 - 5.0 g/dL   AST 21 15 - 41 U/L   ALT 15 0 - 44 U/L   Alkaline Phosphatase 63 38 - 126 U/L   Total Bilirubin 0.7 0.3 - 1.2 mg/dL   GFR, Estimated 21 (L) >60 mL/min    Comment: (NOTE) Calculated using the CKD-EPI Creatinine Equation (2021)    Anion gap 10  5 - 15    Comment: Performed at [facility], [address] [place], [place] [address]  Lactic acid, plasma     Status: None   Collection Time: 10/12/22  2:00 PM  Result Value Ref Range   Lactic Acid, Venous 1.6 0.5 - 1.9 mmol/L    Comment: Performed at [facility], [address], [place], [place] [address]  CBC with Differential/Platelet     Status: Abnormal   Collection Time: 10/12/22  2:16 PM  Result Value Ref Range   WBC 6.3 4.0 - 10.5 K/uL   RBC 4.66 3.87 - 5.11 MIL/uL   Hemoglobin 14.9 12.0 - 15.0 g/dL   HCT 14.7 82.9 - 56.2 %   MCV 96.6 80.0 - 100.0 fL   MCH 32.0 26.0 - 34.0 pg   MCHC 33.1 30.0 - 36.0 g/dL   RDW 13.0 86.5 - 78.4 %   Platelets 244 150 - 400 K/uL   nRBC 0.0 0.0 - 0.2 %   Neutrophils Relative % 61 %   Neutro Abs 3.9 1.7 - 7.7 K/uL   Lymphocytes Relative 28 %   Lymphs Abs 1.7 0.7 - 4.0 K/uL   Monocytes Relative 7 %   Monocytes Absolute 0.4 0.1 - 1.0 K/uL   Eosinophils Relative 1 %   Eosinophils Absolute 0.1 0.0 - 0.5 K/uL   Basophils Relative 1 %   Basophils Absolute 0.1 0.0 - 0.1 K/uL   Immature Granulocytes 2 %   Abs Immature Granulocytes 0.10 (H) 0.00 - 0.07 K/uL    Comment: Performed at [facility], [address], [place], [place] [address]  TSH     Status: None   Collection Time: 10/12/22  2:22 PM  Result Value Ref Range   TSH 0.996 0.350 - 4.500 uIU/mL    Comment: Performed by a 3rd Generation assay with a functional sensitivity of <=0.01 uIU/mL. Performed at [facility], [address], [place], [place] [address]    DG Chest Portable 1 View  Result Date: 10/12/2022 CLINICAL DATA:  Provided history: Weakness. EXAM: PORTABLE CHEST 1 VIEW COMPARISON:  Prior chest radiograph 07/03/2019 and earlier. FINDINGS: Heart size at the upper limits of normal. Aortic atherosclerosis. No appreciable airspace consolidation. Redemonstrated chronic calcified granuloma within the left lung apex, and calcified mediastinal/hilar lymph  nodes. No evidence of pleural effusion or pneumothorax. No acute osseous abnormality identified. Degenerative changes of the  spine. IMPRESSION: 1. No evidence of an acute cardiopulmonary abnormality. 2. Stable chronic sequelae of granulomatous disease. 3. Aortic Atherosclerosis (ICD10-I70.0). Electronically Signed   By: Jackey Loge D.O.   On: 10/12/2022 11:35    Pending Labs Unresulted Labs (From admission, onward)     Start     Ordered   10/13/22 0500  Basic metabolic panel  Daily,   R     Comments: As Scheduled for 5 days    10/12/22 1633   10/12/22 1632  Hemoglobin A1c  Once,   R       Comments: To assess prior glycemic control    10/12/22 1633   10/12/22 1027  CBC with Differential  Once,   STAT        10/12/22 1026   10/12/22 1027  Culture, blood (routine x 2)  BLOOD CULTURE X 2,   R      10/12/22 1026            Vitals/Pain Today's Vitals   10/12/22 1400 10/12/22 1503 10/12/22 1523 10/12/22 1630  BP:   (!) 91/53 (!) 101/54  Pulse: 67  62 92  Resp: 18  17 16   Temp:  97.6 F (36.4 C)    TempSrc:  Oral    SpO2: 100%  94% 96%  Weight:      Height:      PainSc:        Isolation Precautions No active isolations  Medications Medications  HYDROcodone-acetaminophen (NORCO/VICODIN) 5-325 MG per tablet 1 tablet (has no administration in time range)  metoprolol tartrate (LOPRESSOR) tablet 25 mg (has no administration in time range)  pravastatin (PRAVACHOL) tablet 40 mg (has no administration in time range)  pantoprazole (PROTONIX) EC tablet 40 mg (has no administration in time range)  tamsulosin (FLOMAX) capsule 0.4 mg (has no administration in time range)  gabapentin (NEURONTIN) capsule 100 mg (has no administration in time range)  sodium chloride flush (NS) 0.9 % injection 3 mL (has no administration in time range)  sodium chloride flush (NS) 0.9 % injection 3 mL (has no administration in time range)  0.9 %  sodium chloride infusion (has no administration in time  range)  acetaminophen (TYLENOL) tablet 650 mg (has no administration in time range)  ondansetron (ZOFRAN) injection 4 mg (has no administration in time range)  insulin aspart (novoLOG) injection 0-9 Units (has no administration in time range)  apixaban (ELIQUIS) tablet 2.5 mg (has no administration in time range)  sodium chloride 0.9 % bolus 500 mL (0 mLs Intravenous Stopped 10/12/22 1344)  sodium chloride 0.9 % bolus 500 mL (500 mLs Intravenous New Bag/Given 10/12/22 1510)    Mobility Walks with minimal assistance      R Recommendations: See Admitting Provider Note  Report given to:   Additional Notes: came in for hypotension from PCP, not feeling well for several days, received 1L NS, A&Ox4, walks with minimal assistance, been a little hypotensive

## 2022-10-12 NOTE — ED Provider Notes (Signed)
[place] EMERGENCY DEPARTMENT AT [facility] Provider Note   CSN: [id number] Arrival date & time: 10/12/22  1000     History  Chief Complaint  Patient presents with   Hypotension    [name] is a 82 y.o. female.  HPI Patient presents with hypotension sent from PCPs office.  Reportedly hypotensive there.  Has not felt well at home over the last few days.  Has a history of CHF.  Reviewing notes weight is down 11 pounds since previous PCP visit and down 4 pounds since most recent cardiology visit which was about 3 weeks ago.  Also had been treated for dental abscess.  Denies fevers or chills.  Not currently on antibiotics.   Past Medical History:  Diagnosis Date   Anemia    Cardiomyopathy (HCC)    CHF (congestive heart failure) (HCC)    a. EF 40-45% in 2018 b. 35% in 11/2019 and 07/2020 c. EF at 40-45% by echo in 09/2021   Chronic insomnia    Coronary atherosclerosis of native coronary artery    60% LAD in 2002   Diverticulitis 2015   DJD (degenerative joint disease)    Left THA   Essential hypertension    GERD (gastroesophageal reflux disease)    Hyperlipidemia    Hypothyroidism    Left bundle branch block    Nephrolithiasis    Obesity    Paroxysmal atrial flutter (HCC)    Documented January 2018    Home Medications Prior to Admission medications   Medication Sig Start Date End Date Taking? Authorizing Provider  apixaban (ELIQUIS) 5 MG TABS tablet Take 1 tablet by mouth twice daily 07/06/22   Jonelle Sidle, MD  Calcium Carbonate-Vitamin D (CALCIUM 600 + D PO) Take 1 tablet by mouth daily.     [provider]  ENTRESTO 49-51 MG Take 1 tablet by mouth twice daily 08/10/22   Jonelle Sidle, MD  FARXIGA 10 MG TABS tablet Take 1 tablet by mouth every day 01/12/22   Jonelle Sidle, MD  gabapentin (NEURONTIN) 100 MG capsule Take 100 mg by mouth at bedtime.    [provider]  HYDROcodone-acetaminophen (NORCO/VICODIN) 5-325 MG  tablet Take 1 tablet by mouth every 6 (six) hours as needed for moderate pain. 09/30/22   Bethann Berkshire, MD  isosorbide mononitrate (IMDUR) 30 MG 24 hr tablet Take 1 tablet by mouth every day 02/09/22   Jonelle Sidle, MD  metoprolol succinate (TOPROL-XL) 100 MG 24 hr tablet Take 1 tablet by mouth every day after a meal 02/09/22   Jonelle Sidle, MD  Multiple Vitamin (MULTIVITAMIN WITH MINERALS) TABS Take 1 tablet by mouth daily.    [provider]  nitroGLYCERIN (NITROSTAT) 0.4 MG SL tablet Place 1 tablet (0.4 mg total) under the tongue every 5 (five) minutes as needed. For chest pain 08/06/16   Jonelle Sidle, MD  omeprazole (PRILOSEC) 20 MG capsule Take 20 mg by mouth every morning.    [provider]  pravastatin (PRAVACHOL) 40 MG tablet Take 40 mg by mouth at bedtime.    [provider]  spironolactone (ALDACTONE) 25 MG tablet Take one-half tablet by mouth every day 08/24/22   Jonelle Sidle, MD  tamsulosin (FLOMAX) 0.4 MG CAPS capsule Take 1 capsule (0.4 mg total) by mouth daily. 02/25/16   Beaulah Dinning, MD  torsemide (DEMADEX) 20 MG tablet Take 20 mg by mouth daily.    [provider]  Allergies    Codeine and Tape    Review of Systems   Review of Systems  Physical Exam Updated Vital Signs BP (!) 91/53   Pulse 62   Temp 97.6 F (36.4 C) (Oral)   Resp 17   Ht 5\' 3"  (1.6 m)   Wt 90.7 kg   SpO2 94%   BMI 35.43 kg/m  Physical Exam Vitals reviewed.  HENT:     Head: Normocephalic.  Eyes:     Pupils: Pupils are equal, round, and reactive to light.  Cardiovascular:     Rate and Rhythm: Regular rhythm.  Pulmonary:     Breath sounds: No wheezing.  Abdominal:     Tenderness: There is no abdominal tenderness.  Musculoskeletal:     Right lower leg: No edema.     Left lower leg: No edema.  Skin:    Capillary Refill: Capillary refill takes less than 2 seconds.  Neurological:     Mental Status: She is oriented to  person, place, and time.     ED Results / Procedures / Treatments   Labs (all labs ordered are listed, but only abnormal results are displayed) Labs Reviewed  COMPREHENSIVE METABOLIC PANEL - Abnormal; Notable for the following components:      Result Value   Sodium 134 (*)    BUN 39 (*)    Creatinine, Ser 2.27 (*)    Calcium 10.5 (*)    GFR, Estimated 21 (*)    All other components within normal limits  CBC WITH DIFFERENTIAL/PLATELET - Abnormal; Notable for the following components:   Abs Immature Granulocytes 0.10 (*)    All other components within normal limits  CULTURE, BLOOD (ROUTINE X 2)  CULTURE, BLOOD (ROUTINE X 2)  LACTIC ACID, PLASMA  TSH  CBC WITH DIFFERENTIAL/PLATELET    EKG None  Radiology DG Chest Portable 1 View  Result Date: 10/12/2022 CLINICAL DATA:  Provided history: Weakness. EXAM: PORTABLE CHEST 1 VIEW COMPARISON:  Prior chest radiograph 07/03/2019 and earlier. FINDINGS: Heart size at the upper limits of normal. Aortic atherosclerosis. No appreciable airspace consolidation. Redemonstrated chronic calcified granuloma within the left lung apex, and calcified mediastinal/hilar lymph nodes. No evidence of pleural effusion or pneumothorax. No acute osseous abnormality identified. Degenerative changes of the spine. IMPRESSION: 1. No evidence of an acute cardiopulmonary abnormality. 2. Stable chronic sequelae of granulomatous disease. 3. Aortic Atherosclerosis (ICD10-I70.0). Electronically Signed   By: Jackey Loge D.O.   On: 10/12/2022 11:35    Procedures Procedures    Medications Ordered in ED Medications  sodium chloride 0.9 % bolus 500 mL (0 mLs Intravenous Stopped 10/12/22 1344)  sodium chloride 0.9 % bolus 500 mL (500 mLs Intravenous New Bag/Given 10/12/22 1510)    ED Course/ Medical Decision Making/ A&P                             Medical Decision Making Amount and/or Complexity of Data Reviewed Labs: ordered. Radiology: ordered.   Patient sent in  from PCP.  Hypotension.  Differential diagnosis includes infection, dehydration, anemia.  Will get blood cultures due to recent infection.  Will get lactic acid.  Creatinine now increased up to greater than 2.  Baseline is between 1-1.6.  Having mostly significantly with the weight down has been over diuresed.  Will give fluid bolus.  After first 500 cc still had some mild hypotension.  Will give another 500 cc bolus.  Patient would rather not  be admitted.  If labs reassuring patient feel better hopefully could follow-up with short-term follow-up with cardiology.  Patient has had fluid bolus and continued hypotension.  I think with the acute kidney injury she would benefit from admission for further adjustment of her fluid status.  Of note she is also due for repeat echocardiogram that has been scheduled prior to this episode. Will discuss with hospitalist for admission.        Final Clinical Impression(s) / ED Diagnoses Final diagnoses:  Hypotension, unspecified hypotension type  AKI (acute kidney injury) (HCC)  Dehydration    Rx / DC Orders ED Discharge Orders     None         Benjiman Core, MD 10/12/22 1530

## 2022-10-12 NOTE — H&P (Signed)
History and Physical    [name]:[id number] DOB: 1940-06-26 DOA: 10/12/2022  PCP: [name], MD (Confirm with patient/family/NH records and if not entered, this has to be entered at [facility] point of entry) Patient coming from: Home  I have personally briefly reviewed patient's old medical records in [facility]  Chief Complaint: Feeling weak, light headed  HPI: [name] is a 82 y.o. female with medical history significant of chronic HFrEF with LVEF 30-35%, cardiomyopathy, PAF on Eliquis, HTN, LBBB, IIDM,  DM neuropathy, presented with worsening of generalized weakness and lightheadedness.  Patient torsemide dosage was adjusted recently increased dosage about 2 weeks ago to address worsening of peripheral edema.  Patient lost 7-8 pound in last 2 weeks.  4.1-week, she has been feeling very tired and lightheadedness but denies any fall, no chest pain or shortness of breath.  Today she went to see her PCP on routine visit who told her to come to the ED for low blood pressure.  She denies any cough, no dysuria and no diarrhea. ED Course: Blood pressure 91/53, heart rate 75, nonhypoxic.  Blood work showed AKI creatinine 2.2 compared to baseline 0.9 about 1 year ago, WBC 6.3 K4.8.  X-ray showed no acute infiltrates.  TSH 0.9 lactic acid 1.6.  Patient was given IV bolus of 500 mL x 2  Review of Systems: As per HPI otherwise 14 point review of systems negative.    Past Medical History:  Diagnosis Date   Anemia    Cardiomyopathy (HCC)    CHF (congestive heart failure) (HCC)    a. EF 40-45% in 2018 b. 35% in 11/2019 and 07/2020 c. EF at 40-45% by echo in 09/2021   Chronic insomnia    Coronary atherosclerosis of native coronary artery    60% LAD in 2002   Diverticulitis 2015   DJD (degenerative joint disease)    Left THA   Essential hypertension    GERD (gastroesophageal reflux disease)    Hyperlipidemia    Hypothyroidism    Left bundle branch block     Nephrolithiasis    Obesity    Paroxysmal atrial flutter (HCC)    Documented January 2018    Past Surgical History:  Procedure Laterality Date   ABDOMINAL HYSTERECTOMY     (partial)   APPENDECTOMY     CHOLECYSTECTOMY     COLONOSCOPY  Aug 2013   Dr. Loreta Ave, [facility]: small internal hemorrhoids and few scattered sigmoid diverticula, otherwise normal up to TI, TI appeared normal   ESOPHAGOGASTRODUODENOSCOPY N/A 06/01/2012   RMR:5 cm hiatal hernia. Essentially normal esophagus   HEMORROIDECTOMY     JOINT REPLACEMENT     NOSE SURGERY     REVISION TOTAL HIP ARTHROPLASTY  2009   Left THA 2009     reports that she has never smoked. She has never used smokeless tobacco. She reports that she does not drink alcohol and does not use drugs.  Allergies  Allergen Reactions   Codeine Itching    "feel like going buggy"  Itchy skin   Tape Itching    Nylon tape makes pt feel itchy    Family History  Problem Relation Age of Onset   Heart attack Mother    Heart attack Father    Diabetes Brother    Colon cancer Neg Hx    Colon polyps Neg Hx      Prior to Admission medications   Medication Sig Start Date End Date Taking? Authorizing Provider  apixaban (ELIQUIS) 5 MG TABS tablet Take 1 tablet by mouth twice daily Patient taking differently: Take 5 mg by mouth 2 (two) times daily. 07/06/22  Yes Jonelle Sidle, MD  ENTRESTO 49-51 MG Take 1 tablet by mouth twice daily 08/10/22   Jonelle Sidle, MD  FARXIGA 10 MG TABS tablet Take 1 tablet by mouth every day 01/12/22   Jonelle Sidle, MD  gabapentin (NEURONTIN) 100 MG capsule Take 100 mg by mouth at bedtime.    [provider]  HYDROcodone-acetaminophen (NORCO/VICODIN) 5-325 MG tablet Take 1 tablet by mouth every 6 (six) hours as needed for moderate pain. 09/30/22   Bethann Berkshire, MD  isosorbide mononitrate (IMDUR) 30 MG 24 hr tablet Take 1 tablet by mouth every day 02/09/22   Jonelle Sidle, MD  metoprolol  succinate (TOPROL-XL) 100 MG 24 hr tablet Take 1 tablet by mouth every day after a meal 02/09/22   Jonelle Sidle, MD  Multiple Vitamin (MULTIVITAMIN WITH MINERALS) TABS Take 1 tablet by mouth daily.    [provider]  nitroGLYCERIN (NITROSTAT) 0.4 MG SL tablet Place 1 tablet (0.4 mg total) under the tongue every 5 (five) minutes as needed. For chest pain 08/06/16   Jonelle Sidle, MD  omeprazole (PRILOSEC) 20 MG capsule Take 20 mg by mouth every morning.    [provider]  pravastatin (PRAVACHOL) 40 MG tablet Take 40 mg by mouth at bedtime.    [provider]  spironolactone (ALDACTONE) 25 MG tablet Take one-half tablet by mouth every day 08/24/22   Jonelle Sidle, MD  tamsulosin (FLOMAX) 0.4 MG CAPS capsule Take 1 capsule (0.4 mg total) by mouth daily. 02/25/16   Beaulah Dinning, MD  torsemide (DEMADEX) 20 MG tablet Take 20 mg by mouth daily.    [provider]    Physical Exam: Vitals:   10/12/22 1400 10/12/22 1503 10/12/22 1523 10/12/22 1630  BP:   (!) 91/53 (!) 101/54  Pulse: 67  62 92  Resp: 18  17 16   Temp:  97.6 F (36.4 C)    TempSrc:  Oral    SpO2: 100%  94% 96%  Weight:      Height:        Constitutional: NAD, calm, comfortable Vitals:   10/12/22 1400 10/12/22 1503 10/12/22 1523 10/12/22 1630  BP:   (!) 91/53 (!) 101/54  Pulse: 67  62 92  Resp: 18  17 16   Temp:  97.6 F (36.4 C)    TempSrc:  Oral    SpO2: 100%  94% 96%  Weight:      Height:       Eyes: PERRL, lids and conjunctivae normal ENMT: Mucous membranes are dry. Posterior pharynx clear of any exudate or lesions.Normal dentition.  Neck: normal, supple, no masses, no thyromegaly Respiratory: clear to auscultation bilaterally, no wheezing, no crackles. Normal respiratory effort. No accessory muscle use.  Cardiovascular: Regular rate and rhythm, no murmurs / rubs / gallops. No extremity edema. 2+ pedal pulses. No carotid bruits.  Abdomen: no tenderness, no  masses palpated. No hepatosplenomegaly. Bowel sounds positive.  Musculoskeletal: no clubbing / cyanosis. No joint deformity upper and lower extremities. Good ROM, no contractures. Normal muscle tone.  Skin: no rashes, lesions, ulcers. No induration Neurologic: CN 2-12 grossly intact. Sensation intact, DTR normal. Strength 5/5 in all 4.  Psychiatric: Normal judgment and insight. Alert and oriented x 3. Normal mood.     Labs on Admission: I have personally  reviewed following labs and imaging studies  CBC: Recent Labs  Lab 10/12/22 1416  WBC 6.3  NEUTROABS 3.9  HGB 14.9  HCT 45.0  MCV 96.6  PLT 244   Basic Metabolic Panel: Recent Labs  Lab 10/12/22 1027  NA 134*  K 4.8  CL 100  CO2 24  GLUCOSE 95  BUN 39*  CREATININE 2.27*  CALCIUM 10.5*   GFR: Estimated Creatinine Clearance: 21.1 mL/min (A) (by C-G formula based on SCr of 2.27 mg/dL (H)). Liver Function Tests: Recent Labs  Lab 10/12/22 1027  AST 21  ALT 15  ALKPHOS 63  BILITOT 0.7  PROT 7.2  ALBUMIN 3.6   No results for input(s): "LIPASE", "AMYLASE" in the last 168 hours. No results for input(s): "AMMONIA" in the last 168 hours. Coagulation Profile: No results for input(s): "INR", "PROTIME" in the last 168 hours. Cardiac Enzymes: No results for input(s): "CKTOTAL", "CKMB", "CKMBINDEX", "TROPONINI" in the last 168 hours. BNP (last 3 results) No results for input(s): "PROBNP" in the last 8760 hours. HbA1C: No results for input(s): "HGBA1C" in the last 72 hours. CBG: No results for input(s): "GLUCAP" in the last 168 hours. Lipid Profile: No results for input(s): "CHOL", "HDL", "LDLCALC", "TRIG", "CHOLHDL", "LDLDIRECT" in the last 72 hours. Thyroid Function Tests: Recent Labs    10/12/22 1422  TSH 0.996   Anemia Panel: No results for input(s): "VITAMINB12", "FOLATE", "FERRITIN", "TIBC", "IRON", "RETICCTPCT" in the last 72 hours. Urine analysis:    Component Value Date/Time   COLORURINE YELLOW  02/25/2016 0810   APPEARANCEUR HAZY (A) 02/25/2016 0810   LABSPEC 1.020 02/25/2016 0810   PHURINE 6.0 02/25/2016 0810   GLUCOSEU NEGATIVE 02/25/2016 0810   HGBUR SMALL (A) 02/25/2016 0810   BILIRUBINUR NEGATIVE 02/25/2016 0810   KETONESUR NEGATIVE 02/25/2016 0810   PROTEINUR NEGATIVE 02/25/2016 0810   UROBILINOGEN 1.0 07/06/2013 1425   NITRITE NEGATIVE 02/25/2016 0810   LEUKOCYTESUR LARGE (A) 02/25/2016 0810    Radiological Exams on Admission: DG Chest Portable 1 View  Result Date: 10/12/2022 CLINICAL DATA:  Provided history: Weakness. EXAM: PORTABLE CHEST 1 VIEW COMPARISON:  Prior chest radiograph 07/03/2019 and earlier. FINDINGS: Heart size at the upper limits of normal. Aortic atherosclerosis. No appreciable airspace consolidation. Redemonstrated chronic calcified granuloma within the left lung apex, and calcified mediastinal/hilar lymph nodes. No evidence of pleural effusion or pneumothorax. No acute osseous abnormality identified. Degenerative changes of the spine. IMPRESSION: 1. No evidence of an acute cardiopulmonary abnormality. 2. Stable chronic sequelae of granulomatous disease. 3. Aortic Atherosclerosis (ICD10-I70.0). Electronically Signed   By: Jackey Loge D.O.   On: 10/12/2022 11:35    EKG: Independently reviewed. Sinus, chronic LBBB  Assessment/Plan Principal Problem:   AKI (acute kidney injury) (HCC)  (please populate well all problems here in Problem List. (For example, if patient is on BP meds at home and you resume or decide to hold them, it is a problem that needs to be her. Same for CAD, COPD, HLD and so on)  AKI Generalized weakness Near syncope Orthostatic hypotension -Prerenal, significant volume contraction dehydration secondary to overdiuresis -Hold off for CHF medication including torsemide, Entresto, spironolactone tonight -Continue gentle hydration normal saline at 75 MLS per hour for 12 hours, recheck volume status and renal function tomorrow to resume CHF  medication  Chronic HFrEF -Given the recent decompensation, suspect worsening of LVEF, will check echocardiogram to guide further diuresis and CHF medications adjustment  HTN -Hold off Imdur, Entresto, spironolactone  PAF -Renal adjusted Eliquis  IIDM -  Hold off Marcelline Deist given the renal function surge -SSI for now  DM neuropathy -Continue HS Gabapentin  Deconditioning -PT evaluation   DVT prophylaxis: Eliquis Code Status: Full code Family Communication: None at bedside Disposition Plan: Expect less than 2 midnight hospital stay Consults called: None Admission status: Tele obs   Emeline General MD [facility] Pager [phone] 10/12/2022, 4:54 PM

## 2022-10-12 NOTE — ED Notes (Signed)
I tried to get patient blood I did not have any success, the Nurse was informed.

## 2022-10-13 ENCOUNTER — Observation Stay (HOSPITAL_BASED_OUTPATIENT_CLINIC_OR_DEPARTMENT_OTHER): Payer: 59

## 2022-10-13 ENCOUNTER — Encounter (HOSPITAL_COMMUNITY): Payer: Self-pay | Admitting: Internal Medicine

## 2022-10-13 DIAGNOSIS — Z91048 Other nonmedicinal substance allergy status: Secondary | ICD-10-CM | POA: Diagnosis not present

## 2022-10-13 DIAGNOSIS — Z885 Allergy status to narcotic agent status: Secondary | ICD-10-CM | POA: Diagnosis not present

## 2022-10-13 DIAGNOSIS — Z96642 Presence of left artificial hip joint: Secondary | ICD-10-CM | POA: Diagnosis present

## 2022-10-13 DIAGNOSIS — I429 Cardiomyopathy, unspecified: Secondary | ICD-10-CM | POA: Diagnosis present

## 2022-10-13 DIAGNOSIS — I340 Nonrheumatic mitral (valve) insufficiency: Secondary | ICD-10-CM

## 2022-10-13 DIAGNOSIS — K219 Gastro-esophageal reflux disease without esophagitis: Secondary | ICD-10-CM | POA: Diagnosis present

## 2022-10-13 DIAGNOSIS — I4821 Permanent atrial fibrillation: Secondary | ICD-10-CM

## 2022-10-13 DIAGNOSIS — I951 Orthostatic hypotension: Secondary | ICD-10-CM | POA: Diagnosis present

## 2022-10-13 DIAGNOSIS — I5023 Acute on chronic systolic (congestive) heart failure: Secondary | ICD-10-CM | POA: Diagnosis not present

## 2022-10-13 DIAGNOSIS — E1140 Type 2 diabetes mellitus with diabetic neuropathy, unspecified: Secondary | ICD-10-CM | POA: Diagnosis present

## 2022-10-13 DIAGNOSIS — I2510 Atherosclerotic heart disease of native coronary artery without angina pectoris: Secondary | ICD-10-CM | POA: Diagnosis present

## 2022-10-13 DIAGNOSIS — T502X5A Adverse effect of carbonic-anhydrase inhibitors, benzothiadiazides and other diuretics, initial encounter: Secondary | ICD-10-CM | POA: Diagnosis present

## 2022-10-13 DIAGNOSIS — E039 Hypothyroidism, unspecified: Secondary | ICD-10-CM | POA: Diagnosis present

## 2022-10-13 DIAGNOSIS — I110 Hypertensive heart disease with heart failure: Secondary | ICD-10-CM | POA: Diagnosis present

## 2022-10-13 DIAGNOSIS — N179 Acute kidney failure, unspecified: Secondary | ICD-10-CM | POA: Diagnosis not present

## 2022-10-13 DIAGNOSIS — F5104 Psychophysiologic insomnia: Secondary | ICD-10-CM | POA: Diagnosis present

## 2022-10-13 DIAGNOSIS — Z833 Family history of diabetes mellitus: Secondary | ICD-10-CM | POA: Diagnosis not present

## 2022-10-13 DIAGNOSIS — E785 Hyperlipidemia, unspecified: Secondary | ICD-10-CM | POA: Diagnosis present

## 2022-10-13 DIAGNOSIS — E669 Obesity, unspecified: Secondary | ICD-10-CM | POA: Diagnosis present

## 2022-10-13 DIAGNOSIS — Z8249 Family history of ischemic heart disease and other diseases of the circulatory system: Secondary | ICD-10-CM | POA: Diagnosis not present

## 2022-10-13 DIAGNOSIS — E860 Dehydration: Secondary | ICD-10-CM | POA: Diagnosis present

## 2022-10-13 DIAGNOSIS — Z79899 Other long term (current) drug therapy: Secondary | ICD-10-CM | POA: Diagnosis not present

## 2022-10-13 DIAGNOSIS — Z7901 Long term (current) use of anticoagulants: Secondary | ICD-10-CM | POA: Diagnosis not present

## 2022-10-13 DIAGNOSIS — I5022 Chronic systolic (congestive) heart failure: Secondary | ICD-10-CM | POA: Diagnosis present

## 2022-10-13 DIAGNOSIS — Z6834 Body mass index (BMI) 34.0-34.9, adult: Secondary | ICD-10-CM | POA: Diagnosis not present

## 2022-10-13 LAB — ECHOCARDIOGRAM COMPLETE
Calc EF: 44 %
Est EF: 45
Height: 63 in
MV M vel: 4.8 m/s
MV Peak grad: 92 mmHg
Radius: 0.5 cm
S' Lateral: 3.2 cm
Single Plane A2C EF: 44.2 %
Single Plane A4C EF: 46.1 %
Weight: 3158.4 oz

## 2022-10-13 LAB — BASIC METABOLIC PANEL
Anion gap: 7 (ref 5–15)
BUN: 34 mg/dL — ABNORMAL HIGH (ref 8–23)
CO2: 24 mmol/L (ref 22–32)
Calcium: 9.4 mg/dL (ref 8.9–10.3)
Chloride: 106 mmol/L (ref 98–111)
Creatinine, Ser: 1.71 mg/dL — ABNORMAL HIGH (ref 0.44–1.00)
GFR, Estimated: 30 mL/min — ABNORMAL LOW (ref >60)
Glucose, Bld: 93 mg/dL (ref 70–99)
Potassium: 4.5 mmol/L (ref 3.5–5.1)
Sodium: 137 mmol/L (ref 135–145)

## 2022-10-13 LAB — GLUCOSE, CAPILLARY
Glucose-Capillary: 86 mg/dL (ref 70–99)
Glucose-Capillary: 88 mg/dL (ref 70–99)
Glucose-Capillary: 94 mg/dL (ref 70–99)
Glucose-Capillary: 97 mg/dL (ref 70–99)

## 2022-10-13 MED ORDER — SODIUM CHLORIDE 0.9 % IV BOLUS
500.0000 mL | Freq: Once | INTRAVENOUS | Status: AC
  Administered 2022-10-13: 500 mL via INTRAVENOUS

## 2022-10-13 NOTE — TOC Initial Note (Addendum)
Transition of Care [facility]) - Initial/Assessment Note    Patient Details  Name: [name] MRN: [id number] Date of Birth: February 14, 1941  [facility] CM/SW Contact:    Leone Haven, RN Phone Number: 10/13/2022, 11:47 AM  Clinical Narrative:                 From home alone with dog, Roscoe.  She has PCP, Dr. [name], she has insurance on file with medication coverage.  She currently does not have any HH services in place.  She has canes at home.  She states her neighbor or her grandchildren will transport her home at dc.  She states her neighbor and her grandchildren are her support system, she states she gets her medications from [facility] and also [facility] on [address] Pta she is self ambulatory per patient. Pert pt eval no pt f/u.        Patient Goals and CMS Choice            Expected Discharge Plan and Services                                              Prior Living Arrangements/Services                       Activities of Daily Living      Permission Sought/Granted                  Emotional Assessment              Admission diagnosis:  Dehydration [E86.0] AKI (acute kidney injury) (HCC) [N17.9] Hypotension, unspecified hypotension type [I95.9] Patient Active Problem List   Diagnosis Date Noted   AKI (acute kidney injury) (HCC) 10/12/2022   Colon cancer screening 04/19/2021   Acute on chronic diastolic CHF (congestive heart failure) (HCC) 07/05/2019   Atrial fibrillation with RVR (HCC) 07/03/2019   GERD (gastroesophageal reflux disease)    PSVT (paroxysmal supraventricular tachycardia) 04/20/2016   URI (upper respiratory infection) 04/20/2016   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 [place] [address] Phone: [phone] Fax: [phone]     Social Determinants of Health (SDOH) Social History: SDOH Screenings   Tobacco Use: Low Risk  (10/13/2022)   SDOH Interventions:     Readmission Risk Interventions     No data to display

## 2022-10-13 NOTE — Consult Note (Addendum)
Cardiology Consultation   Patient ID: GARI VEITH MRN: [id number]; DOB: 08/16/1940  Admit date: 10/12/2022 Date of Consult: 10/13/2022  PCP:  [name], MD   [facility] Providers Cardiologist:  [name], MD        Patient Profile:   [name] is a 82 y.o. female with a hx of HFrEF EF 40-45% in 2018, 35% in 11/2019 and 07/2020, at 40-45% by echo in 09/2021), mod MR, CAD (moderate disease by catheterization in 2002, NST in 2017 showing possible mild anterior septal and apical ischemia and medical management pursued), paroxysmal atrial fibrillation/flutter, LBBB, Stage 3 CKD, HTN, HLD and hypothyroidism who is being seen 10/13/2022 for the evaluation of  severe MR at the request of Dr. Jonathon Bellows.  History of Present Illness:   [name] with above hx with EF lowest at 35% in 2021 and 2022 now improved by Echo 09/2021 to 40-45% with mod MR on GDMT (farxiga, entresto, imdur, toprols XL spironolactone) also on diuretic torsemide 20 mg daily.   She had her last cardiac cath 2002 with non obstructive disease 30% pLAD, 60-70% mLAD stenosis at takeoff of 2nd diag branch  2nd diag 40% stenosis.  Nuc study 2017 was low risk.   She has permanent atrial fluttter/fib on Eliquis for CHA2DS2VASc of 6.   Her HTN has been controlled.  She had increased edema recently so torsemide increased for 2 weeks with wt loss.  On 09/30/22 was seen in ER for dental abscess and given Vicodin and PCN.   Pt admitted to [facility] yesterday after presenting to PCP with not feeling well and hypotensive. She has decreased appetite and wt down 4 pounds since Cardiology visit in early June.  She has been treated for dental abscess as well.  BP 105/71 on arrival and with sitting down to 85 systolic and standing 82 systolic.  Overall she has rec'd 3 NS boluses of 500 ml    Labs Na 134 K+ 4.8 Cr 2.27 BUN 39 LFTs WNL   today with fluids Cr 1.71    TSH 0.996 A1C 5.9  Lactic acid 1.6 WBC 6.3 Hgb 14.9 plts  244 EKG:  The EKG was personally reviewed and demonstrates:  atrial fib rate controlled at 74 LBBB and no acute changes from previous.  Telemetry:  Telemetry was personally reviewed and demonstrates:  atrial fib mostly rate controlled.  Occ to 50 and occ to 130s     Echo today with EF 45%, + RWMA interventricular septum is flattened in systole and diastole, consistent with right ventricular pressure and volume overload. Abnormal septal motion related to intraventricular conduction delay.   RV normal, mildly elevated Pulmonary artery systolic pressure. With estimated RV systolic pressure 36.7 LA sevrely dilated and RA mild to mod dilated. Small pericardial effusion is present.  Sever MR no MS --mitral valve is degenerative.  Mild to mod TR  No chest pain or SOB-  BP 112/ to 102,  still orthostatic with drop from 120/78 to 95/72 but improved from yesterday from 103/54 to 85/64    afebrile p 59 to 87   Past Medical History:  Diagnosis Date   Anemia    Cardiomyopathy (HCC)    CHF (congestive heart failure) (HCC)    a. EF 40-45% in 2018 b. 35% in 11/2019 and 07/2020 c. EF at 40-45% by echo in 09/2021   Chronic insomnia    Coronary atherosclerosis of native coronary artery    60% LAD in 2002  Diverticulitis 2015   DJD (degenerative joint disease)    Left THA   Essential hypertension    GERD (gastroesophageal reflux disease)    Hyperlipidemia    Hypothyroidism    Left bundle branch block    Nephrolithiasis    Obesity    Paroxysmal atrial flutter [facility])    Documented January 2018    Past Surgical History:  Procedure Laterality Date   ABDOMINAL HYSTERECTOMY     (partial)   APPENDECTOMY     CHOLECYSTECTOMY     COLONOSCOPY  Aug 2013   Dr. Loreta Ave, [facility]: small internal hemorrhoids and few scattered sigmoid diverticula, otherwise normal up to TI, TI appeared normal   ESOPHAGOGASTRODUODENOSCOPY N/A 06/01/2012   RMR:5 cm hiatal hernia. Essentially normal esophagus    HEMORROIDECTOMY     JOINT REPLACEMENT     NOSE SURGERY     REVISION TOTAL HIP ARTHROPLASTY  2009   Left THA 2009     HOME MEDS AS IN HPI  Inpatient Medications: Scheduled Meds:  apixaban  2.5 mg Oral BID   gabapentin  100 mg Oral QHS   insulin aspart  0-9 Units Subcutaneous TID WC   metoprolol tartrate  25 mg Oral BID   pantoprazole  40 mg Oral Daily   pravastatin  40 mg Oral QHS   sodium chloride flush  3 mL Intravenous Q12H   tamsulosin  0.4 mg Oral Daily   Continuous Infusions:  sodium chloride     PRN Meds: sodium chloride, acetaminophen, HYDROcodone-acetaminophen, ondansetron (ZOFRAN) IV, sodium chloride flush  Allergies:    Allergies  Allergen Reactions   Codeine Itching    "feel like going buggy"  Itchy skin   Tape Itching    Nylon tape makes pt feel itchy    Social History:   Social History   Socioeconomic History   Marital status: Widowed    Spouse name: Not on file   Number of children: Not on file   Years of education: Not on file   Highest education level: Not on file  Occupational History   Occupation: retired    Comment: used to work at [employer] in [employer]    Employer: RETIRED  Tobacco Use   Smoking status: Never   Smokeless tobacco: Never  Vaping Use   Vaping Use: Never used  Substance and Sexual Activity   Alcohol use: No    Alcohol/week: 0.0 standard drinks of alcohol   Drug use: No   Sexual activity: Not on file  Other Topics Concern   Not on file  Social History Narrative   Not on file   Social Determinants of Health   Financial Resource Strain: Not on file  Food Insecurity: Not on file  Transportation Needs: Not on file  Physical Activity: Not on file  Stress: Not on file  Social Connections: Not on file  Intimate Partner Violence: Not on file    Family History:    Family History  Problem Relation Age of Onset   Heart attack Mother    Heart attack Father    Diabetes Brother    Colon cancer Neg Hx    Colon polyps Neg  Hx      ROS:  Please see the history of present illness.  General:no colds or fevers, no weight changes, recent dental pain treated with PCN, + weakness Skin:no rashes or ulcers HEENT:no blurred vision, no congestion CV:see HPI PUL:see HPI GI:no diarrhea constipation or melena, no indigestion GU:no hematuria, no dysuria  MS:no joint pain, no claudication Neuro:no syncope, no lightheadedness Endo:+ diabetes, + thyroid disease  All other ROS reviewed and negative.     Physical Exam/Data:   Vitals:   10/12/22 1934 10/13/22 0021 10/13/22 0437 10/13/22 0807  BP: (!) 112/58 (!) 90/57 (!) 93/54 (!) 112/57  Pulse: 62 (!) 54 64 65  Resp: 18 18 18 18   Temp: 97.7 F (36.5 C) 97.8 F (36.6 C) 97.8 F (36.6 C)   TempSrc: Oral Oral Oral   SpO2: 95% 98% 97% 95%  Weight:   89.5 kg   Height:        Intake/Output Summary (Last 24 hours) at 10/13/2022 1104 Last data filed at 10/12/2022 1704 Gross per 24 hour  Intake 1000 ml  Output --  Net 1000 ml      10/13/2022    4:37 AM 10/12/2022   10:17 AM 09/30/2022    4:15 PM  Last 3 Weights  Weight (lbs) 197 lb 6.4 oz 200 lb 202 lb  Weight (kg) 89.54 kg 90.719 kg 91.627 kg     Body mass index is 34.97 kg/m.  General:  Well nourished, well developed, in no acute distress HEENT: normal Neck: no JVD Vascular: No carotid bruits; Distal pulses 2+ bilaterally Cardiac:  irreg irreg; no murmur + murmur Lungs:  clear to auscultation bilaterally, no wheezing, rhonchi or rales  Abd: soft, nontender, no hepatomegaly  Ext: no edema Musculoskeletal:  No deformities, BUE and BLE strength normal and equal Skin: warm and dry  Neuro:  alert and oriented X 3 MAE follows commands, no focal abnormalities noted Psych:  Normal affect    Relevant CV Studies: Echo 10/13/22 IMPRESSIONS     1. Left ventricular ejection fraction, by estimation, is 45%. The left  ventricle has mildly decreased function. The left ventricle demonstrates  regional wall motion  abnormalities (see scoring diagram/findings for  description). Left ventricular diastolic   function could not be evaluated. There is the interventricular septum is  flattened in systole and diastole, consistent with right ventricular  pressure and volume overload. Abnormal septal motion related to  intraventricular conduction delay.   2. Right ventricular systolic function is normal. The right ventricular  size is normal. There is mildly elevated pulmonary artery systolic  pressure. The estimated right ventricular systolic pressure is 36.7 mmHg.   3. Left atrial size was severely dilated.   4. Right atrial size was mild to moderately dilated.   5. A small pericardial effusion is present. The pericardial effusion is  circumferential.   6. Central mitral regurgitation with blunting of the right sided  pulmonary veins and suboptimal PISA shell. 2D MVA 6.09 cm2 with systolic  and diastolic doming of the anterior mitral valve leaflet. The mitral  valve is degenerative. Severe mitral valve  regurgitation. No evidence of mitral stenosis. Moderate mitral annular  calcification.   7. The tricuspid valve is abnormal. Tricuspid valve regurgitation is mild  to moderate.   8. The aortic valve is tricuspid. Aortic valve regurgitation is not  visualized. No aortic stenosis is present.   9. The inferior vena cava is dilated in size with <50% respiratory  variability, suggesting right atrial pressure of 15 mmHg.   Comparison(s): Prior images reviewed side by side. Mitral regurgitation is  worse.   FINDINGS   Left Ventricle: Left ventricular ejection fraction, by estimation, is  45%. The left ventricle has mildly decreased function. The left ventricle  demonstrates regional wall motion abnormalities. The left ventricular  internal cavity  size was normal in  size. There is no left ventricular hypertrophy. The interventricular  septum is flattened in systole and diastole, consistent with right   ventricular pressure and volume overload. Left ventricular diastolic  function could not be evaluated due to mitral  annular calcification (moderate or greater). Left ventricular diastolic  function could not be evaluated.     LV Wall Scoring:  The anterior septum, mid inferoseptal segment, and basal inferoseptal  segment  are hypokinetic. Abnormal septal motion related to intraventricular  conduction delay.   Right Ventricle: The right ventricular size is normal. No increase in  right ventricular wall thickness. Right ventricular systolic function is  normal. There is mildly elevated pulmonary artery systolic pressure. The  tricuspid regurgitant velocity is 2.33   m/s, and with an assumed right atrial pressure of 15 mmHg, the estimated  right ventricular systolic pressure is 36.7 mmHg.   Left Atrium: Left atrial size was severely dilated.   Right Atrium: Right atrial size was mild to moderately dilated.   Pericardium: A small pericardial effusion is present. The pericardial  effusion is circumferential.   Mitral Valve: Central mitral regurgitation with blunting of the right  sided pulmonary veins and suboptimal PISA shell. 2D MVA 6.09 cm2 with  systolic and diastolic doming of the anterior mitral valve leaflet. The  mitral valve is degenerative in  appearance. Moderate mitral annular calcification. Severe mitral valve  regurgitation. No evidence of mitral valve stenosis.   Tricuspid Valve: The tricuspid valve is abnormal. Tricuspid valve  regurgitation is mild to moderate.   Aortic Valve: The aortic valve is tricuspid. Aortic valve regurgitation is  not visualized. No aortic stenosis is present.   Pulmonic Valve: The pulmonic valve was normal in structure. Pulmonic valve  regurgitation is mild. No evidence of pulmonic stenosis.   Aorta: The aortic root and ascending aorta are structurally normal, with  no evidence of dilitation.   Venous: The inferior vena cava is  dilated in size with less than 50%  respiratory variability, suggesting right atrial pressure of 15 mmHg.   IAS/Shunts: No atrial level shunt detected by color flow Doppler.    Echo 09/19/22 IMPRESSIONS     1. Left ventricular ejection fraction, by estimation, is 40 to 45%. The  left ventricle has mildly decreased function. The left ventricle  demonstrates global hypokinesis. Left ventricular diastolic parameters are  indeterminate.   2. Right ventricular systolic function is normal. The right ventricular  size is normal. There is normal pulmonary artery systolic pressure.   3. Left atrial size was moderately dilated.   4. Right atrial size was moderately dilated.   5. A small pericardial effusion is present. The pericardial effusion is  circumferential.   6. The mitral valve is abnormal. Moderate mitral valve regurgitation. No  evidence of mitral stenosis.   7. The tricuspid valve is abnormal.   8. The aortic valve is tricuspid. There is mild calcification of the  aortic valve. There is mild thickening of the aortic valve. Aortic valve  regurgitation is not visualized. No aortic stenosis is present.   9. Aortic dilatation noted. There is mild dilatation of the ascending  aorta, measuring 37 mm.  10. The inferior vena cava is normal in size with greater than 50%  respiratory variability, suggesting right atrial pressure of 3 mmHg.   Comparison(s): Echocardiogram done 07/24/20 showed an EF of 30-35%.   FINDINGS   Left Ventricle: Left ventricular ejection fraction, by estimation, is 40  to 45%. The  left ventricle has mildly decreased function. The left  ventricle demonstrates global hypokinesis. Global longitudinal strain  performed but not reported based on  interpreter judgement due to suboptimal tracking. The left ventricular  internal cavity size was normal in size. There is no left ventricular  hypertrophy. Left ventricular diastolic parameters are indeterminate.   Right  Ventricle: The right ventricular size is normal. Right vetricular  wall thickness was not well visualized. Right ventricular systolic  function is normal. There is normal pulmonary artery systolic pressure.  The tricuspid regurgitant velocity is 2.44  m/s, and with an assumed right atrial pressure of 3 mmHg, the estimated  right ventricular systolic pressure is 26.8 mmHg.   Left Atrium: Left atrial size was moderately dilated.   Right Atrium: Right atrial size was moderately dilated.   Pericardium: A small pericardial effusion is present. The pericardial  effusion is circumferential.   Mitral Valve: The mitral valve is abnormal. There is mild thickening of  the mitral valve leaflet(s). There is mild calcification of the mitral  valve leaflet(s). Mild mitral annular calcification. Moderate mitral valve  regurgitation. No evidence of mitral   valve stenosis.   Tricuspid Valve: The tricuspid valve is abnormal. Tricuspid valve  regurgitation is mild . No evidence of tricuspid stenosis.   Aortic Valve: The aortic valve is tricuspid. There is mild calcification  of the aortic valve. There is mild thickening of the aortic valve. There  is mild aortic valve annular calcification. Aortic valve regurgitation is  not visualized. No aortic stenosis   is present. Aortic valve mean gradient measures 3.2 mmHg. Aortic valve  peak gradient measures 7.1 mmHg. Aortic valve area, by VTI measures 1.68  cm.   Pulmonic Valve: The pulmonic valve was not well visualized. Pulmonic valve  regurgitation is mild. No evidence of pulmonic stenosis.   Aorta: The aortic root is normal in size and structure and aortic  dilatation noted. There is mild dilatation of the ascending aorta,  measuring 37 mm.   Venous: The inferior vena cava is normal in size with greater than 50%  respiratory variability, suggesting right atrial pressure of 3 mmHg.   IAS/Shunts: No atrial level shunt detected by color flow  Doppler.       Laboratory Data:  High Sensitivity Troponin:  No results for input(s): "TROPONINIHS" in the last 720 hours.   Chemistry Recent Labs  Lab 10/12/22 1027 10/13/22 0034  NA 134* 137  K 4.8 4.5  CL 100 106  CO2 24 24  GLUCOSE 95 93  BUN 39* 34*  CREATININE 2.27* 1.71*  CALCIUM 10.5* 9.4  GFRNONAA 21* 30*  ANIONGAP 10 7    Recent Labs  Lab 10/12/22 1027  PROT 7.2  ALBUMIN 3.6  AST 21  ALT 15  ALKPHOS 63  BILITOT 0.7   Lipids No results for input(s): "CHOL", "TRIG", "HDL", "LABVLDL", "LDLCALC", "CHOLHDL" in the last 168 hours.  Hematology Recent Labs  Lab 10/12/22 1416  WBC 6.3  RBC 4.66  HGB 14.9  HCT 45.0  MCV 96.6  MCH 32.0  MCHC 33.1  RDW 12.5  PLT 244   Thyroid  Recent Labs  Lab 10/12/22 1422  TSH 0.996    BNPNo results for input(s): "BNP", "PROBNP" in the last 168 hours.  DDimer No results for input(s): "DDIMER" in the last 168 hours.   Radiology/Studies:  ECHOCARDIOGRAM COMPLETE  Result Date: 10/13/2022    ECHOCARDIOGRAM REPORT   Patient Name:   [name] [facility] Date of Exam:  10/13/2022 Medical Rec #:  [id number]         Height:       63.0 in Accession #:    [id number]        Weight:       197.4 lb Date of Birth:  1940/05/02         BSA:          1.923 m Patient Age:    82 years          BP:           112/57 mmHg Patient Gender: F                 HR:           58 bpm. Exam Location:  Inpatient Procedure: 2D Echo, Cardiac Doppler and Color Doppler Indications:    CHF - Systolic  History:        Patient has prior history of Echocardiogram examinations, most                 recent 09/18/2021. HFrEF and Cardiomyopathy, Arrythmias:Atrial                 Fibrillation and LBBB, Signs/Symptoms:Dizziness/Lightheadedness                 and Edema; Risk Factors:Hypertension and Diabetes.  Sonographer:    Milda Smart Referring Phys: [id number] PING T ZHANG IMPRESSIONS  1. Left ventricular ejection fraction, by estimation, is 45%. The left ventricle has  mildly decreased function. The left ventricle demonstrates regional wall motion abnormalities (see scoring diagram/findings for description). Left ventricular diastolic  function could not be evaluated. There is the interventricular septum is flattened in systole and diastole, consistent with right ventricular pressure and volume overload. Abnormal septal motion related to intraventricular conduction delay.  2. Right ventricular systolic function is normal. The right ventricular size is normal. There is mildly elevated pulmonary artery systolic pressure. The estimated right ventricular systolic pressure is 36.7 mmHg.  3. Left atrial size was severely dilated.  4. Right atrial size was mild to moderately dilated.  5. A small pericardial effusion is present. The pericardial effusion is circumferential.  6. Central mitral regurgitation with blunting of the right sided pulmonary veins and suboptimal PISA shell. 2D MVA 6.09 cm2 with systolic and diastolic doming of the anterior mitral valve leaflet. The mitral valve is degenerative. Severe mitral valve regurgitation. No evidence of mitral stenosis. Moderate mitral annular calcification.  7. The tricuspid valve is abnormal. Tricuspid valve regurgitation is mild to moderate.  8. The aortic valve is tricuspid. Aortic valve regurgitation is not visualized. No aortic stenosis is present.  9. The inferior vena cava is dilated in size with <50% respiratory variability, suggesting right atrial pressure of 15 mmHg. Comparison(s): Prior images reviewed side by side. Mitral regurgitation is worse. FINDINGS  Left Ventricle: Left ventricular ejection fraction, by estimation, is 45%. The left ventricle has mildly decreased function. The left ventricle demonstrates regional wall motion abnormalities. The left ventricular internal cavity size was normal in size. There is no left ventricular hypertrophy. The interventricular septum is flattened in systole and diastole, consistent with  right ventricular pressure and volume overload. Left ventricular diastolic function could not be evaluated due to mitral annular calcification (moderate or greater). Left ventricular diastolic function could not be evaluated.  LV Wall Scoring: The anterior septum, mid inferoseptal segment, and basal inferoseptal segment are hypokinetic. Abnormal septal motion related to intraventricular conduction delay. Right Ventricle: The  right ventricular size is normal. No increase in right ventricular wall thickness. Right ventricular systolic function is normal. There is mildly elevated pulmonary artery systolic pressure. The tricuspid regurgitant velocity is 2.33  m/s, and with an assumed right atrial pressure of 15 mmHg, the estimated right ventricular systolic pressure is 36.7 mmHg. Left Atrium: Left atrial size was severely dilated. Right Atrium: Right atrial size was mild to moderately dilated. Pericardium: A small pericardial effusion is present. The pericardial effusion is circumferential. Mitral Valve: Central mitral regurgitation with blunting of the right sided pulmonary veins and suboptimal PISA shell. 2D MVA 6.09 cm2 with systolic and diastolic doming of the anterior mitral valve leaflet. The mitral valve is degenerative in appearance. Moderate mitral annular calcification. Severe mitral valve regurgitation. No evidence of mitral valve stenosis. Tricuspid Valve: The tricuspid valve is abnormal. Tricuspid valve regurgitation is mild to moderate. Aortic Valve: The aortic valve is tricuspid. Aortic valve regurgitation is not visualized. No aortic stenosis is present. Pulmonic Valve: The pulmonic valve was normal in structure. Pulmonic valve regurgitation is mild. No evidence of pulmonic stenosis. Aorta: The aortic root and ascending aorta are structurally normal, with no evidence of dilitation. Venous: The inferior vena cava is dilated in size with less than 50% respiratory variability, suggesting right atrial  pressure of 15 mmHg. IAS/Shunts: No atrial level shunt detected by color flow Doppler.  LEFT VENTRICLE PLAX 2D LVIDd:         4.10 cm LVIDs:         3.20 cm LV PW:         0.70 cm LV IVS:        0.70 cm LVOT diam:     1.90 cm LV SV:         50 LV SV Index:   26 LVOT Area:     2.84 cm  LV Volumes (MOD) LV vol d, MOD A2C: 70.2 ml LV vol d, MOD A4C: 67.5 ml LV vol s, MOD A2C: 39.2 ml LV vol s, MOD A4C: 36.4 ml LV SV MOD A2C:     31.0 ml LV SV MOD A4C:     67.5 ml LV SV MOD BP:      30.8 ml RIGHT VENTRICLE          IVC RV Basal diam:  3.10 cm  IVC diam: 2.20 cm LEFT ATRIUM             Index        RIGHT ATRIUM           Index LA diam:        5.20 cm 2.70 cm/m   RA Area:     20.80 cm LA Vol (A2C):   61.3 ml 31.85 ml/m  RA Volume:   60.10 ml  31.25 ml/m LA Vol (A4C):   79.0 ml 41.08 ml/m LA Biplane Vol: 62.5 ml 32.50 ml/m  AORTIC VALVE LVOT Vmax:   90.60 cm/s LVOT Vmean:  69.000 cm/s LVOT VTI:    0.177 m  AORTA Ao Root diam: 2.80 cm Ao Asc diam:  3.70 cm MR Peak grad:    92.0 mmHg    TRICUSPID VALVE MR Mean grad:    61.0 mmHg    TR Peak grad:   21.7 mmHg MR Vmax:         479.50 cm/s  TR Mean grad:   16.0 mmHg MR Vmean:        372.0 cm/s   TR Vmax:  233.00 cm/s MR PISA:         1.57 cm     TR Vmean:       193.0 cm/s MR PISA Eff ROA: 11 mm MR PISA Radius:  0.50 cm      SHUNTS                               Systemic VTI:  0.18 m                               Systemic Diam: 1.90 cm Riley Lam MD Electronically signed by Riley Lam MD Signature Date/Time: 10/13/2022/9:27:28 AM    Final    DG Chest Portable 1 View  Result Date: 10/12/2022 CLINICAL DATA:  Provided history: Weakness. EXAM: PORTABLE CHEST 1 VIEW COMPARISON:  Prior chest radiograph 07/03/2019 and earlier. FINDINGS: Heart size at the upper limits of normal. Aortic atherosclerosis. No appreciable airspace consolidation. Redemonstrated chronic calcified granuloma within the left lung apex, and calcified mediastinal/hilar lymph nodes.  No evidence of pleural effusion or pneumothorax. No acute osseous abnormality identified. Degenerative changes of the spine. IMPRESSION: 1. No evidence of an acute cardiopulmonary abnormality. 2. Stable chronic sequelae of granulomatous disease. 3. Aortic Atherosclerosis (ICD10-I70.0). Electronically Signed   By: Jackey Loge D.O.   On: 10/12/2022 11:35     Assessment and Plan:   Hypotension/AKI/dehydration related to diuretic now improved with IV fluids/Orthostatic hypotension improved but present.  (Prior Cr 0.96 in March 2023 and up to 1.57 in 2022. Hold Entresto, spironolactone torsemide for now.  Marcelline Deist on hold as well   BP in office was 106 systolic prior to increase of diuretic. Though in Nov 2023 was 122/76  may need to hold meds until OV. And even then lower doses with her wt loss. Acute on chronic HFrEF recently with need to increase torsemide -- this may be due to MR.  She believes ER MD increased on visit for dental pain.  Currently no edema, euvolemic   her EF is stable at 45% hold torsemide  MR has progressed from mod to severe MR now.  Monitor closely for recurrent CHF  will need TEE --she was without issues on cardiology visit 6/7  once cleared from hypotension could begin work up for MR CAD with hx of non obstructive CAD 2002 and neg nuc in 2017, though no angina she does have wall motion abnormalities on echo this admit  HTN with hypotension as above.   Permanent atrial fib on eliquis renal adjustment for now. May need to hold if further work up with severe MR and wall motion abnormality on echo.  But Cr would need to be improved. Was on toprol XL 100 --now on lopressor 25 BID   monitor  DM2/deconditioning/DM neuropathy per IM Wt loss of 21 lbs from 02/17/2022 until 09/2022 and 7 lbs more since then.  Not much of appetite.  ? Cardiac cachexia for total wt loss of 28 lbs since vs other underlying issue   Risk Assessment/Risk Scores:   CHA2DS2-VASc Score = 7   This indicates a 11.2%  annual risk of stroke. The patient's score is based upon: CHF History: 1 HTN History: 1 Diabetes History: 1 Stroke History: 0 Vascular Disease History: 1 Age Score: 2 Gender Score: 1    For questions or updates, please contact [facility] Please consult [email or web address] for contact info under  Signed, Nada Boozer, NP  10/13/2022 11:04 AM  Patient seen and examined, note reviewed with the signed Advanced Practice Provider. I personally reviewed laboratory data, imaging studies and relevant notes. I independently examined the patient and formulated the important aspects of the plan. I have personally discussed the plan with the patient and/or family. Comments or changes to the note/plan are indicated below.  Orthostatic hypotension with recent dehydration Acute on chronic heart failure with reduced ejection fraction-resolved Severe mitral regurgitation Nonobstructive coronary artery disease Permanent atrial fibrillation Type 2 diabetes Cachexia with recent 12 pounds weight loss starting November 2023  The primary issue with her hospitalization is the orthostatic hypotension.  Agree with holding the Entresto, spironolactone, Farxiga and torsemide.  Will continue to monitor and if needed will support with midodrine.  In terms of the severe MR clinically she does not appear to be decompensated or volume overloaded.  Will hold off on repeat starting Lasix especially with her orthostatic hypotension this can be reassessed in outpatient setting.  No anginal symptoms.  Continue her current regimen.  She is on pravastatin.  Not on aspirin due to the use of Eliquis.  Diabetes type 2 is being managed by the primary team.    Thomasene Ripple DO, MS [facility] Attending Cardiologist [facility]  [address] [place], [place] [address] [phone] Website: [email or web address]

## 2022-10-13 NOTE — Evaluation (Signed)
Physical Therapy Evaluation Patient Details Name: [name] MRN: [id number] DOB: 02/10/1941 Today's Date: 10/13/2022  History of Present Illness  Patient is a 82 y.o. female presenting with weakness and lightheadedness. PMH significant for medical history significant of chronic HFrEF with LVEF 30-35%, cardiomyopathy, PAF on Eliquis, HTN, LBBB, IIDM,  DM neuropathy.   Clinical Impression  Upon evaluation, patient received supine in HOB elevated. Prior to admission, patient was independent in all functional mobility and ADLs. She reports having assistive devices at home that is seldomly used. Currently the patient is able perform all bed mobility, transfers, and functional mobility without physical assistance or instances of LOB. Orthostatic testing performed; pt significant for orthostatic hypotension but asymptomatic in all positions. No further acute PT needed. Recommend patient to return home without additional rehab.   Orthostatic Blood Pressure: Blood pressure:   lying 122/71, sitting 120/78, standing 95/72 Pulse:   lying 64, sitting 88, standing 102       Assistance Recommended at Discharge PRN  If plan is discharge home, recommend the following:  Can travel by private vehicle  Assist for transportation;Help with stairs or ramp for entrance        Equipment Recommendations None recommended by PT  Recommendations for Other Services       Functional Status Assessment Patient has not had a recent decline in their functional status     Precautions / Restrictions Restrictions Weight Bearing Restrictions: No      Mobility  Bed Mobility Overal bed mobility: Modified Independent             General bed mobility comments: Performed without physical assitance; increased time    Transfers Overall transfer level: Modified independent Equipment used: None               General transfer comment: Transferred without physical assistance and used bilateral UE  for power up.    Ambulation/Gait Ambulation/Gait assistance: Supervision Gait Distance (Feet): 120 Feet Assistive device: None Gait Pattern/deviations: Step-through pattern, Decreased stride length Gait velocity: Decreased Gait velocity interpretation: <1.8 ft/sec, indicate of risk for recurrent falls   General Gait Details: Patient ambulated without report of increased symptoms or instances of LOB; HR monitored throughout session (maintained between 110-130s).  Stairs            Wheelchair Mobility     Tilt Bed    Modified Rankin (Stroke Patients Only)       Balance Overall balance assessment: Needs assistance Sitting-balance support: No upper extremity supported, Feet supported Sitting balance-Leahy Scale: Fair Sitting balance - Comments: Able to sit EOB   Standing balance support: No upper extremity supported, During functional activity Standing balance-Leahy Scale: Fair Standing balance comment: Able to perform static and dynamic activites without UE support                             Pertinent Vitals/Pain Pain Assessment Pain Assessment: No/denies pain    Home Living Family/patient expects to be discharged to:: Private residence Living Arrangements: Alone Available Help at Discharge: Family (Greatgrand fdaughter) Type of Home: House Home Access: Stairs to enter Entrance Stairs-Rails: Right Entrance Stairs-Number of Steps: 2 Back Door   Home Layout: One level Home Equipment: BSC/3in1;Cane - single point;Rolling Walker (2 wheels);Grab bars - tub/shower;Shower seat Additional Comments: Patient rarely uses the equipemnt    Prior Function Prior Level of Function : Independent/Modified Independent  Hand Dominance   Dominant Hand: Right    Extremity/Trunk Assessment   Upper Extremity Assessment Upper Extremity Assessment: Overall WFL for tasks assessed    Lower Extremity Assessment Lower Extremity  Assessment: Overall WFL for tasks assessed    Cervical / Trunk Assessment Cervical / Trunk Assessment: Normal  Communication   Communication: No difficulties  Cognition Arousal/Alertness: Awake/alert Behavior During Therapy: WFL for tasks assessed/performed Overall Cognitive Status: Within Functional Limits for tasks assessed                                 General Comments: Pleasant throughout session        General Comments General comments (skin integrity, edema, etc.): Patient significant for Orthostatic Hypotension per testing but asymptomatic    Exercises     Assessment/Plan    PT Assessment Patient does not need any further PT services  PT Problem List         PT Treatment Interventions      PT Goals (Current goals can be found in the Care Plan section)  Acute Rehab PT Goals Patient Stated Goal: Patient would like to return home PT Goal Formulation: All assessment and education complete, DC therapy    Frequency       Co-evaluation               AM-PAC PT "6 Clicks" Mobility  Outcome Measure Help needed turning from your back to your side while in a flat bed without using bedrails?: None Help needed moving from lying on your back to sitting on the side of a flat bed without using bedrails?: None Help needed moving to and from a bed to a chair (including a wheelchair)?: None Help needed standing up from a chair using your arms (e.g., wheelchair or bedside chair)?: None Help needed to walk in hospital room?: None Help needed climbing 3-5 steps with a railing? : A Little 6 Click Score: 23    End of Session Equipment Utilized During Treatment: Gait belt Activity Tolerance: Patient tolerated treatment well Patient left: in chair;with call bell/phone within reach;with chair alarm set Nurse Communication: Mobility status PT Visit Diagnosis: Difficulty in walking, not elsewhere classified (R26.2)    Time: 1610-9604 PT Time Calculation  (min) (ACUTE ONLY): 30 min   Charges:   PT Evaluation $PT Eval Low Complexity: 1 Low PT Treatments $Therapeutic Activity: 8-22 mins PT General Charges $$ ACUTE PT VISIT: 1 Visit         Christene Lye, SPT Acute Rehabilitation Services [phone] Secure chat preferred    Christene Lye 10/13/2022, 1:54 PM

## 2022-10-13 NOTE — Hospital Course (Addendum)
82 y.o.f w/ chronic HFrEF (LVEF40-45% in 2018, 35% in 11/2019 and 07/2020, at 40-45% by echo in 09/2021), CAD (moderate disease by catheterization in 2002, NST in 2017 showing possible mild anterior septal and apical ischemia and medical management pursued),cardiomyopathy, PAF on Eliquis, HTN, LBBB, IIDM,  DM neuropathy, presented with worsening of generalized weakness and lightheadedness  Patient torsemide dosage was adjusted recently increased dosage about 2 weeks ago to address worsening of peripheral edema. She lost 7-8 pound in last 2 weeks.she has been feeling very tired and lightheadedness but denies any fall, no chest pain or shortness of breath. She was at PCP office and noted to have low BP and sent to ED ED: Bp 91/53, heart rate 75, nonhypoxic.  Blood work showed AKI creatinine 2.2 compared to baseline 0.9 about 1 year ago,WBC 6.3 K4.8.  X-ray showed no acute infiltrates.  TSH 0.9 lactic acid 1.6. Patient was given IV bolus of 500 mL x 2, AND admitted for further management

## 2022-10-13 NOTE — Plan of Care (Signed)
  Problem: Education: Goal: Knowledge of General Education information will improve Description: Including pain rating scale, medication(s)/side effects and non-pharmacologic comfort measures Outcome: Progressing   Problem: Health Behavior/Discharge Planning: Goal: Ability to manage health-related needs will improve Outcome: Progressing   Problem: Clinical Measurements: Goal: Ability to maintain clinical measurements within normal limits will improve Outcome: Progressing Goal: Will remain free from infection Outcome: Progressing Goal: Diagnostic test results will improve Outcome: Progressing Goal: Cardiovascular complication will be avoided Outcome: Progressing   Problem: Activity: Goal: Risk for activity intolerance will decrease Outcome: Progressing   Problem: Nutrition: Goal: Adequate nutrition will be maintained Outcome: Progressing   Problem: Coping: Goal: Level of anxiety will decrease Outcome: Progressing   Problem: Elimination: Goal: Will not experience complications related to bowel motility Outcome: Progressing Goal: Will not experience complications related to urinary retention Outcome: Progressing   

## 2022-10-13 NOTE — Progress Notes (Signed)
Heart Failure Navigator Progress Note  Assessed for Heart & Vascular TOC clinic readiness.  Patient does not meet criteria due to not acute Heart Failure, hospitalization due to AKI, ECHO stable from prior , EF 45%.   Navigator will sign off at this time.   Rhae Hammock, BSN, Scientist, clinical (histocompatibility and immunogenetics) Only

## 2022-10-13 NOTE — Progress Notes (Signed)
  Echocardiogram 2D Echocardiogram has been performed.  [name] 10/13/2022, 8:57 AM

## 2022-10-13 NOTE — Progress Notes (Signed)
PROGRESS NOTE [name]  ZOX:[id number] DOB: 1940/07/01 DOA: 10/12/2022 PCP: [name], MD  Brief Narrative/Hospital Course: 82 y.o.f w/ chronic HFrEF (LVEF40-45% in 2018, 35% in 11/2019 and 07/2020, at 40-45% by echo in 09/2021), CAD (moderate disease by catheterization in 2002, NST in 2017 showing possible mild anterior septal and apical ischemia and medical management pursued),cardiomyopathy, PAF on Eliquis, HTN, LBBB, IIDM,  DM neuropathy, presented with worsening of generalized weakness and lightheadedness  Patient torsemide dosage was adjusted recently increased dosage about 2 weeks ago to address worsening of peripheral edema. She lost 7-8 pound in last 2 weeks.she has been feeling very tired and lightheadedness but denies any fall, no chest pain or shortness of breath. She was at PCP office and noted to have low BP and sent to ED ED: Bp 91/53, heart rate 75, nonhypoxic.  Blood work showed AKI creatinine 2.2 compared to baseline 0.9 about 1 year ago,WBC 6.3 K4.8.  X-ray showed no acute infiltrates.  TSH 0.9 lactic acid 1.6. Patient was given IV bolus of 500 mL x 2, AND admitted for further management  Got>TTE shows "lvef 45%.The left ventricle has mildly decreased function. The left ventricle demonstrates regional wall motion abnormalities (see scoring diagram/findings for description).here is the interventricular septum is  flattened in systole and diastole, consistent with right ventricular  pressure and volume overload. Abnormal septal motion related to intraventricular conduction delay.     Subjective: Patient seen and examined this morning Resting comfortably on the bedside chair worked with PT OT feels much better today Orthostatic vitals still abnormal   Assessment and Plan: Principal Problem:   AKI (acute kidney injury) (HCC)   Orthostatic hypotension Near syncope Generalized weakness Deconditioning/debility: Suspect in the setting of orthostatic hypotension.  Vitals  overall improving on IV fluid hydration 500 cc bolus today as orthostatic positive symptoms improving continue PT OT.  Continue to hold CHF meds currently has been consulted.  Will discontinue her Flomax.  Chronic HFrEF: TTE shows "lvef 45%.The left ventricle has mildly decreased function. The left ventricle demonstrates regional wall motion abnormalities (see scoring diagram/findings for description).here is the interventricular septum is  flattened in systole and diastole, consistent with right ventricular  pressure and volume overload. Abnormal septal motion related to intraventricular conduction delay.  Request cardiology evaluation adjust meds  Severe MR- monitor volume status. HLD continue pravastatin Hypertension BP stable holding meds, except for metoprolol PAF continue Eliquis Diabetes mellitus holding Farxiga given SSI for now continue gabapentin Recent Labs  Lab 10/12/22 1706 10/12/22 1847 10/13/22 0734 10/13/22 1129  GLUCAP 102*  --  86 88  HGBA1C  --  5.9*  --   --    Weight loss ?21 lb, since 02/2022- ?cardiac cachexia. RD consulted.  Class i Obesity:Patient's Body mass index is 34.97 kg/m. : Will benefit with PCP follow-up, weight loss  healthy lifestyle and outpatient sleep evaluation.   DVT prophylaxis: apixaban (ELIQUIS) tablet 2.5 mg Start: 10/12/22 2200 Code Status:   Code Status: Full Code Family Communication: plan of care discussed with patient at bedside. Patient status is:  admitted as observation but remains hospitalized for ongoing  because of hypotension Level of care: Telemetry Medical   Dispo: The patient is from: HOME            Anticipated disposition: TBD awaiting cardiology clearance will likely need to challenge her w/ CHF meds  Objective: Vitals last 24 hrs: Vitals:   10/13/22 0021 10/13/22 0437 10/13/22 0807 10/13/22 1133  BP: (!) 90/57 Marland Kitchen)  93/54 (!) 112/57 112/60  Pulse: (!) 54 64 65 65  Resp: 18 18 18 19   Temp: 97.8 F (36.6 C) 97.8 F  (36.6 C)  97.8 F (36.6 C)  TempSrc: Oral Oral  Oral  SpO2: 98% 97% 95% 95%  Weight:  89.5 kg    Height:       Weight change:   Physical Examination: General exam: alert awake, older than stated age HEENT:Oral mucosa moist, Ear/Nose WNL grossly Respiratory system: bilaterally clear BS, no use of accessory muscle Cardiovascular system: S1 & S2 +, No JVD. Gastrointestinal system: Abdomen soft,NT,ND, BS+ Nervous System:Alert, awake, moving extremities. Extremities: LE edema neg,distal peripheral pulses palpable.  Skin: No rashes,no icterus. MSK: Normal muscle bulk,tone, power  Medications reviewed:  Scheduled Meds:  apixaban  2.5 mg Oral BID   gabapentin  100 mg Oral QHS   insulin aspart  0-9 Units Subcutaneous TID WC   metoprolol tartrate  25 mg Oral BID   pantoprazole  40 mg Oral Daily   pravastatin  40 mg Oral QHS   sodium chloride flush  3 mL Intravenous Q12H   Continuous Infusions:  sodium chloride        Diet Order             Diet heart healthy/carb modified Room service appropriate? Yes; Fluid consistency: Thin  Diet effective now                            Intake/Output Summary (Last 24 hours) at 10/13/2022 1151 Last data filed at 10/12/2022 1704 Gross per 24 hour  Intake 1000 ml  Output --  Net 1000 ml   Net IO Since Admission: 1,000 mL [10/13/22 1151]  Wt Readings from Last 3 Encounters:  10/13/22 89.5 kg  09/30/22 91.6 kg  09/18/22 92.5 kg     Unresulted Labs (From admission, onward)     Start     Ordered   10/13/22 0500  Basic metabolic panel  Daily,   R     Comments: As Scheduled for 5 days    10/12/22 1633   10/12/22 1027  CBC with Differential  Once,   STAT        10/12/22 1026   10/12/22 1027  Culture, blood (routine x 2)  BLOOD CULTURE X 2,   R (with STAT occurrences)      10/12/22 1026          Data Reviewed: I have personally reviewed following labs and imaging studies CBC: Recent Labs  Lab 10/12/22 1416  WBC 6.3   NEUTROABS 3.9  HGB 14.9  HCT 45.0  MCV 96.6  PLT 244   Basic Metabolic Panel: Recent Labs  Lab 10/12/22 1027 10/13/22 0034  NA 134* 137  K 4.8 4.5  CL 100 106  CO2 24 24  GLUCOSE 95 93  BUN 39* 34*  CREATININE 2.27* 1.71*  CALCIUM 10.5* 9.4   GFR: Estimated Creatinine Clearance: 27.8 mL/min (A) (by C-G formula based on SCr of 1.71 mg/dL (H)). Liver Function Tests: Recent Labs  Lab 10/12/22 1027  AST 21  ALT 15  ALKPHOS 63  BILITOT 0.7  PROT 7.2  ALBUMIN 3.6   Recent Labs    10/12/22 1847  HGBA1C 5.9*   CBG: Recent Labs  Lab 10/12/22 1706 10/13/22 0734 10/13/22 1129  GLUCAP 102* 86 88   Recent Labs    10/12/22 1422  TSH 0.996   Sepsis Labs: Recent  Labs  Lab 10/12/22 1400  LATICACIDVEN 1.6  No results found for this or any previous visit (from the past 240 hour(s)).  Antimicrobials: Anti-infectives (From admission, onward)    None      Culture/Microbiology    Component Value Date/Time   SDES URINE, CLEAN CATCH 02/25/2016 0813   SPECREQUEST NONE 02/25/2016 0813   CULT >=100,000 COLONIES/mL ESCHERICHIA COLI (A) 02/25/2016 0813   REPTSTATUS 02/27/2016 FINAL 02/25/2016 0813   Radiology Studies: ECHOCARDIOGRAM COMPLETE  Result Date: 10/13/2022    ECHOCARDIOGRAM REPORT   Patient Name:   [name] [facility] Date of Exam: 10/13/2022 Medical Rec #:  [id number]         Height:       63.0 in Accession #:    [id number]        Weight:       197.4 lb Date of Birth:  04-Mar-1941         BSA:          1.923 m Patient Age:    82 years          BP:           112/57 mmHg Patient Gender: F                 HR:           58 bpm. Exam Location:  Inpatient Procedure: 2D Echo, Cardiac Doppler and Color Doppler Indications:    CHF - Systolic  History:        Patient has prior history of Echocardiogram examinations, most                 recent 09/18/2021. HFrEF and Cardiomyopathy, Arrythmias:Atrial                 Fibrillation and LBBB, Signs/Symptoms:Dizziness/Lightheadedness                  and Edema; Risk Factors:Hypertension and Diabetes.  Sonographer:    Milda Smart Referring Phys: [id number] PING T ZHANG IMPRESSIONS  1. Left ventricular ejection fraction, by estimation, is 45%. The left ventricle has mildly decreased function. The left ventricle demonstrates regional wall motion abnormalities (see scoring diagram/findings for description). Left ventricular diastolic  function could not be evaluated. There is the interventricular septum is flattened in systole and diastole, consistent with right ventricular pressure and volume overload. Abnormal septal motion related to intraventricular conduction delay.  2. Right ventricular systolic function is normal. The right ventricular size is normal. There is mildly elevated pulmonary artery systolic pressure. The estimated right ventricular systolic pressure is 36.7 mmHg.  3. Left atrial size was severely dilated.  4. Right atrial size was mild to moderately dilated.  5. A small pericardial effusion is present. The pericardial effusion is circumferential.  6. Central mitral regurgitation with blunting of the right sided pulmonary veins and suboptimal PISA shell. 2D MVA 6.09 cm2 with systolic and diastolic doming of the anterior mitral valve leaflet. The mitral valve is degenerative. Severe mitral valve regurgitation. No evidence of mitral stenosis. Moderate mitral annular calcification.  7. The tricuspid valve is abnormal. Tricuspid valve regurgitation is mild to moderate.  8. The aortic valve is tricuspid. Aortic valve regurgitation is not visualized. No aortic stenosis is present.  9. The inferior vena cava is dilated in size with <50% respiratory variability, suggesting right atrial pressure of 15 mmHg. Comparison(s): Prior images reviewed side by side. Mitral regurgitation is worse. FINDINGS  Left Ventricle: Left ventricular ejection fraction, by  estimation, is 45%. The left ventricle has mildly decreased function. The left ventricle  demonstrates regional wall motion abnormalities. The left ventricular internal cavity size was normal in size. There is no left ventricular hypertrophy. The interventricular septum is flattened in systole and diastole, consistent with right ventricular pressure and volume overload. Left ventricular diastolic function could not be evaluated due to mitral annular calcification (moderate or greater). Left ventricular diastolic function could not be evaluated.  LV Wall Scoring: The anterior septum, mid inferoseptal segment, and basal inferoseptal segment are hypokinetic. Abnormal septal motion related to intraventricular conduction delay. Right Ventricle: The right ventricular size is normal. No increase in right ventricular wall thickness. Right ventricular systolic function is normal. There is mildly elevated pulmonary artery systolic pressure. The tricuspid regurgitant velocity is 2.33  m/s, and with an assumed right atrial pressure of 15 mmHg, the estimated right ventricular systolic pressure is 36.7 mmHg. Left Atrium: Left atrial size was severely dilated. Right Atrium: Right atrial size was mild to moderately dilated. Pericardium: A small pericardial effusion is present. The pericardial effusion is circumferential. Mitral Valve: Central mitral regurgitation with blunting of the right sided pulmonary veins and suboptimal PISA shell. 2D MVA 6.09 cm2 with systolic and diastolic doming of the anterior mitral valve leaflet. The mitral valve is degenerative in appearance. Moderate mitral annular calcification. Severe mitral valve regurgitation. No evidence of mitral valve stenosis. Tricuspid Valve: The tricuspid valve is abnormal. Tricuspid valve regurgitation is mild to moderate. Aortic Valve: The aortic valve is tricuspid. Aortic valve regurgitation is not visualized. No aortic stenosis is present. Pulmonic Valve: The pulmonic valve was normal in structure. Pulmonic valve regurgitation is mild. No evidence of pulmonic  stenosis. Aorta: The aortic root and ascending aorta are structurally normal, with no evidence of dilitation. Venous: The inferior vena cava is dilated in size with less than 50% respiratory variability, suggesting right atrial pressure of 15 mmHg. IAS/Shunts: No atrial level shunt detected by color flow Doppler.  LEFT VENTRICLE PLAX 2D LVIDd:         4.10 cm LVIDs:         3.20 cm LV PW:         0.70 cm LV IVS:        0.70 cm LVOT diam:     1.90 cm LV SV:         50 LV SV Index:   26 LVOT Area:     2.84 cm  LV Volumes (MOD) LV vol d, MOD A2C: 70.2 ml LV vol d, MOD A4C: 67.5 ml LV vol s, MOD A2C: 39.2 ml LV vol s, MOD A4C: 36.4 ml LV SV MOD A2C:     31.0 ml LV SV MOD A4C:     67.5 ml LV SV MOD BP:      30.8 ml RIGHT VENTRICLE          IVC RV Basal diam:  3.10 cm  IVC diam: 2.20 cm LEFT ATRIUM             Index        RIGHT ATRIUM           Index LA diam:        5.20 cm 2.70 cm/m   RA Area:     20.80 cm LA Vol (A2C):   61.3 ml 31.85 ml/m  RA Volume:   60.10 ml  31.25 ml/m LA Vol (A4C):   79.0 ml 41.08 ml/m LA Biplane Vol: 62.5 ml 32.50 ml/m  AORTIC VALVE LVOT Vmax:   90.60 cm/s LVOT Vmean:  69.000 cm/s LVOT VTI:    0.177 m  AORTA Ao Root diam: 2.80 cm Ao Asc diam:  3.70 cm MR Peak grad:    92.0 mmHg    TRICUSPID VALVE MR Mean grad:    61.0 mmHg    TR Peak grad:   21.7 mmHg MR Vmax:         479.50 cm/s  TR Mean grad:   16.0 mmHg MR Vmean:        372.0 cm/s   TR Vmax:        233.00 cm/s MR PISA:         1.57 cm     TR Vmean:       193.0 cm/s MR PISA Eff ROA: 11 mm MR PISA Radius:  0.50 cm      SHUNTS                               Systemic VTI:  0.18 m                               Systemic Diam: 1.90 cm Riley Lam MD Electronically signed by Riley Lam MD Signature Date/Time: 10/13/2022/9:27:28 AM    Final    DG Chest Portable 1 View  Result Date: 10/12/2022 CLINICAL DATA:  Provided history: Weakness. EXAM: PORTABLE CHEST 1 VIEW COMPARISON:  Prior chest radiograph 07/03/2019 and earlier.  FINDINGS: Heart size at the upper limits of normal. Aortic atherosclerosis. No appreciable airspace consolidation. Redemonstrated chronic calcified granuloma within the left lung apex, and calcified mediastinal/hilar lymph nodes. No evidence of pleural effusion or pneumothorax. No acute osseous abnormality identified. Degenerative changes of the spine. IMPRESSION: 1. No evidence of an acute cardiopulmonary abnormality. 2. Stable chronic sequelae of granulomatous disease. 3. Aortic Atherosclerosis (ICD10-I70.0). Electronically Signed   By: Jackey Loge D.O.   On: 10/12/2022 11:35     LOS: 0 days   Lanae Boast, MD [facility]  10/13/2022, 11:51 AM

## 2022-10-14 LAB — GLUCOSE, CAPILLARY
Glucose-Capillary: 94 mg/dL (ref 70–99)
Glucose-Capillary: 113 mg/dL — ABNORMAL HIGH (ref 70–99)
Glucose-Capillary: 94 mg/dL (ref 70–99)
Glucose-Capillary: 98 mg/dL (ref 70–99)

## 2022-10-14 LAB — BASIC METABOLIC PANEL
Anion gap: 6 (ref 5–15)
BUN: 24 mg/dL — ABNORMAL HIGH (ref 8–23)
CO2: 24 mmol/L (ref 22–32)
Calcium: 9 mg/dL (ref 8.9–10.3)
Chloride: 107 mmol/L (ref 98–111)
Creatinine, Ser: 1.67 mg/dL — ABNORMAL HIGH (ref 0.44–1.00)
GFR, Estimated: 31 mL/min — ABNORMAL LOW (ref >60)
Glucose, Bld: 101 mg/dL — ABNORMAL HIGH (ref 70–99)
Potassium: 4.4 mmol/L (ref 3.5–5.1)
Sodium: 137 mmol/L (ref 135–145)

## 2022-10-14 LAB — CULTURE, BLOOD (ROUTINE X 2)

## 2022-10-14 MED ORDER — BOOST / RESOURCE BREEZE PO LIQD CUSTOM
1.0000 | Freq: Three times a day (TID) | ORAL | Status: DC
  Administered 2022-10-14: 1 via ORAL
  Administered 2022-10-15: 1 mL via ORAL
  Filled 2022-10-14: qty 1

## 2022-10-14 MED ORDER — METOPROLOL SUCCINATE ER 25 MG PO TB24
12.5000 mg | ORAL_TABLET | Freq: Every day | ORAL | Status: DC
  Administered 2022-10-15: 12.5 mg via ORAL
  Filled 2022-10-14 (×2): qty 1

## 2022-10-14 MED ORDER — MIDODRINE HCL 5 MG PO TABS
5.0000 mg | ORAL_TABLET | Freq: Three times a day (TID) | ORAL | Status: DC
  Administered 2022-10-14 – 2022-10-15 (×4): 5 mg via ORAL
  Filled 2022-10-14 (×4): qty 1

## 2022-10-14 MED ORDER — SODIUM CHLORIDE 0.9 % IV SOLN: INTRAVENOUS | Status: AC

## 2022-10-14 MED ORDER — ADULT MULTIVITAMIN W/MINERALS CH
1.0000 | ORAL_TABLET | Freq: Every day | ORAL | Status: DC
  Administered 2022-10-14 – 2022-10-15 (×2): 1 via ORAL
  Filled 2022-10-14 (×2): qty 1

## 2022-10-14 NOTE — Progress Notes (Signed)
Initial Nutrition Assessment  DOCUMENTATION CODES:   Obesity unspecified  INTERVENTION:  Liberalize diet to regular Boost Breeze po TID, each supplement provides 250 kcal and 9 grams of protein MVI with minerals daily  NUTRITION DIAGNOSIS:   Increased nutrient needs related to chronic illness (HF, cardiomyopathy, PAF) as evidenced by estimated needs.  GOAL:   Patient will meet greater than or equal to 90% of their needs  MONITOR:   PO intake, Supplement acceptance, Labs, Weight trends, Diet advancement, I & O's  REASON FOR ASSESSMENT:   Consult Assessment of nutrition requirement/status  ASSESSMENT:   Pt admitted with generalized weakness and lightheadedness. PMH significant for HFrEF (EF 30-35%), cardiomyopathy, PAF on Eliquis, HTN, LBBB, T2DM, DM neuropathy.  On admission, pt found to have AKI 2/2 dehydration, over diuresis.  Pt sitting up in chair at time of visit. She lives at home alone, therefore does not typically prepare meals often. She endorses eating at her baseline which includes 2 small meals per day such as a biscuit. She recalls eating foods that can be heated in a microwave. She does not drink nutrition supplements at home as she does not like the taste. Provided Boost Breeze for pt which she did not enjoy but feels that she can tolerate these.    Despite pt having presented to the ED for dental abscess on 6/19, she denies difficulty chewing/swallowing foods. She feels as though her mobility has been poor lately d/t weakness.   Meal completions: 7/2: 100% breakfast and lunch  She is uncertain about any recent weight changes but feels as though her clothes do not feel more loose or tight fitting. Reviewed weight history on file. Noted pt's weight to have increased last year, per review of chart pt reported eating more sweets and larger meals d/t family visiting. Since 02/2022, pt noted to have had a weight loss of 13.3% which is significant for time frame.  Given pt was dehydrated on admission, suspect this to be true dry weight loss.   Pt is at high nutrition risk and may have some degree of malnutrition but unable to diagnose at this time with current information. Will attempt to reassess on follow up.   Edema: mild pitting BLE  Medications: SSI 0-9 units TID, protonix, IV NaCl @ 132ml/hr  Labs: BUN 24, Cr 1.67, GFR 31, HgbA1c 5.9%, CBG's 88-97 x24 hours  NUTRITION - FOCUSED PHYSICAL EXAM:  Flowsheet Row Most Recent Value  Orbital Region No depletion  Upper Arm Region No depletion  Thoracic and Lumbar Region No depletion  Buccal Region Mild depletion  Temple Region No depletion  Clavicle Bone Region Moderate depletion  Clavicle and Acromion Bone Region Mild depletion  Scapular Bone Region No depletion  Dorsal Hand Mild depletion  Patellar Region No depletion  Anterior Thigh Region No depletion  Posterior Calf Region No depletion  Edema (RD Assessment) Mild  [BLE]  Hair Reviewed  Eyes Reviewed  Mouth Reviewed  Skin Reviewed  Nails Reviewed       Diet Order:   Diet Order             Diet regular Room service appropriate? Yes; Fluid consistency: Thin  Diet effective now                   EDUCATION NEEDS:   Education needs have been addressed  Skin:  Skin Assessment: Reviewed RN Assessment  Last BM:  7/2  Height:   Ht Readings from Last 1 Encounters:  10/12/22 5'  3" (1.6 m)    Weight:   Wt Readings from Last 1 Encounters:  10/14/22 89 kg    Ideal Body Weight:  52.3 kg  BMI:  Body mass index is 34.77 kg/m.  Estimated Nutritional Needs:   Kcal:  1400-1600  Protein:  70-85g  Fluid:  >/=1.5L  Drusilla Kanner, RDN, LDN Clinical Nutrition

## 2022-10-14 NOTE — Progress Notes (Signed)
PROGRESS NOTE [name]  ZOX:[id number] DOB: 1941-02-11 DOA: 10/12/2022 PCP: [name], MD  Brief Narrative/Hospital Course: 82 y.o.f w/ chronic HFrEF (LVEF40-45% in 2018, 35% in 11/2019 and 07/2020, at 40-45% by echo in 09/2021), CAD (moderate disease by catheterization in 2002, NST in 2017 showing possible mild anterior septal and apical ischemia and medical management pursued),cardiomyopathy, PAF on Eliquis, HTN, LBBB, IIDM,  DM neuropathy, presented with worsening of generalized weakness and lightheadedness  Patient torsemide dosage was adjusted recently increased dosage about 2 weeks ago to address worsening of peripheral edema. She lost 7-8 pound in last 2 weeks.she has been feeling very tired and lightheadedness but denies any fall, no chest pain or shortness of breath. She was at PCP office and noted to have low BP and sent to ED ED: Bp 91/53, heart rate 75, nonhypoxic.  Blood work showed AKI creatinine 2.2 compared to baseline 0.9 about 1 year ago,WBC 6.3 K4.8.  X-ray showed no acute infiltrates.  TSH 0.9 lactic acid 1.6. Patient was given IV bolus of 500 mL x 2, AND admitted for further management  Subjective: Seen this am On RA. Resting well No new complaints Ambulating BP still soft  Assessment and Plan: Principal Problem:   AKI (acute kidney injury) (HCC)   Orthostatic hypotension Near syncope Generalized weakness Deconditioning/debility: Suspect in the setting of orthostatic hypotension.  Taken off Flomax, antihypertensives diuretics and given IV fluid boluses BUT  blood pressure still soft keep on gentle IV hydration, added midodrine 5 mg 3 times daily. Continue PT OT.  Continue to hold CHF meds,  Chronic HFrEF: TTE shows "lvef 45%.The left ventricle has mildly decreased function. The left ventricle demonstrates regional wall motion abnormalities (see scoring diagram/findings for description).here is the interventricular septum is  flattened in systole and diastole,  consistent with right ventricular  pressure and volume overload. Abnormal septal motion related to intraventricular conduction delay.  Appreciate cardiology input on board-continue to hold Entresto, Aldactone Farxiga and torsemide.  Starting midodrine as above.  Net IO Since Admission: 1,480 mL [10/14/22 1249]   Severe MR- monitor volume status. HLD continue pravastatin Hypertension BP soft- meds on hold as above PAF continue Eliquis Diabetes mellitus holding [place]. Cont SSI gabapentin-A1c and blood sugar well-controlled Recent Labs  Lab 10/12/22 1847 10/13/22 0734 10/13/22 1129 10/13/22 1518 10/13/22 2104 10/14/22 0610  GLUCAP  --  86 88 94 97 94  HGBA1C 5.9*  --   --   --   --   --     Weight loss ?21 lb, since 02/2022- ?cardiac cachexia. RD consulted-augment diet as tolerated          Class I Obesity:Patient's Body mass index is 34.77 kg/m. : Will benefit with PCP follow-up, weight loss  healthy lifestyle and outpatient sleep evaluation.   DVT prophylaxis: apixaban (ELIQUIS) tablet 2.5 mg Start: 10/12/22 2200 Code Status:   Code Status: Full Code Family Communication: plan of care discussed with patient at bedside. Patient status is:  admitted as observation but remains hospitalized for ongoing  because of hypotension Level of care: Telemetry Medical   Dispo: The patient is from: HOME            Anticipated disposition: Discharge tomorrow blood pressure meds stable   Objective: Vitals last 24 hrs: Vitals:   10/14/22 0103 10/14/22 0158 10/14/22 0436 10/14/22 0914  BP: (!) 89/61 (!) 106/58 95/60 (!) 117/56  Pulse: 64 67 62 68  Resp: 18 18 18 18   Temp: (!) 97.5 F (  36.4 C) 97.7 F (36.5 C) 98.1 F (36.7 C) 98 F (36.7 C)  TempSrc: Oral Oral Oral Oral  SpO2: 98%  96% 95%  Weight: 89 kg     Height:       Weight change: -1.678 kg  Physical Examination: General exam:Alert awake,oriented at baseline,older than stated age. HEENT:Oral mucosa moist, Ear/Nose WNL  grossly. Respiratory system:Bilaterally clear BS,no use of accessory muscle. Cardiovascular system:S1 & S2 +, No JVD. Gastrointestinal system:Abdomen soft,NT,ND, BS+ Nervous System:Alert, awake, moving all extremities,and following commands. Extremities:LE edema neg,distal peripheral pulses palpable and warm.  Skin:No rashes,no icterus. YNW:GNFAOZ muscle bulk,tone, power.   Medications reviewed:  Scheduled Meds:  apixaban  2.5 mg Oral BID   gabapentin  100 mg Oral QHS   insulin aspart  0-9 Units Subcutaneous TID WC   metoprolol succinate  12.5 mg Oral Daily   midodrine  5 mg Oral TID WC   pantoprazole  40 mg Oral Daily   pravastatin  40 mg Oral QHS   sodium chloride flush  3 mL Intravenous Q12H  Continuous Infusions:  sodium chloride     sodium chloride 125 mL/hr at 10/14/22 3086    Diet Order             Diet heart healthy/carb modified Room service appropriate? Yes; Fluid consistency: Thin  Diet effective now                  Intake/Output Summary (Last 24 hours) at 10/14/2022 1155 Last data filed at 10/13/2022 1420 Gross per 24 hour  Intake 240 ml  Output --  Net 240 ml   Net IO Since Admission: 1,480 mL [10/14/22 1155]  Wt Readings from Last 3 Encounters:  10/14/22 89 kg  09/30/22 91.6 kg  09/18/22 92.5 kg    Unresulted Labs (From admission, onward)     Start     Ordered   10/12/22 1027  CBC with Differential  Once,   STAT        10/12/22 1026          Data Reviewed: I have personally reviewed following labs and imaging studies CBC: Recent Labs  Lab 10/12/22 1416  WBC 6.3  NEUTROABS 3.9  HGB 14.9  HCT 45.0  MCV 96.6  PLT 244   Basic Metabolic Panel: Recent Labs  Lab 10/12/22 1027 10/13/22 0034 10/14/22 0021  NA 134* 137 137  K 4.8 4.5 4.4  CL 100 106 107  CO2 24 24 24   GLUCOSE 95 93 101*  BUN 39* 34* 24*  CREATININE 2.27* 1.71* 1.67*  CALCIUM 10.5* 9.4 9.0   GFR: Estimated Creatinine Clearance: 28.4 mL/min (A) (by C-G formula based  on SCr of 1.67 mg/dL (H)). Liver Function Tests: Recent Labs  Lab 10/12/22 1027  AST 21  ALT 15  ALKPHOS 63  BILITOT 0.7  PROT 7.2  ALBUMIN 3.6    Recent Labs    10/12/22 1847  HGBA1C 5.9*   CBG: Recent Labs  Lab 10/13/22 0734 10/13/22 1129 10/13/22 1518 10/13/22 2104 10/14/22 0610  GLUCAP 86 88 94 97 94    Recent Labs    10/12/22 1422  TSH 0.996   Sepsis Labs: Recent Labs  Lab 10/12/22 1400  LATICACIDVEN 1.6   Recent Results (from the past 240 hour(s))  Culture, blood (routine x 2)     Status: None (Preliminary result)   Collection Time: 10/12/22 10:32 AM   Specimen: BLOOD  Result Value Ref Range Status   Specimen Description  BLOOD SITE NOT SPECIFIED  Final   Special Requests   Final    BOTTLES DRAWN AEROBIC AND ANAEROBIC Blood Culture results may not be optimal due to an inadequate volume of blood received in culture bottles   Culture   Final    NO GROWTH 2 DAYS Performed at [facility], [address] [place], [place] [address]    Report Status PENDING  Incomplete  Culture, blood (routine x 2)     Status: None (Preliminary result)   Collection Time: 10/12/22 11:00 AM   Specimen: BLOOD LEFT HAND  Result Value Ref Range Status   Specimen Description BLOOD LEFT HAND  Final   Special Requests   Final    BOTTLES DRAWN AEROBIC AND ANAEROBIC Blood Culture results may not be optimal due to an inadequate volume of blood received in culture bottles   Culture   Final    NO GROWTH 2 DAYS Performed at [facility], [address], [place], [place] [address]    Report Status PENDING  Incomplete    Antimicrobials: Anti-infectives (From admission, onward)    None      Culture/Microbiology    Component Value Date/Time   SDES BLOOD LEFT HAND 10/12/2022 1100   SPECREQUEST  10/12/2022 1100    BOTTLES DRAWN AEROBIC AND ANAEROBIC Blood Culture results may not be optimal due to an inadequate volume of blood received in culture bottles   CULT   10/12/2022 1100    NO GROWTH 2 DAYS Performed at [facility], [address], [place], [place] [address]    REPTSTATUS PENDING 10/12/2022 1100   Radiology Studies: ECHOCARDIOGRAM COMPLETE  Result Date: 10/13/2022    ECHOCARDIOGRAM REPORT   Patient Name:   CARROLE MCMOORE [facility] Date of Exam: 10/13/2022 Medical Rec #:  [id number]         Height:       63.0 in Accession #:    [id number]        Weight:       197.4 lb Date of Birth:  [birth year]         BSA:          1.923 m Patient Age:    82 years          BP:           112/57 mmHg Patient Gender: F                 HR:           58 bpm. Exam Location:  Inpatient Procedure: 2D Echo, Cardiac Doppler and Color Doppler Indications:    CHF - Systolic  History:        Patient has prior history of Echocardiogram examinations, most                 recent 09/18/2021. HFrEF and Cardiomyopathy, Arrythmias:Atrial                 Fibrillation and LBBB, Signs/Symptoms:Dizziness/Lightheadedness                 and Edema; Risk Factors:Hypertension and Diabetes.  Sonographer:    Milda Smart Referring Phys: [id number] PING T ZHANG IMPRESSIONS  1. Left ventricular ejection fraction, by estimation, is 45%. The left ventricle has mildly decreased function. The left ventricle demonstrates regional wall motion abnormalities (see scoring diagram/findings for description). Left ventricular diastolic  function could not be evaluated. There is the interventricular septum is flattened in systole  and diastole, consistent with right ventricular pressure and volume overload. Abnormal septal motion related to intraventricular conduction delay.  2. Right ventricular systolic function is normal. The right ventricular size is normal. There is mildly elevated pulmonary artery systolic pressure. The estimated right ventricular systolic pressure is 36.7 mmHg.  3. Left atrial size was severely dilated.  4. Right atrial size was mild to moderately dilated.  5. A small pericardial effusion is present.  The pericardial effusion is circumferential.  6. Central mitral regurgitation with blunting of the right sided pulmonary veins and suboptimal PISA shell. 2D MVA 6.09 cm2 with systolic and diastolic doming of the anterior mitral valve leaflet. The mitral valve is degenerative. Severe mitral valve regurgitation. No evidence of mitral stenosis. Moderate mitral annular calcification.  7. The tricuspid valve is abnormal. Tricuspid valve regurgitation is mild to moderate.  8. The aortic valve is tricuspid. Aortic valve regurgitation is not visualized. No aortic stenosis is present.  9. The inferior vena cava is dilated in size with <50% respiratory variability, suggesting right atrial pressure of 15 mmHg. Comparison(s): Prior images reviewed side by side. Mitral regurgitation is worse. FINDINGS  Left Ventricle: Left ventricular ejection fraction, by estimation, is 45%. The left ventricle has mildly decreased function. The left ventricle demonstrates regional wall motion abnormalities. The left ventricular internal cavity size was normal in size. There is no left ventricular hypertrophy. The interventricular septum is flattened in systole and diastole, consistent with right ventricular pressure and volume overload. Left ventricular diastolic function could not be evaluated due to mitral annular calcification (moderate or greater). Left ventricular diastolic function could not be evaluated.  LV Wall Scoring: The anterior septum, mid inferoseptal segment, and basal inferoseptal segment are hypokinetic. Abnormal septal motion related to intraventricular conduction delay. Right Ventricle: The right ventricular size is normal. No increase in right ventricular wall thickness. Right ventricular systolic function is normal. There is mildly elevated pulmonary artery systolic pressure. The tricuspid regurgitant velocity is 2.33  m/s, and with an assumed right atrial pressure of 15 mmHg, the estimated right ventricular systolic  pressure is 36.7 mmHg. Left Atrium: Left atrial size was severely dilated. Right Atrium: Right atrial size was mild to moderately dilated. Pericardium: A small pericardial effusion is present. The pericardial effusion is circumferential. Mitral Valve: Central mitral regurgitation with blunting of the right sided pulmonary veins and suboptimal PISA shell. 2D MVA 6.09 cm2 with systolic and diastolic doming of the anterior mitral valve leaflet. The mitral valve is degenerative in appearance. Moderate mitral annular calcification. Severe mitral valve regurgitation. No evidence of mitral valve stenosis. Tricuspid Valve: The tricuspid valve is abnormal. Tricuspid valve regurgitation is mild to moderate. Aortic Valve: The aortic valve is tricuspid. Aortic valve regurgitation is not visualized. No aortic stenosis is present. Pulmonic Valve: The pulmonic valve was normal in structure. Pulmonic valve regurgitation is mild. No evidence of pulmonic stenosis. Aorta: The aortic root and ascending aorta are structurally normal, with no evidence of dilitation. Venous: The inferior vena cava is dilated in size with less than 50% respiratory variability, suggesting right atrial pressure of 15 mmHg. IAS/Shunts: No atrial level shunt detected by color flow Doppler.  LEFT VENTRICLE PLAX 2D LVIDd:         4.10 cm LVIDs:         3.20 cm LV PW:         0.70 cm LV IVS:        0.70 cm LVOT diam:     1.90 cm LV  SV:         50 LV SV Index:   26 LVOT Area:     2.84 cm  LV Volumes (MOD) LV vol d, MOD A2C: 70.2 ml LV vol d, MOD A4C: 67.5 ml LV vol s, MOD A2C: 39.2 ml LV vol s, MOD A4C: 36.4 ml LV SV MOD A2C:     31.0 ml LV SV MOD A4C:     67.5 ml LV SV MOD BP:      30.8 ml RIGHT VENTRICLE          IVC RV Basal diam:  3.10 cm  IVC diam: 2.20 cm LEFT ATRIUM             Index        RIGHT ATRIUM           Index LA diam:        5.20 cm 2.70 cm/m   RA Area:     20.80 cm LA Vol (A2C):   61.3 ml 31.85 ml/m  RA Volume:   60.10 ml  31.25 ml/m LA Vol  (A4C):   79.0 ml 41.08 ml/m LA Biplane Vol: 62.5 ml 32.50 ml/m  AORTIC VALVE LVOT Vmax:   90.60 cm/s LVOT Vmean:  69.000 cm/s LVOT VTI:    0.177 m  AORTA Ao Root diam: 2.80 cm Ao Asc diam:  3.70 cm MR Peak grad:    92.0 mmHg    TRICUSPID VALVE MR Mean grad:    61.0 mmHg    TR Peak grad:   21.7 mmHg MR Vmax:         479.50 cm/s  TR Mean grad:   16.0 mmHg MR Vmean:        372.0 cm/s   TR Vmax:        233.00 cm/s MR PISA:         1.57 cm     TR Vmean:       193.0 cm/s MR PISA Eff ROA: 11 mm MR PISA Radius:  0.50 cm      SHUNTS                               Systemic VTI:  0.18 m                               Systemic Diam: 1.90 cm Riley Lam MD Electronically signed by Riley Lam MD Signature Date/Time: 10/13/2022/9:27:28 AM    Final      LOS: 1 day   Lanae Boast, MD [facility]  10/14/2022, 11:55 AM

## 2022-10-14 NOTE — Progress Notes (Signed)
Progress Note  Patient Name: [name] Date of Encounter: 10/14/2022  Primary Cardiologist: [name], MD   Subjective   Patient was seen and examined at her bedside. She was awake - no complaints.  Inpatient Medications    Scheduled Meds:  apixaban  2.5 mg Oral BID   gabapentin  100 mg Oral QHS   insulin aspart  0-9 Units Subcutaneous TID WC   metoprolol succinate  12.5 mg Oral Daily   pantoprazole  40 mg Oral Daily   pravastatin  40 mg Oral QHS   sodium chloride flush  3 mL Intravenous Q12H   Continuous Infusions:  sodium chloride     sodium chloride 125 mL/hr at 10/14/22 0735   PRN Meds: sodium chloride, acetaminophen, HYDROcodone-acetaminophen, ondansetron (ZOFRAN) IV, sodium chloride flush   Vital Signs    Vitals:   10/14/22 0103 10/14/22 0158 10/14/22 0436 10/14/22 0914  BP: (!) 89/61 (!) 106/58 95/60 (!) 117/56  Pulse: 64 67 62 68  Resp: 18 18 18 18   Temp: (!) 97.5 F (36.4 C) 97.7 F (36.5 C) 98.1 F (36.7 C) 98 F (36.7 C)  TempSrc: Oral Oral Oral Oral  SpO2: 98%  96% 95%  Weight: 89 kg     Height:        Intake/Output Summary (Last 24 hours) at 10/14/2022 0919 Last data filed at 10/13/2022 1420 Gross per 24 hour  Intake 240 ml  Output --  Net 240 ml   Filed Weights   10/12/22 1017 10/13/22 0437 10/14/22 0103  Weight: 90.7 kg 89.5 kg 89 kg    Telemetry    Afib with controlled rate - Personally Reviewed  ECG    None  - Personally Reviewed  Physical Exam    General: Comfortable Head: Atraumatic, normal size  Eyes: PEERLA, EOMI  Neck: Supple, normal JVD Cardiac: Normal S1, S2; RRR; no murmurs, rubs, or gallops Lungs: Clear to auscultation bilaterally Abd: Soft, nontender, no hepatomegaly  Ext: warm, no edema Musculoskeletal: No deformities, BUE and BLE strength normal and equal Skin: Warm and dry, no rashes   Neuro: Alert and oriented to person, place, time, and situation, CNII-XII grossly intact, no focal deficits   Psych: Normal mood and affect   Labs    Chemistry Recent Labs  Lab 10/12/22 1027 10/13/22 0034 10/14/22 0021  NA 134* 137 137  K 4.8 4.5 4.4  CL 100 106 107  CO2 24 24 24   GLUCOSE 95 93 101*  BUN 39* 34* 24*  CREATININE 2.27* 1.71* 1.67*  CALCIUM 10.5* 9.4 9.0  PROT 7.2  --   --   ALBUMIN 3.6  --   --   AST 21  --   --   ALT 15  --   --   ALKPHOS 63  --   --   BILITOT 0.7  --   --   GFRNONAA 21* 30* 31*  ANIONGAP 10 7 6      Hematology Recent Labs  Lab 10/12/22 1416  WBC 6.3  RBC 4.66  HGB 14.9  HCT 45.0  MCV 96.6  MCH 32.0  MCHC 33.1  RDW 12.5  PLT 244    Cardiac EnzymesNo results for input(s): "TROPONINI" in the last 168 hours. No results for input(s): "TROPIPOC" in the last 168 hours.   BNPNo results for input(s): "BNP", "PROBNP" in the last 168 hours.   DDimer No results for input(s): "DDIMER" in the last 168 hours.   Radiology    ECHOCARDIOGRAM  COMPLETE  Result Date: 10/13/2022    ECHOCARDIOGRAM REPORT   Patient Name:   [name] [facility] Date of Exam: 10/13/2022 Medical Rec #:  [id number]         Height:       63.0 in Accession #:    [id number]        Weight:       197.4 lb Date of Birth:  November 07, 1940         BSA:          1.923 m Patient Age:    82 years          BP:           112/57 mmHg Patient Gender: F                 HR:           58 bpm. Exam Location:  Inpatient Procedure: 2D Echo, Cardiac Doppler and Color Doppler Indications:    CHF - Systolic  History:        Patient has prior history of Echocardiogram examinations, most                 recent 09/18/2021. HFrEF and Cardiomyopathy, Arrythmias:Atrial                 Fibrillation and LBBB, Signs/Symptoms:Dizziness/Lightheadedness                 and Edema; Risk Factors:Hypertension and Diabetes.  Sonographer:    Milda Smart Referring Phys: [id number] PING T ZHANG IMPRESSIONS  1. Left ventricular ejection fraction, by estimation, is 45%. The left ventricle has mildly decreased function. The left ventricle  demonstrates regional wall motion abnormalities (see scoring diagram/findings for description). Left ventricular diastolic  function could not be evaluated. There is the interventricular septum is flattened in systole and diastole, consistent with right ventricular pressure and volume overload. Abnormal septal motion related to intraventricular conduction delay.  2. Right ventricular systolic function is normal. The right ventricular size is normal. There is mildly elevated pulmonary artery systolic pressure. The estimated right ventricular systolic pressure is 36.7 mmHg.  3. Left atrial size was severely dilated.  4. Right atrial size was mild to moderately dilated.  5. A small pericardial effusion is present. The pericardial effusion is circumferential.  6. Central mitral regurgitation with blunting of the right sided pulmonary veins and suboptimal PISA shell. 2D MVA 6.09 cm2 with systolic and diastolic doming of the anterior mitral valve leaflet. The mitral valve is degenerative. Severe mitral valve regurgitation. No evidence of mitral stenosis. Moderate mitral annular calcification.  7. The tricuspid valve is abnormal. Tricuspid valve regurgitation is mild to moderate.  8. The aortic valve is tricuspid. Aortic valve regurgitation is not visualized. No aortic stenosis is present.  9. The inferior vena cava is dilated in size with <50% respiratory variability, suggesting right atrial pressure of 15 mmHg. Comparison(s): Prior images reviewed side by side. Mitral regurgitation is worse. FINDINGS  Left Ventricle: Left ventricular ejection fraction, by estimation, is 45%. The left ventricle has mildly decreased function. The left ventricle demonstrates regional wall motion abnormalities. The left ventricular internal cavity size was normal in size. There is no left ventricular hypertrophy. The interventricular septum is flattened in systole and diastole, consistent with right ventricular pressure and volume overload.  Left ventricular diastolic function could not be evaluated due to mitral annular calcification (moderate or greater). Left ventricular diastolic function could not be evaluated.  LV Wall Scoring: The  anterior septum, mid inferoseptal segment, and basal inferoseptal segment are hypokinetic. Abnormal septal motion related to intraventricular conduction delay. Right Ventricle: The right ventricular size is normal. No increase in right ventricular wall thickness. Right ventricular systolic function is normal. There is mildly elevated pulmonary artery systolic pressure. The tricuspid regurgitant velocity is 2.33  m/s, and with an assumed right atrial pressure of 15 mmHg, the estimated right ventricular systolic pressure is 36.7 mmHg. Left Atrium: Left atrial size was severely dilated. Right Atrium: Right atrial size was mild to moderately dilated. Pericardium: A small pericardial effusion is present. The pericardial effusion is circumferential. Mitral Valve: Central mitral regurgitation with blunting of the right sided pulmonary veins and suboptimal PISA shell. 2D MVA 6.09 cm2 with systolic and diastolic doming of the anterior mitral valve leaflet. The mitral valve is degenerative in appearance. Moderate mitral annular calcification. Severe mitral valve regurgitation. No evidence of mitral valve stenosis. Tricuspid Valve: The tricuspid valve is abnormal. Tricuspid valve regurgitation is mild to moderate. Aortic Valve: The aortic valve is tricuspid. Aortic valve regurgitation is not visualized. No aortic stenosis is present. Pulmonic Valve: The pulmonic valve was normal in structure. Pulmonic valve regurgitation is mild. No evidence of pulmonic stenosis. Aorta: The aortic root and ascending aorta are structurally normal, with no evidence of dilitation. Venous: The inferior vena cava is dilated in size with less than 50% respiratory variability, suggesting right atrial pressure of 15 mmHg. IAS/Shunts: No atrial level shunt  detected by color flow Doppler.  LEFT VENTRICLE PLAX 2D LVIDd:         4.10 cm LVIDs:         3.20 cm LV PW:         0.70 cm LV IVS:        0.70 cm LVOT diam:     1.90 cm LV SV:         50 LV SV Index:   26 LVOT Area:     2.84 cm  LV Volumes (MOD) LV vol d, MOD A2C: 70.2 ml LV vol d, MOD A4C: 67.5 ml LV vol s, MOD A2C: 39.2 ml LV vol s, MOD A4C: 36.4 ml LV SV MOD A2C:     31.0 ml LV SV MOD A4C:     67.5 ml LV SV MOD BP:      30.8 ml RIGHT VENTRICLE          IVC RV Basal diam:  3.10 cm  IVC diam: 2.20 cm LEFT ATRIUM             Index        RIGHT ATRIUM           Index LA diam:        5.20 cm 2.70 cm/m   RA Area:     20.80 cm LA Vol (A2C):   61.3 ml 31.85 ml/m  RA Volume:   60.10 ml  31.25 ml/m LA Vol (A4C):   79.0 ml 41.08 ml/m LA Biplane Vol: 62.5 ml 32.50 ml/m  AORTIC VALVE LVOT Vmax:   90.60 cm/s LVOT Vmean:  69.000 cm/s LVOT VTI:    0.177 m  AORTA Ao Root diam: 2.80 cm Ao Asc diam:  3.70 cm MR Peak grad:    92.0 mmHg    TRICUSPID VALVE MR Mean grad:    61.0 mmHg    TR Peak grad:   21.7 mmHg MR Vmax:         479.50 cm/s  TR Mean grad:  16.0 mmHg MR Vmean:        372.0 cm/s   TR Vmax:        233.00 cm/s MR PISA:         1.57 cm     TR Vmean:       193.0 cm/s MR PISA Eff ROA: 11 mm MR PISA Radius:  0.50 cm      SHUNTS                               Systemic VTI:  0.18 m                               Systemic Diam: 1.90 cm Riley Lam MD Electronically signed by Riley Lam MD Signature Date/Time: 10/13/2022/9:27:28 AM    Final    DG Chest Portable 1 View  Result Date: 10/12/2022 CLINICAL DATA:  Provided history: Weakness. EXAM: PORTABLE CHEST 1 VIEW COMPARISON:  Prior chest radiograph 07/03/2019 and earlier. FINDINGS: Heart size at the upper limits of normal. Aortic atherosclerosis. No appreciable airspace consolidation. Redemonstrated chronic calcified granuloma within the left lung apex, and calcified mediastinal/hilar lymph nodes. No evidence of pleural effusion or pneumothorax. No  acute osseous abnormality identified. Degenerative changes of the spine. IMPRESSION: 1. No evidence of an acute cardiopulmonary abnormality. 2. Stable chronic sequelae of granulomatous disease. 3. Aortic Atherosclerosis (ICD10-I70.0). Electronically Signed   By: Jackey Loge D.O.   On: 10/12/2022 11:35    Cardiac Studies   Echo 10/13/22 IMPRESSIONS   1. Left ventricular ejection fraction, by estimation, is 45%. The left  ventricle has mildly decreased function. The left ventricle demonstrates  regional wall motion abnormalities (see scoring diagram/findings for  description). Left ventricular diastolic   function could not be evaluated. There is the interventricular septum is  flattened in systole and diastole, consistent with right ventricular  pressure and volume overload. Abnormal septal motion related to  intraventricular conduction delay.   2. Right ventricular systolic function is normal. The right ventricular  size is normal. There is mildly elevated pulmonary artery systolic  pressure. The estimated right ventricular systolic pressure is 36.7 mmHg.   3. Left atrial size was severely dilated.   4. Right atrial size was mild to moderately dilated.   5. A small pericardial effusion is present. The pericardial effusion is  circumferential.   6. Central mitral regurgitation with blunting of the right sided  pulmonary veins and suboptimal PISA shell. 2D MVA 6.09 cm2 with systolic  and diastolic doming of the anterior mitral valve leaflet. The mitral  valve is degenerative. Severe mitral valve  regurgitation. No evidence of mitral stenosis. Moderate mitral annular  calcification.   7. The tricuspid valve is abnormal. Tricuspid valve regurgitation is mild  to moderate.   8. The aortic valve is tricuspid. Aortic valve regurgitation is not  visualized. No aortic stenosis is present.   9. The inferior vena cava is dilated in size with <50% respiratory  variability, suggesting right atrial  pressure of 15 mmHg.   Comparison(s): Prior images reviewed side by side. Mitral regurgitation is  worse.   FINDINGS   Left Ventricle: Left ventricular ejection fraction, by estimation, is  45%. The left ventricle has mildly decreased function. The left ventricle  demonstrates regional wall motion abnormalities. The left ventricular  internal cavity size was normal in  size. There is no left ventricular  hypertrophy. The interventricular  septum is flattened in systole and diastole, consistent with right  ventricular pressure and volume overload. Left ventricular diastolic  function could not be evaluated due to mitral  annular calcification (moderate or greater). Left ventricular diastolic  function could not be evaluated.     LV Wall Scoring:  The anterior septum, mid inferoseptal segment, and basal inferoseptal  segment  are hypokinetic. Abnormal septal motion related to intraventricular  conduction delay.   Right Ventricle: The right ventricular size is normal. No increase in  right ventricular wall thickness. Right ventricular systolic function is  normal. There is mildly elevated pulmonary artery systolic pressure. The  tricuspid regurgitant velocity is 2.33   m/s, and with an assumed right atrial pressure of 15 mmHg, the estimated  right ventricular systolic pressure is 36.7 mmHg.   Left Atrium: Left atrial size was severely dilated.   Right Atrium: Right atrial size was mild to moderately dilated.   Pericardium: A small pericardial effusion is present. The pericardial  effusion is circumferential.   Mitral Valve: Central mitral regurgitation with blunting of the right  sided pulmonary veins and suboptimal PISA shell. 2D MVA 6.09 cm2 with  systolic and diastolic doming of the anterior mitral valve leaflet. The  mitral valve is degenerative in  appearance. Moderate mitral annular calcification. Severe mitral valve  regurgitation. No evidence of mitral valve stenosis.    Tricuspid Valve: The tricuspid valve is abnormal. Tricuspid valve  regurgitation is mild to moderate.   Aortic Valve: The aortic valve is tricuspid. Aortic valve regurgitation is  not visualized. No aortic stenosis is present.   Pulmonic Valve: The pulmonic valve was normal in structure. Pulmonic valve  regurgitation is mild. No evidence of pulmonic stenosis.   Aorta: The aortic root and ascending aorta are structurally normal, with  no evidence of dilitation.   Venous: The inferior vena cava is dilated in size with less than 50%  respiratory variability, suggesting right atrial pressure of 15 mmHg.   IAS/Shunts: No atrial level shunt detected by color flow Doppler.   Patient Profile     82 y.o. female hx of HFrEF EF 40-45% in 2018, 35% in 11/2019 and 07/2020, at 40-45% by echo in 09/2021), mod MR, CAD (moderate disease by catheterization in 2002, NST in 2017 showing possible mild anterior septal and apical ischemia and medical management pursued), paroxysmal atrial fibrillation/flutter, LBBB, Stage 3 CKD, HTN, HLD and hypothyroidism  and severe mitral regurgitation.   Assessment & Plan    Orthostatic hypotension Acute on chronic heart failure with midrange ejection fraction EF 45% on echo done yesterday Severe mitral regurgitation Coronary artery disease Permanent atrial fibrillation Diabetes mellitus Unexplained Weight loss  Blood pressure is still borderline, she denies any symptoms at this time.  She will benefit from midodrine please start midodrine 5 mg 3 times daily. Continue to hold Entresto, spironolactone, Farxiga and torsemide.  She does not appear to be clinically decompensated or volume overloaded.  Will continue to hold off the diuretics.  No anginal symptoms.  Continue current pravastatin.  Not on aspirin due to Eliquis use.  She may benefit from out of bed activities.  Diabetes mellitus is being managed by the primary team.  Hx of atrial fibrillation  heart rate is controlled, transition to metoprolol to tartrate to metoprolol succinate today.     For questions or updates, please contact [facility] Please consult [email or web address] for contact info under Cardiology/STEMI.      Signed, Thomasene Ripple, DO  10/14/2022, 9:19 AM

## 2022-10-15 LAB — BASIC METABOLIC PANEL
Anion gap: 8 (ref 5–15)
BUN: 17 mg/dL (ref 8–23)
CO2: 21 mmol/L — ABNORMAL LOW (ref 22–32)
Calcium: 9.1 mg/dL (ref 8.9–10.3)
Chloride: 109 mmol/L (ref 98–111)
Creatinine, Ser: 1.35 mg/dL — ABNORMAL HIGH (ref 0.44–1.00)
GFR, Estimated: 40 mL/min — ABNORMAL LOW (ref >60)
Glucose, Bld: 110 mg/dL — ABNORMAL HIGH (ref 70–99)
Potassium: 4.9 mmol/L (ref 3.5–5.1)
Sodium: 138 mmol/L (ref 135–145)

## 2022-10-15 LAB — GLUCOSE, CAPILLARY
Glucose-Capillary: 100 mg/dL — ABNORMAL HIGH (ref 70–99)
Glucose-Capillary: 138 mg/dL — ABNORMAL HIGH (ref 70–99)

## 2022-10-15 MED ORDER — PANTOPRAZOLE SODIUM 40 MG PO TBEC: 40.0000 mg | DELAYED_RELEASE_TABLET | Freq: Every day | ORAL | 0 refills | Status: DC

## 2022-10-15 MED ORDER — MIDODRINE HCL 5 MG PO TABS: 5.0000 mg | ORAL_TABLET | Freq: Three times a day (TID) | ORAL | 0 refills | Status: DC

## 2022-10-15 MED ORDER — METOPROLOL SUCCINATE ER 25 MG PO TB24: 12.5000 mg | ORAL_TABLET | Freq: Every day | ORAL | 0 refills | Status: DC

## 2022-10-15 MED ORDER — TORSEMIDE 20 MG PO TABS: 10.0000 mg | ORAL_TABLET | ORAL | 0 refills | Status: DC

## 2022-10-15 NOTE — Progress Notes (Signed)
Rounding Note    Patient Name: [name] Date of Encounter: 10/15/2022  [facility] Cardiologist: [name], MD   Subjective   Feels well and wants to go home.   Inpatient Medications    Scheduled Meds:  apixaban  2.5 mg Oral BID   feeding supplement  1 Container Oral TID BM   gabapentin  100 mg Oral QHS   insulin aspart  0-9 Units Subcutaneous TID WC   metoprolol succinate  12.5 mg Oral Daily   midodrine  5 mg Oral TID WC   multivitamin with minerals  1 tablet Oral Daily   pantoprazole  40 mg Oral Daily   pravastatin  40 mg Oral QHS   sodium chloride flush  3 mL Intravenous Q12H   Continuous Infusions:  sodium chloride     PRN Meds: sodium chloride, acetaminophen, HYDROcodone-acetaminophen, ondansetron (ZOFRAN) IV, sodium chloride flush   Vital Signs    Vitals:   10/15/22 0014 10/15/22 0415 10/15/22 0500 10/15/22 0854  BP: (!) 123/97 108/68  108/85  Pulse: 72 64  93  Resp: 18 18    Temp: 97.6 F (36.4 C) 97.6 F (36.4 C)    TempSrc: Oral Oral    SpO2: 99% 98%    Weight: 90.2 kg  90.2 kg   Height:        Intake/Output Summary (Last 24 hours) at 10/15/2022 0909 Last data filed at 10/15/2022 0515 Gross per 24 hour  Intake 595 ml  Output 1700 ml  Net -1105 ml      10/15/2022    5:00 AM 10/15/2022   12:14 AM 10/14/2022    1:03 AM  Last 3 Weights  Weight (lbs) 198 lb 12.8 oz 198 lb 12.8 oz 196 lb 4.8 oz  Weight (kg) 90.175 kg 90.175 kg 89.041 kg      Telemetry    Afib, occasional elevated rates, rate controlled in the room today, LBBB - Personally Reviewed  ECG    7/1 Afib IVCD - Personally Reviewed  Physical Exam   GEN: No acute distress.   Neck: No JVD Cardiac: RRR, 2/6 systolic axillary murmur Respiratory: Clear to auscultation bilaterally. GI: Soft, nontender, non-distended  MS: No edema; No deformity. Neuro:  Nonfocal  Psych: Normal affect   Labs    High Sensitivity Troponin:  No results for input(s):  "TROPONINIHS" in the last 720 hours.   Chemistry Recent Labs  Lab 10/12/22 1027 10/13/22 0034 10/14/22 0021  NA 134* 137 137  K 4.8 4.5 4.4  CL 100 106 107  CO2 24 24 24   GLUCOSE 95 93 101*  BUN 39* 34* 24*  CREATININE 2.27* 1.71* 1.67*  CALCIUM 10.5* 9.4 9.0  PROT 7.2  --   --   ALBUMIN 3.6  --   --   AST 21  --   --   ALT 15  --   --   ALKPHOS 63  --   --   BILITOT 0.7  --   --   GFRNONAA 21* 30* 31*  ANIONGAP 10 7 6     Lipids No results for input(s): "CHOL", "TRIG", "HDL", "LABVLDL", "LDLCALC", "CHOLHDL" in the last 168 hours.  Hematology Recent Labs  Lab 10/12/22 1416  WBC 6.3  RBC 4.66  HGB 14.9  HCT 45.0  MCV 96.6  MCH 32.0  MCHC 33.1  RDW 12.5  PLT 244   Thyroid  Recent Labs  Lab 10/12/22 1422  TSH 0.996    BNPNo results  for input(s): "BNP", "PROBNP" in the last 168 hours.  DDimer No results for input(s): "DDIMER" in the last 168 hours.   Radiology    Cardiac Studies   Echo independently reviewed.  EF 40-45%. Abnormal septal motion due to conduction delay RV mild-moderately reduced function, grossly normal size Severe biatrial enlargement Severe MR Moderate TR RA pressure 15 mmHg.   Patient Profile     82 y.o. female with HFrEF and afib, admitted for hypotension and AKI, volume resuscitated and AKI resolving. Protracted hypotension requiring holding HF therapy.   Assessment & Plan    Principal Problem:   AKI (acute kidney injury) (HCC)  Hypotension/AKI/dehydration - seems related to diuresis per prior team.   Acute on chronic HFrEF recently with need to increase torsemide, patient does not recall this. - torsemide currently on hold, AKI improving. Cr 1.35 -HF therapy on hold.  - Consider resuming torsemide tomorrow at home dose.  - may be able to resume HF therapy at follow up.  - may not need scheduled midodrine homegoing can consider PRN, patient asymptomatic.   MR -echo independently reviewed, MR severe. Mechanism may be  mixed atrial functional and degenerative MV. Central jet speaks to atrial functional.   4. Afib Continue eliquis, dose reduced for renal function, consider reassessing with pharmacist at dc.  - cont toprol xl 12.5 mg  For questions or updates, please contact  [facility] Please consult [email or web address] for contact info under        Signed, Parke Poisson, MD  10/15/2022, 9:09 AM

## 2022-10-15 NOTE — Discharge Summary (Signed)
Physician Discharge Summary  [name] OZH:[id number] DOB: [birth year] DOA: 10/12/2022  PCP: [name], MD  Admit date: 10/12/2022 Discharge date: 10/15/2022  Admitted From: Home Disposition: Home  Recommendations for Outpatient Follow-up:  Follow up with PCP in 1-2 weeks Follow-up with cardiology in 1 to 2 weeks as scheduled -will need to reevaluate patient's ability to tolerate Entresto and diuretics given below  Home Health: None Equipment/Devices: None  Discharge Condition: Stable CODE STATUS: Full Diet recommendation: Low-salt low-fat diet  Brief/Interim Summary: 82 y.o.f w/ chronic HFrEF (LVEF40-45% in 2018, 35% in 11/2019 and 07/2020, at 40-45% by echo in 09/2021), CAD (moderate disease by catheterization in 2002, NST in 2017 showing possible mild anterior septal and apical ischemia and medical management pursued),cardiomyopathy, PAF on Eliquis, HTN, LBBB, IIDM, DM neuropathy, presented with worsening of generalized weakness and lightheadedness .  Patient found to have profound orthostatic hypotension with episodes of near syncope and generalized weakness.  Concern for polypharmacy as well as poor p.o. intake/malnutrition/dehydration given patient's advanced age and multiple medications and reportedly poor p.o. intake.  At this time patient has improved on midodrine but is not able to tolerate Entresto or isosorbide or spironolactone which were her primary core measures prior to admission.  At this time she remains on low-dose metoprolol.  Patient had transient episode of AKI, appropriately was transitioned from 5 mg Eliquis to 2.5 but given resolution of AKI she has been transitioned back to full dose.  Repeat labs in the next 1 to 2 weeks to ensure stability of renal function and volume status to further evaluate possible reinitiation of Entresto, spironolactone, or other possible diuretics.  Discharge Diagnoses:  Principal Problem:   AKI (acute kidney injury)  (HCC)   Orthostatic hypotension Near syncope Generalized weakness Deconditioning/debility: Suspect in the setting of orthostatic hypotension. Improved with medication changes as below   Chronic HFrEF: EF 45%.The left ventricle has mildly decreased function. Continue midodrine, evaluate outpatient ability to reinitiate core measures as below   Severe MR-stable. HLD continue pravastatin Hypertension continue low-dose metoprolol for rate control, on midodrine - otherwise all other BP meds discontinued PAF continue Eliquis 5 twice daily Diabetes mellitus, controlled, non-insulin dependent-resume Farxiga; A1c 5.9   Discharge Instructions   Allergies as of 10/15/2022       Reactions   Codeine Itching   "feel like going buggy"  Itchy skin   Tape Itching   Nylon tape makes pt feel itchy        Medication List     STOP taking these medications    Entresto 49-51 MG Generic drug: sacubitril-valsartan   isosorbide mononitrate 30 MG 24 hr tablet Commonly known as: IMDUR   omeprazole 20 MG capsule Commonly known as: PRILOSEC Replaced by: pantoprazole 40 MG tablet   spironolactone 25 MG tablet Commonly known as: ALDACTONE       TAKE these medications    clindamycin 150 MG capsule Commonly known as: CLEOCIN Take 150 mg by mouth 2 (two) times daily.   Eliquis 5 MG Tabs tablet Generic drug: apixaban Take 1 tablet by mouth twice daily What changed: how much to take   Farxiga 10 MG Tabs tablet Generic drug: dapagliflozin propanediol Take 1 tablet by mouth every day   gabapentin 100 MG capsule Commonly known as: NEURONTIN Take 100 mg by mouth at bedtime.   HYDROcodone-acetaminophen 5-325 MG tablet Commonly known as: NORCO/VICODIN Take 1 tablet by mouth every 6 (six) hours as needed for moderate pain.   metoprolol succinate 25  MG 24 hr tablet Commonly known as: TOPROL-XL Take 0.5 tablets (12.5 mg total) by mouth daily. Start taking on: October 16, 2022 What  changed:  medication strength See the new instructions.   midodrine 5 MG tablet Commonly known as: PROAMATINE Take 1 tablet (5 mg total) by mouth 3 (three) times daily with meals.   multivitamin with minerals Tabs tablet Take 1 tablet by mouth daily.   nitroGLYCERIN 0.4 MG SL tablet Commonly known as: NITROSTAT Place 1 tablet (0.4 mg total) under the tongue every 5 (five) minutes as needed. For chest pain What changed: reasons to take this   pantoprazole 40 MG tablet Commonly known as: PROTONIX Take 1 tablet (40 mg total) by mouth daily. Start taking on: October 16, 2022 Replaces: omeprazole 20 MG capsule   pravastatin 40 MG tablet Commonly known as: PRAVACHOL Take 40 mg by mouth at bedtime.   tamsulosin 0.4 MG Caps capsule Commonly known as: FLOMAX Take 1 capsule (0.4 mg total) by mouth daily.   torsemide 20 MG tablet Commonly known as: DEMADEX Take 0.5-1 tablets (10-20 mg total) by mouth See admin instructions. Takes 20 mg in the morning and 10 mg at night Start taking on: October 17, 2022 What changed: These instructions start on October 17, 2022. If you are unsure what to do until then, ask your doctor or other care provider.        Allergies  Allergen Reactions   Codeine Itching    "feel like going buggy"  Itchy skin   Tape Itching    Nylon tape makes pt feel itchy    Consultations: Cardiology  Procedures/Studies: ECHOCARDIOGRAM COMPLETE  Result Date: 10/13/2022    ECHOCARDIOGRAM REPORT   Patient Name:   [name] [facility] Date of Exam: 10/13/2022 Medical Rec #:  [id number]         Height:       63.0 in Accession #:    [id number]        Weight:       197.4 lb Date of Birth:  [birth year]         BSA:          1.923 m Patient Age:    82 years          BP:           112/57 mmHg Patient Gender: F                 HR:           58 bpm. Exam Location:  Inpatient Procedure: 2D Echo, Cardiac Doppler and Color Doppler Indications:    CHF - Systolic  History:        Patient has prior  history of Echocardiogram examinations, most                 recent 09/18/2021. HFrEF and Cardiomyopathy, Arrythmias:Atrial                 Fibrillation and LBBB, Signs/Symptoms:Dizziness/Lightheadedness                 and Edema; Risk Factors:Hypertension and Diabetes.  Sonographer:    Milda Smart Referring Phys: [id number] PING T ZHANG IMPRESSIONS  1. Left ventricular ejection fraction, by estimation, is 45%. The left ventricle has mildly decreased function. The left ventricle demonstrates regional wall motion abnormalities (see scoring diagram/findings for description). Left ventricular diastolic  function could not be evaluated. There is the interventricular septum is flattened in systole and diastole, consistent  with right ventricular pressure and volume overload. Abnormal septal motion related to intraventricular conduction delay.  2. Right ventricular systolic function is normal. The right ventricular size is normal. There is mildly elevated pulmonary artery systolic pressure. The estimated right ventricular systolic pressure is 36.7 mmHg.  3. Left atrial size was severely dilated.  4. Right atrial size was mild to moderately dilated.  5. A small pericardial effusion is present. The pericardial effusion is circumferential.  6. Central mitral regurgitation with blunting of the right sided pulmonary veins and suboptimal PISA shell. 2D MVA 6.09 cm2 with systolic and diastolic doming of the anterior mitral valve leaflet. The mitral valve is degenerative. Severe mitral valve regurgitation. No evidence of mitral stenosis. Moderate mitral annular calcification.  7. The tricuspid valve is abnormal. Tricuspid valve regurgitation is mild to moderate.  8. The aortic valve is tricuspid. Aortic valve regurgitation is not visualized. No aortic stenosis is present.  9. The inferior vena cava is dilated in size with <50% respiratory variability, suggesting right atrial pressure of 15 mmHg. Comparison(s): Prior images reviewed  side by side. Mitral regurgitation is worse. FINDINGS  Left Ventricle: Left ventricular ejection fraction, by estimation, is 45%. The left ventricle has mildly decreased function. The left ventricle demonstrates regional wall motion abnormalities. The left ventricular internal cavity size was normal in size. There is no left ventricular hypertrophy. The interventricular septum is flattened in systole and diastole, consistent with right ventricular pressure and volume overload. Left ventricular diastolic function could not be evaluated due to mitral annular calcification (moderate or greater). Left ventricular diastolic function could not be evaluated.  LV Wall Scoring: The anterior septum, mid inferoseptal segment, and basal inferoseptal segment are hypokinetic. Abnormal septal motion related to intraventricular conduction delay. Right Ventricle: The right ventricular size is normal. No increase in right ventricular wall thickness. Right ventricular systolic function is normal. There is mildly elevated pulmonary artery systolic pressure. The tricuspid regurgitant velocity is 2.33  m/s, and with an assumed right atrial pressure of 15 mmHg, the estimated right ventricular systolic pressure is 36.7 mmHg. Left Atrium: Left atrial size was severely dilated. Right Atrium: Right atrial size was mild to moderately dilated. Pericardium: A small pericardial effusion is present. The pericardial effusion is circumferential. Mitral Valve: Central mitral regurgitation with blunting of the right sided pulmonary veins and suboptimal PISA shell. 2D MVA 6.09 cm2 with systolic and diastolic doming of the anterior mitral valve leaflet. The mitral valve is degenerative in appearance. Moderate mitral annular calcification. Severe mitral valve regurgitation. No evidence of mitral valve stenosis. Tricuspid Valve: The tricuspid valve is abnormal. Tricuspid valve regurgitation is mild to moderate. Aortic Valve: The aortic valve is tricuspid.  Aortic valve regurgitation is not visualized. No aortic stenosis is present. Pulmonic Valve: The pulmonic valve was normal in structure. Pulmonic valve regurgitation is mild. No evidence of pulmonic stenosis. Aorta: The aortic root and ascending aorta are structurally normal, with no evidence of dilitation. Venous: The inferior vena cava is dilated in size with less than 50% respiratory variability, suggesting right atrial pressure of 15 mmHg. IAS/Shunts: No atrial level shunt detected by color flow Doppler.  LEFT VENTRICLE PLAX 2D LVIDd:         4.10 cm LVIDs:         3.20 cm LV PW:         0.70 cm LV IVS:        0.70 cm LVOT diam:     1.90 cm LV SV:  50 LV SV Index:   26 LVOT Area:     2.84 cm  LV Volumes (MOD) LV vol d, MOD A2C: 70.2 ml LV vol d, MOD A4C: 67.5 ml LV vol s, MOD A2C: 39.2 ml LV vol s, MOD A4C: 36.4 ml LV SV MOD A2C:     31.0 ml LV SV MOD A4C:     67.5 ml LV SV MOD BP:      30.8 ml RIGHT VENTRICLE          IVC RV Basal diam:  3.10 cm  IVC diam: 2.20 cm LEFT ATRIUM             Index        RIGHT ATRIUM           Index LA diam:        5.20 cm 2.70 cm/m   RA Area:     20.80 cm LA Vol (A2C):   61.3 ml 31.85 ml/m  RA Volume:   60.10 ml  31.25 ml/m LA Vol (A4C):   79.0 ml 41.08 ml/m LA Biplane Vol: 62.5 ml 32.50 ml/m  AORTIC VALVE LVOT Vmax:   90.60 cm/s LVOT Vmean:  69.000 cm/s LVOT VTI:    0.177 m  AORTA Ao Root diam: 2.80 cm Ao Asc diam:  3.70 cm MR Peak grad:    92.0 mmHg    TRICUSPID VALVE MR Mean grad:    61.0 mmHg    TR Peak grad:   21.7 mmHg MR Vmax:         479.50 cm/s  TR Mean grad:   16.0 mmHg MR Vmean:        372.0 cm/s   TR Vmax:        233.00 cm/s MR PISA:         1.57 cm     TR Vmean:       193.0 cm/s MR PISA Eff ROA: 11 mm MR PISA Radius:  0.50 cm      SHUNTS                               Systemic VTI:  0.18 m                               Systemic Diam: 1.90 cm Riley Lam MD Electronically signed by Riley Lam MD Signature Date/Time: 10/13/2022/9:27:28 AM     Final    DG Chest Portable 1 View  Result Date: 10/12/2022 CLINICAL DATA:  Provided history: Weakness. EXAM: PORTABLE CHEST 1 VIEW COMPARISON:  Prior chest radiograph 07/03/2019 and earlier. FINDINGS: Heart size at the upper limits of normal. Aortic atherosclerosis. No appreciable airspace consolidation. Redemonstrated chronic calcified granuloma within the left lung apex, and calcified mediastinal/hilar lymph nodes. No evidence of pleural effusion or pneumothorax. No acute osseous abnormality identified. Degenerative changes of the spine. IMPRESSION: 1. No evidence of an acute cardiopulmonary abnormality. 2. Stable chronic sequelae of granulomatous disease. 3. Aortic Atherosclerosis (ICD10-I70.0). Electronically Signed   By: Jackey Loge D.O.   On: 10/12/2022 11:35     Subjective: No acute issues or events overnight, denies nausea vomiting diarrhea constipation any fevers chills chest pain   Discharge Exam: Vitals:   10/15/22 0415 10/15/22 0854  BP: 108/68 108/85  Pulse: 64 93  Resp: 18   Temp: 97.6 F (36.4 C)   SpO2: 98%  Vitals:   10/15/22 0014 10/15/22 0415 10/15/22 0500 10/15/22 0854  BP: (!) 123/97 108/68  108/85  Pulse: 72 64  93  Resp: 18 18    Temp: 97.6 F (36.4 C) 97.6 F (36.4 C)    TempSrc: Oral Oral    SpO2: 99% 98%    Weight: 90.2 kg  90.2 kg   Height:        General: Pt is alert, awake, not in acute distress Cardiovascular: RRR, S1/S2 +, no rubs, no gallops Respiratory: CTA bilaterally, no wheezing, no rhonchi Abdominal: Soft, NT, ND, bowel sounds + Extremities: no edema, no cyanosis    The results of significant diagnostics from this hospitalization (including imaging, microbiology, ancillary and laboratory) are listed below for reference.     Microbiology: Recent Results (from the past 240 hour(s))  Culture, blood (routine x 2)     Status: None (Preliminary result)   Collection Time: 10/12/22 10:32 AM   Specimen: BLOOD  Result Value Ref Range  Status   Specimen Description BLOOD SITE NOT SPECIFIED  Final   Special Requests   Final    BOTTLES DRAWN AEROBIC AND ANAEROBIC Blood Culture results may not be optimal due to an inadequate volume of blood received in culture bottles   Culture   Final    NO GROWTH 3 DAYS Performed at [facility], [address], [place], [place] [address]    Report Status PENDING  Incomplete  Culture, blood (routine x 2)     Status: None (Preliminary result)   Collection Time: 10/12/22 11:00 AM   Specimen: BLOOD LEFT HAND  Result Value Ref Range Status   Specimen Description BLOOD LEFT HAND  Final   Special Requests   Final    BOTTLES DRAWN AEROBIC AND ANAEROBIC Blood Culture results may not be optimal due to an inadequate volume of blood received in culture bottles   Culture   Final    NO GROWTH 3 DAYS Performed at [facility], [address], [place], [place] [address]    Report Status PENDING  Incomplete     Labs: BNP (last 3 results) No results for input(s): "BNP" in the last 8760 hours. Basic Metabolic Panel: Recent Labs  Lab 10/12/22 1027 10/13/22 0034 10/14/22 0021 10/15/22 0901  NA 134* 137 137 138  K 4.8 4.5 4.4 4.9  CL 100 106 107 109  CO2 24 24 24  21*  GLUCOSE 95 93 101* 110*  BUN 39* 34* 24* 17  CREATININE 2.27* 1.71* 1.67* 1.35*  CALCIUM 10.5* 9.4 9.0 9.1   Liver Function Tests: Recent Labs  Lab 10/12/22 1027  AST 21  ALT 15  ALKPHOS 63  BILITOT 0.7  PROT 7.2  ALBUMIN 3.6   No results for input(s): "LIPASE", "AMYLASE" in the last 168 hours. No results for input(s): "AMMONIA" in the last 168 hours. CBC: Recent Labs  Lab 10/12/22 1416  WBC 6.3  NEUTROABS 3.9  HGB 14.9  HCT 45.0  MCV 96.6  PLT 244   Cardiac Enzymes: No results for input(s): "CKTOTAL", "CKMB", "CKMBINDEX", "TROPONINI" in the last 168 hours. BNP: Invalid input(s): "POCBNP" CBG: Recent Labs  Lab 10/14/22 1213 10/14/22 1625 10/14/22 2108 10/15/22 0603 10/15/22 1158   GLUCAP 113* 94 98 100* 138*   D-Dimer No results for input(s): "DDIMER" in the last 72 hours. Hgb A1c Recent Labs    10/12/22 1847  HGBA1C 5.9*   Lipid Profile No results for input(s): "CHOL", "HDL", "LDLCALC", "TRIG", "CHOLHDL", "  LDLDIRECT" in the last 72 hours. Thyroid function studies Recent Labs    10/12/22 1422  TSH 0.996   Anemia work up No results for input(s): "VITAMINB12", "FOLATE", "FERRITIN", "TIBC", "IRON", "RETICCTPCT" in the last 72 hours. Urinalysis    Component Value Date/Time   COLORURINE YELLOW 02/25/2016 0810   APPEARANCEUR HAZY (A) 02/25/2016 0810   LABSPEC 1.020 02/25/2016 0810   PHURINE 6.0 02/25/2016 0810   GLUCOSEU NEGATIVE 02/25/2016 0810   HGBUR SMALL (A) 02/25/2016 0810   BILIRUBINUR NEGATIVE 02/25/2016 0810   KETONESUR NEGATIVE 02/25/2016 0810   PROTEINUR NEGATIVE 02/25/2016 0810   UROBILINOGEN 1.0 07/06/2013 1425   NITRITE NEGATIVE 02/25/2016 0810   LEUKOCYTESUR LARGE (A) 02/25/2016 0810   Sepsis Labs Recent Labs  Lab 10/12/22 1416  WBC 6.3   Microbiology Recent Results (from the past 240 hour(s))  Culture, blood (routine x 2)     Status: None (Preliminary result)   Collection Time: 10/12/22 10:32 AM   Specimen: BLOOD  Result Value Ref Range Status   Specimen Description BLOOD SITE NOT SPECIFIED  Final   Special Requests   Final    BOTTLES DRAWN AEROBIC AND ANAEROBIC Blood Culture results may not be optimal due to an inadequate volume of blood received in culture bottles   Culture   Final    NO GROWTH 3 DAYS Performed at [facility], [address], [place], [place] [address]    Report Status PENDING  Incomplete  Culture, blood (routine x 2)     Status: None (Preliminary result)   Collection Time: 10/12/22 11:00 AM   Specimen: BLOOD LEFT HAND  Result Value Ref Range Status   Specimen Description BLOOD LEFT HAND  Final   Special Requests   Final    BOTTLES DRAWN AEROBIC AND ANAEROBIC Blood Culture results may not be  optimal due to an inadequate volume of blood received in culture bottles   Culture   Final    NO GROWTH 3 DAYS Performed at [facility], [address] [place], [place] [address]    Report Status PENDING  Incomplete     Time coordinating discharge: Over 30 minutes  SIGNED:   Azucena Fallen, DO [facility] 10/15/2022, 12:07 PM Pager   If 7PM-7AM, please contact night-coverage [email or web address]

## 2022-10-16 LAB — CULTURE, BLOOD (ROUTINE X 2)

## 2022-10-17 LAB — CULTURE, BLOOD (ROUTINE X 2)
Culture: NO GROWTH
Culture: NO GROWTH

## 2022-10-20 DIAGNOSIS — E785 Hyperlipidemia, unspecified: Secondary | ICD-10-CM | POA: Diagnosis not present

## 2022-10-20 DIAGNOSIS — I2510 Atherosclerotic heart disease of native coronary artery without angina pectoris: Secondary | ICD-10-CM | POA: Diagnosis not present

## 2022-10-20 DIAGNOSIS — I480 Paroxysmal atrial fibrillation: Secondary | ICD-10-CM | POA: Diagnosis not present

## 2022-10-20 DIAGNOSIS — I951 Orthostatic hypotension: Secondary | ICD-10-CM | POA: Diagnosis not present

## 2022-10-20 DIAGNOSIS — Z7901 Long term (current) use of anticoagulants: Secondary | ICD-10-CM | POA: Diagnosis not present

## 2022-10-20 DIAGNOSIS — I5032 Chronic diastolic (congestive) heart failure: Secondary | ICD-10-CM | POA: Diagnosis not present

## 2022-10-20 DIAGNOSIS — I10 Essential (primary) hypertension: Secondary | ICD-10-CM | POA: Diagnosis not present

## 2022-10-28 ENCOUNTER — Encounter: Payer: Self-pay | Admitting: Student

## 2022-10-28 ENCOUNTER — Ambulatory Visit: Payer: 59 | Attending: Student | Admitting: Student

## 2022-10-28 ENCOUNTER — Encounter: Payer: Self-pay | Admitting: *Deleted

## 2022-10-28 VITALS — BP 130/86 | HR 76 | Ht 63.0 in | Wt 190.6 lb

## 2022-10-28 DIAGNOSIS — I5022 Chronic systolic (congestive) heart failure: Secondary | ICD-10-CM

## 2022-10-28 DIAGNOSIS — I2510 Atherosclerotic heart disease of native coronary artery without angina pectoris: Secondary | ICD-10-CM | POA: Diagnosis not present

## 2022-10-28 DIAGNOSIS — I480 Paroxysmal atrial fibrillation: Secondary | ICD-10-CM

## 2022-10-28 DIAGNOSIS — N1832 Chronic kidney disease, stage 3b: Secondary | ICD-10-CM

## 2022-10-28 DIAGNOSIS — I340 Nonrheumatic mitral (valve) insufficiency: Secondary | ICD-10-CM

## 2022-10-28 MED ORDER — MIDODRINE HCL 5 MG PO TABS: 5.0000 mg | ORAL_TABLET | Freq: Two times a day (BID) | ORAL | 3 refills | Status: DC

## 2022-10-28 NOTE — Patient Instructions (Signed)
Medication Instructions:  Your physician has recommended you make the following change in your medication:   Decrease Midodrine to 5 mg Two Times Daily   *If you need a refill on your cardiac medications before your next appointment, please call your pharmacy*   Lab Work: NONE   If you have labs (blood work) drawn today and your tests are completely normal, you will receive your results only by: MyChart Message (if you have MyChart) OR A paper copy in the mail If you have any lab test that is abnormal or we need to change your treatment, we will call you to review the results.   Testing/Procedures: Please record blood pressure on sheet provided and drop off at office in 2-3 weeks.    Follow-Up: At [facility], you and your health needs are our priority.  As part of our continuing mission to provide you with exceptional heart care, we have created designated Provider Care Teams.  These Care Teams include your primary Cardiologist (physician) and Advanced Practice Providers (APPs -  Physician Assistants and Nurse Practitioners) who all work together to provide you with the care you need, when you need it.  We recommend signing up for the patient portal called "MyChart".  Sign up information is provided on this After Visit Summary.  MyChart is used to connect with patients for Virtual Visits (Telemedicine).  Patients are able to view lab/test results, encounter notes, upcoming appointments, etc.  Non-urgent messages can be sent to your provider as well.   To learn more about what you can do with MyChart, go to [email or web address].    Your next appointment:   2 month(s)  Provider:   You may see [name], MD or one of the following Advanced Practice Providers on your designated Care Team:   Randall An, PA-C  Jacolyn Reedy, PA-C     Other Instructions Thank you for choosing [facility]!

## 2022-10-28 NOTE — Progress Notes (Signed)
Cardiology Office Note    Date:  10/28/2022  ID:  [name], DOB 1941-04-05, MRN [id number] Cardiologist: [name], MD    History of Present Illness:    [name] is a 82 y.o. 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   She was examined by Dr. Diona Browner in 09/2022 and reported NYHA class II dyspnea but her weight had declined by over 20 pounds since her last office visit. She was continued on her current cardiac medications with plans for a follow-up echocardiogram. In the interim, she was admitted to [facility] from 7/1 - 10/15/2022 for evaluation of worsening weakness and dizziness. She was found to have an AKI with creatinine elevated at 2.27 with Entresto, Spironolactone, Torsemide and Marcelline Deist being held. Was also found to be orthostatic and received over 3 L of fluid. Repeat echocardiogram during admission did show that her EF remained reduced at 45% with the anterior septum, mid inferior septal segment and basal inferior septal segment being hypokinetic. RV function was normal but she did have mildly elevated PASP at 36.7 mmHg along with a small pericardial effusion and her MR was noted to be in a severe range  Her creatinine had improved to 1.35 at the time of discharge and Entresto, Imdur and Spironolactone were held at discharge with her being continued on Eliquis 5 mg twice daily, Farxiga 10 mg daily, Toprol-XL 12.5 mg daily, Midodrine 5 mg 3 times daily and Torsemide 20 mg in AM/10 mg in PM.  t was recommended to consider restarting Entresto or Spironolactone at discharge  pending reassessment of her kidney function and BP.  In talking with the patient today, she reports she has been feeling weak since returning home but is trying to increase her activity level. Says she does not go outside frequently given the warm temperatures. She denies any specific chest pain or dyspnea on exertion. No specific orthopnea, PND or pitting edema. She has been checking her blood pressure at home and reports it has been well-controlled but is unsure of specific values when asked today. She remains on Eliquis for anticoagulation with no reports of active bleeding.  Studies Reviewed:   EKG: EKG is not ordered today.  Echocardiogram: 10/2022 IMPRESSIONS     1. Left ventricular ejection fraction, by estimation, is 45%. The left  ventricle has mildly decreased function. The left ventricle demonstrates  regional wall motion abnormalities (see scoring diagram/findings for  description). Left ventricular diastolic   function could not be evaluated. There is the interventricular septum is  flattened in systole and diastole, consistent with right ventricular  pressure and volume overload. Abnormal septal motion related to  intraventricular conduction delay.   2. Right ventricular systolic function is normal. The right ventricular  size is normal. There is mildly elevated pulmonary artery systolic  pressure. The estimated right ventricular systolic pressure is 36.7 mmHg.   3. Left atrial size was severely dilated.   4. Right atrial size was mild to moderately dilated.   5. A small pericardial effusion is present. The pericardial effusion is  circumferential.   6. Central mitral regurgitation with blunting of the right sided  pulmonary veins and suboptimal PISA shell. 2D MVA 6.09 cm2 with systolic  and diastolic doming of the anterior mitral valve leaflet. The mitral  valve is degenerative. Severe mitral valve  regurgitation. No evidence of mitral stenosis. Moderate mitral annular   calcification.   7. The tricuspid valve is abnormal. Tricuspid valve regurgitation is mild  to moderate.   8. The aortic valve is tricuspid. Aortic valve regurgitation is not  visualized. No aortic stenosis is present.   9. The inferior vena cava is dilated in size with <50% respiratory  variability, suggesting right atrial pressure of 15 mmHg.   Comparison(s): Prior images reviewed side by side. Mitral regurgitation is  worse.    Risk Assessment/Calculations:    CHA2DS2-VASc Score = 7   This indicates a 11.2% annual risk of stroke. The patient's score is based upon: CHF History: 1 HTN History: 1 Diabetes History: 1 Stroke History: 0 Vascular Disease History: 1 Age Score: 2 Gender Score: 1     Physical Exam:   VS:  BP 130/86   Pulse 76   Ht 5\' 3"  (1.6 m)   Wt 190 lb 9.6 oz (86.5 kg)   SpO2 97%   BMI 33.76 kg/m    Wt Readings from Last 3 Encounters:  10/28/22 190 lb 9.6 oz (86.5 kg)  10/15/22 198 lb 12.8 oz (90.2 kg)  09/30/22 202 lb (91.6 kg)     GEN: Pleasant, elderly female appearing in no acute distress NECK: No JVD; No carotid bruits CARDIAC: Irregularly irregular, 2/6 systolic murmur along Apex.  RESPIRATORY:  Clear to auscultation without rales, wheezing or rhonchi  ABDOMEN: Appears non-distended. No obvious abdominal masses. EXTREMITIES: No clubbing or cyanosis. No pitting edema.  Distal pedal pulses are 2+ bilaterally. PSYCH: A&Ox3. Does repeat the same story multiple times during this encounter.    Assessment and Plan:   1. HFmrEF - Her EF was at 45% by most recent imaging which is overall similar to prior echocardiogram in 09/2021. She appears euvolemic by examination today and her weight has been stable on her home scale since hospital discharge. - At this time, we will continue current medical therapy with Farxiga 10 mg daily, Toprol-XL 12.5 mg daily and Torsemide 20 mg in AM/10 mg in PM. She did have recent labs with her PCP last week and we will  request a copy of these. She is still on Midodrine 5 mg 3 times daily given hypotension during admission, therefore will reduce to 5 mg twice daily given her BP at 130/86 during today's visit. She was provided with a BP log and encouraged to return this in 2 to 3 weeks. If BP remains well-controlled, would like to discontinue Midodrine.  She would ultimately benefit from reinitiation of Entresto and spironolactone if renal function stabilizes and BP allows in the future.  2. CAD - She had moderate disease by catheterization in 2002 and NST in 2017 showed possible mild anterior septal and apical ischemia and medical management was pursued.  - She denies any recent anginal symptoms. Remains on Toprol XL 12.5 mg daily and Pravastatin 40 mg daily. She is not on ASA given the need for anticoagulation.  3. Mitral Regurgitation - This was severe by most recent echocardiogram earlier this month. We reviewed echocardiogram findings and also discussed a possible referral to the Structural Heart Team but she wishes to postpone this for now until her strength improves. Would again review at her next office visit as she may be a candidate for  MitraClip.   4. Paroxysmal Atrial Fibrillation/Flutter - She denies any recent palpitations and her heart rate is well-controlled in the 70's today. Continue Toprol-XL 12.5 mg daily for rate-control. - She remains on Eliquis 5 mg twice daily for anticoagulation. No reports of active bleeding. Will request a copy of most recent labs from her PCP following hospital discharge.  5. Stage 3 CKD -  Baseline creatinine 1.1 - 1.2. Peaked at 2.27 during her recent admission and had improved to 1.35 at the time of hospital discharge.    Signed, Ellsworth Lennox, PA-C

## 2022-11-02 DIAGNOSIS — I480 Paroxysmal atrial fibrillation: Secondary | ICD-10-CM | POA: Diagnosis not present

## 2022-11-02 DIAGNOSIS — N183 Chronic kidney disease, stage 3 unspecified: Secondary | ICD-10-CM | POA: Diagnosis not present

## 2022-11-02 DIAGNOSIS — I951 Orthostatic hypotension: Secondary | ICD-10-CM | POA: Diagnosis not present

## 2022-11-02 DIAGNOSIS — N390 Urinary tract infection, site not specified: Secondary | ICD-10-CM | POA: Diagnosis not present

## 2022-11-02 DIAGNOSIS — I10 Essential (primary) hypertension: Secondary | ICD-10-CM | POA: Diagnosis not present

## 2022-11-04 ENCOUNTER — Other Ambulatory Visit (HOSPITAL_COMMUNITY): Payer: 59

## 2022-11-09 DIAGNOSIS — I951 Orthostatic hypotension: Secondary | ICD-10-CM | POA: Diagnosis not present

## 2022-11-09 DIAGNOSIS — E876 Hypokalemia: Secondary | ICD-10-CM | POA: Diagnosis not present

## 2022-11-11 DIAGNOSIS — I10 Essential (primary) hypertension: Secondary | ICD-10-CM | POA: Diagnosis not present

## 2022-11-11 DIAGNOSIS — E785 Hyperlipidemia, unspecified: Secondary | ICD-10-CM | POA: Diagnosis not present

## 2022-11-11 DIAGNOSIS — N183 Chronic kidney disease, stage 3 unspecified: Secondary | ICD-10-CM | POA: Diagnosis not present

## 2022-11-12 ENCOUNTER — Encounter: Payer: Self-pay | Admitting: Family Medicine

## 2022-11-14 ENCOUNTER — Other Ambulatory Visit: Payer: Self-pay | Admitting: Cardiology

## 2022-11-16 DIAGNOSIS — I951 Orthostatic hypotension: Secondary | ICD-10-CM | POA: Diagnosis not present

## 2022-11-16 DIAGNOSIS — E786 Lipoprotein deficiency: Secondary | ICD-10-CM | POA: Diagnosis not present

## 2022-12-12 ENCOUNTER — Other Ambulatory Visit: Payer: Self-pay | Admitting: Cardiology

## 2023-01-06 NOTE — Progress Notes (Unsigned)
Cardiology Office Note    Date:  01/07/2023  ID:  [name], DOB 15-Apr-1940, MRN [id number] Cardiologist: [name], MD    History of Present Illness:    [name] is a 82 y.o. 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    She was examined by myself in 10/2022 following a recent hospitalization for an AKI and Entresto, Imdur and Spironolactone had been held at discharge given her low BP and AKI. At the time of follow-up, she reported feeling weak and was trying to gradually increase her activity level. She was still taking Midodrine 5 mg TID and this was reduced to 5 mg twice daily given her BP at 130/86. She was encouraged to return a BP log in several weeks as we may be able to discontinue Midodrine. Was recommended to consider reinitiation of Entresto or Spironolactone in the future if renal function stabilized and BP allowed.  In talking with the patient today, she reports overall doing well since her last office visit. She does have NYHA class II dyspnea but no acute changes in this. Energy level has improved. She enjoys going dancing several times a week and cleans her own home and does light yard work. Denies any chest pain or persistent palpitations with this. No specific orthopnea, PND or pitting edema. She does take Torsemide 20 mg daily and can take an extra 10 mg if needed but does not utilize this routinely.  She has not been checking her blood pressure at home but it is soft at 100/58 during today's visit with similar values by recheck. Denies any  associated dizziness or presyncope.   Studies Reviewed:   EKG: EKG is not ordered today. EKG from 10/12/2022 is reviewed and shows rate-controlled atrial fibrillation, heart rate 74 with known IVCD.   Echocardiogram: 10/2022 IMPRESSIONS     1. Left ventricular ejection fraction, by estimation, is 45%. The left  ventricle has mildly decreased function. The left ventricle demonstrates  regional wall motion abnormalities (see scoring diagram/findings for  description). Left ventricular diastolic   function could not be evaluated. There is the interventricular septum is  flattened in systole and diastole, consistent with right ventricular  pressure and volume overload. Abnormal septal motion related to  intraventricular conduction delay.   2. Right ventricular systolic function is normal. The right ventricular  size is normal. There is mildly elevated pulmonary artery systolic  pressure. The estimated right ventricular systolic pressure is 36.7 mmHg.   3. Left atrial size was severely dilated.   4. Right atrial size was mild to moderately dilated.   5. A small pericardial effusion is present. The pericardial effusion is  circumferential.   6. Central mitral regurgitation with blunting of the right sided  pulmonary veins and suboptimal PISA shell. 2D MVA 6.09 cm2 with systolic  and diastolic doming of the anterior mitral valve leaflet. The mitral  valve is degenerative. Severe mitral valve  regurgitation. No evidence of mitral stenosis. Moderate mitral annular  calcification.   7. The tricuspid valve is abnormal. Tricuspid valve regurgitation is mild  to moderate.   8. The aortic  valve is tricuspid. Aortic valve regurgitation is not  visualized. No aortic stenosis is present.   9. The inferior vena cava is dilated in size with <50% respiratory  variability, suggesting right atrial pressure of 15 mmHg.   Comparison(s): Prior images reviewed side by side. Mitral regurgitation is   worse.    Risk Assessment/Calculations:   CHA2DS2-VASc Score = 7   This indicates a 11.2% annual risk of stroke. The patient's score is based upon: CHF History: 1 HTN History: 1 Diabetes History: 1 Stroke History: 0 Vascular Disease History: 1 Age Score: 2 Gender Score: 1    Physical Exam:   VS:  BP (!) 100/58   Pulse (!) 59   Ht 5' 2.5" (1.588 m)   Wt 198 lb 6.4 oz (90 kg)   SpO2 94%   BMI 35.71 kg/m    Wt Readings from Last 3 Encounters:  01/07/23 198 lb 6.4 oz (90 kg)  10/28/22 190 lb 9.6 oz (86.5 kg)  10/15/22 198 lb 12.8 oz (90.2 kg)     GEN: Pleasant, elderly female appearing in no acute distress NECK: No JVD; No carotid bruits CARDIAC: Irregularly irregular, 2/6 systolic murmur most prominent along Apex.  RESPIRATORY:  Clear to auscultation without rales, wheezing or rhonchi  ABDOMEN: Appears non-distended. No obvious abdominal masses. EXTREMITIES: No clubbing or cyanosis. No pitting edema.  Distal pedal pulses are 2+ bilaterally. Varicose veins noted.    Assessment and Plan:   1. Chronic HFmrEF - Her ejection fraction was at 45% when checked in 10/2022 and similar to prior imaging. As discussed above, medical therapy has been limited given her soft BP requiring the use of Midodrine.  Given her BP at 100/58 with similar values by recheck, I am unable to further titrate medical therapy.  She was previously on Entresto and Spironolactone but both of these were discontinued earlier this year given hypotension and AKI at that time. For now, continue Toprol-XL 12.5 mg daily, Farxiga 10 mg daily, Torsemide 20 mg daily and Midodrine 5 mg twice daily.  2. CAD - She had known moderate disease by remote catheterization in 2002 and most recent ischemic evaluation was an NST in 2017 which showed possible anterior septal and apical ischemia and medical management was pursued at that time.  - She denies any recent chest pain and respiratory status has been stable. - She is  not on ASA given the need for anticoagulation. Continue Toprol-XL 12.5 mg daily and Pravastatin 40 mg daily. Requesting most recent labs from her PCP to make sure a recent FLP has been obtained.  3. Hypotension - BP is soft at 158 during today's visit with repeat at 102/62.  She has not checked this at home recently and encouraged her to check this at least 1-2 times per week.  She denies any associated dizziness or presyncope.  Will continue midodrine 5 mg twice daily for now and this can be further titrated if needed.  4. Mitral Regurgitation - This was severe by echocardiogram in 10/2022. Will obtain a follow-up echocardiogram prior to her next visit. If this remains in a severe range, she may benefit from referral to the Structural Heart Team for consideration of MitraClip.  5. Paroxysmal Atrial Fibrillation/Flutter - She reports occasional palpitations but no persistent symptoms. Heart rate is in the 50's to 60's during today's visit. Continue Toprol-XL 12.5 mg daily for rate-control. - No reports of active bleeding. Continue Eliquis 5 mg twice daily for anticoagulation. If creatinine remains above 1.5,  dosing will need to be reduced to 2.5 mg twice daily given her age of 82 yo.   6. Stage 3 CKD - Baseline creatinine 1.1-1.2 but peaked at 2.27 in 10/2022. Had improved to 1.58 when checked on 11/09/2022. Will request most recent labs from PCP.   Disposition: She requests to wait until 04/2023 for her follow-up echocardiogram and will arrange for it at that time for reassessment of her EF and MR with clinic follow-up afterwards.   Signed, Ellsworth Lennox, PA-C

## 2023-01-07 ENCOUNTER — Encounter: Payer: Self-pay | Admitting: Student

## 2023-01-07 ENCOUNTER — Encounter: Payer: Self-pay | Admitting: *Deleted

## 2023-01-07 ENCOUNTER — Ambulatory Visit: Payer: 59 | Attending: Student | Admitting: Student

## 2023-01-07 VITALS — BP 100/58 | HR 59 | Ht 62.5 in | Wt 198.4 lb

## 2023-01-07 DIAGNOSIS — I5022 Chronic systolic (congestive) heart failure: Secondary | ICD-10-CM | POA: Diagnosis not present

## 2023-01-07 DIAGNOSIS — I480 Paroxysmal atrial fibrillation: Secondary | ICD-10-CM | POA: Diagnosis not present

## 2023-01-07 DIAGNOSIS — N1832 Chronic kidney disease, stage 3b: Secondary | ICD-10-CM

## 2023-01-07 DIAGNOSIS — I2510 Atherosclerotic heart disease of native coronary artery without angina pectoris: Secondary | ICD-10-CM | POA: Diagnosis not present

## 2023-01-07 DIAGNOSIS — I340 Nonrheumatic mitral (valve) insufficiency: Secondary | ICD-10-CM

## 2023-01-07 DIAGNOSIS — I959 Hypotension, unspecified: Secondary | ICD-10-CM

## 2023-01-07 NOTE — Patient Instructions (Signed)
Medication Instructions:  Your physician recommends that you continue on your current medications as directed. Please refer to the Current Medication list given to you today.  *If you need a refill on your cardiac medications before your next appointment, please call your pharmacy*   Lab Work: NONE   If you have labs (blood work) drawn today and your tests are completely normal, you will receive your results only by: MyChart Message (if you have MyChart) OR A paper copy in the mail If you have any lab test that is abnormal or we need to change your treatment, we will call you to review the results.   Testing/Procedures: Your physician has requested that you have an echocardiogram. Echocardiography is a painless test that uses sound waves to create images of your heart. It provides your doctor with information about the size and shape of your heart and how well your heart's chambers and valves are working. This procedure takes approximately one hour. There are no restrictions for this procedure. Please do NOT wear cologne, perfume, aftershave, or lotions (deodorant is allowed). Please arrive 15 minutes prior to your appointment time.    Follow-Up: At [facility], you and your health needs are our priority.  As part of our continuing mission to provide you with exceptional heart care, we have created designated Provider Care Teams.  These Care Teams include your primary Cardiologist (physician) and Advanced Practice Providers (APPs -  Physician Assistants and Nurse Practitioners) who all work together to provide you with the care you need, when you need it.  We recommend signing up for the patient portal called "MyChart".  Sign up information is provided on this After Visit Summary.  MyChart is used to connect with patients for Virtual Visits (Telemedicine).  Patients are able to view lab/test results, encounter notes, upcoming appointments, etc.  Non-urgent messages can be sent to  your provider as well.   To learn more about what you can do with MyChart, go to [email or web address].    Your next appointment:    January   Provider:   You may see [name], MD or one of the following Advanced Practice Providers on your designated Care Team:   Randall An, PA-C  Jacolyn Reedy, PA-C     Other Instructions Thank you for choosing [facility]!

## 2023-01-11 ENCOUNTER — Other Ambulatory Visit: Payer: Self-pay | Admitting: Cardiology

## 2023-01-11 DIAGNOSIS — R001 Bradycardia, unspecified: Secondary | ICD-10-CM | POA: Diagnosis not present

## 2023-01-11 DIAGNOSIS — I951 Orthostatic hypotension: Secondary | ICD-10-CM | POA: Diagnosis not present

## 2023-02-10 ENCOUNTER — Encounter: Payer: Self-pay | Admitting: Podiatry

## 2023-02-10 ENCOUNTER — Ambulatory Visit (INDEPENDENT_AMBULATORY_CARE_PROVIDER_SITE_OTHER): Payer: 59

## 2023-02-10 ENCOUNTER — Ambulatory Visit (INDEPENDENT_AMBULATORY_CARE_PROVIDER_SITE_OTHER): Payer: 59 | Admitting: Podiatry

## 2023-02-10 DIAGNOSIS — M778 Other enthesopathies, not elsewhere classified: Secondary | ICD-10-CM

## 2023-02-10 DIAGNOSIS — M722 Plantar fascial fibromatosis: Secondary | ICD-10-CM | POA: Diagnosis not present

## 2023-02-10 MED ORDER — TRIAMCINOLONE ACETONIDE 10 MG/ML IJ SUSP
10.0000 mg | Freq: Once | INTRAMUSCULAR | Status: AC
Start: 2023-02-10 — End: 2023-02-10
  Administered 2023-02-10: 10 mg via INTRA_ARTICULAR

## 2023-02-10 NOTE — Progress Notes (Signed)
Subjective:   Patient ID: [name], female   DOB: 82 y.o.   MRN: [id number]   HPI Patient states she is developed a lot of pain in her heel left over right and it has been inflamed with fluid in the medial band.  States has been going on for about 5 months   ROS      Objective:  Physical Exam  Neuro vascular status intact exquisite discomfort medial fascial band left over right inflammation fluid of the band at insertion     Assessment:  Acute plantar fasciitis left over right with pain     Plan:  H&P x-rays reviewed went ahead today and will get a focus on the left which is more acute I did sterile prep injected the fascia at insertion 3 mg Kenalog 5 mg Xylocaine I advised on supportive shoes reappoint as symptoms indicate.  X-rays were negative for stress fracture arthritis did show spur formation plantar heel bilateral

## 2023-02-12 ENCOUNTER — Other Ambulatory Visit: Payer: Self-pay

## 2023-02-12 ENCOUNTER — Telehealth: Payer: Self-pay | Admitting: Cardiology

## 2023-02-12 MED ORDER — APIXABAN 5 MG PO TABS: 5.0000 mg | ORAL_TABLET | Freq: Two times a day (BID) | ORAL | 5 refills | Status: DC

## 2023-02-12 NOTE — Telephone Encounter (Signed)
*  STAT* If patient is at the pharmacy, call can be transferred to refill team.   1. Which medications need to be refilled? (please list name of each medication and dose if known)   apixaban (ELIQUIS) 5 MG TABS tablet   2. Would you like to learn more about the convenience, safety, & potential cost savings by using the [facility]?   3. Are you open to using the [facility] (Type [facility]. ).  4. Which pharmacy/location (including street and city if local pharmacy) is medication to be sent to?  [facility] [place], [place] - [address]   5. Do they need a 30 day or 90 day supply?   90 day  Caller (Tamia) stated patient still has some medication left.

## 2023-02-12 NOTE — Telephone Encounter (Signed)
Prescription refill request for Eliquis received. Indication:afib Last office visit:9/24 Scr:1.35  7/24 Age: 82 Weight:90  kg  Prescription refilled

## 2023-02-15 NOTE — Telephone Encounter (Signed)
Prescription refill request for Eliquis received. Indication: A Flutter Last office visit: 01/07/23  B Strader PA-C Scr: 1.58 on 7.29.24 Age: 82 Weight: 90kg  Pt is taking Eliquis 5mg  daily.  Based on above findings Eliquis 2.5mg  daily is the appropriate dose.  Message sent to [facility] to verify dose reduction.

## 2023-02-16 MED ORDER — APIXABAN 5 MG PO TABS: 5.0000 mg | ORAL_TABLET | Freq: Two times a day (BID) | ORAL | 1 refills | Status: DC

## 2023-02-16 NOTE — Telephone Encounter (Signed)
Per Pharmacy recommendation will leave Eliquis dose at 5mg  bid.  Refill sent.

## 2023-02-16 NOTE — Telephone Encounter (Signed)
She actually had a scr done 01/11/23 (per [facility]) that was 1.45. she does fluctuate a little bit, but most of her scr that were > 1.5 were when she had an AKI. I would leave at 5mg  BID for now.

## 2023-03-01 ENCOUNTER — Ambulatory Visit (INDEPENDENT_AMBULATORY_CARE_PROVIDER_SITE_OTHER): Payer: 59

## 2023-03-01 ENCOUNTER — Encounter: Payer: Self-pay | Admitting: Podiatry

## 2023-03-01 ENCOUNTER — Ambulatory Visit (INDEPENDENT_AMBULATORY_CARE_PROVIDER_SITE_OTHER): Payer: 59 | Admitting: Podiatry

## 2023-03-01 DIAGNOSIS — M778 Other enthesopathies, not elsewhere classified: Secondary | ICD-10-CM

## 2023-03-01 DIAGNOSIS — M722 Plantar fascial fibromatosis: Secondary | ICD-10-CM

## 2023-03-01 MED ORDER — TRIAMCINOLONE ACETONIDE 10 MG/ML IJ SUSP
10.0000 mg | Freq: Once | INTRAMUSCULAR | Status: AC
Start: 2023-03-01 — End: 2023-03-01
  Administered 2023-03-01: 10 mg via INTRA_ARTICULAR

## 2023-03-01 NOTE — Progress Notes (Signed)
Subjective:   Patient ID: [name], female   DOB: 82 y.o.   MRN: [id number]   HPI Patient states her left heel is still really bothering her and making it hard to walk.  States that she has to be very active with her life and that is made it difficult   ROS      Objective:  Physical Exam  Neurovascular status intact with intense discomfort underneath the left plantar fascia fluid buildup noted soreness when pressed     Assessment:  Acute plantar fasciitis left that is not responded so far conservatively     Plan:  H&P reviewed and I went ahead today I did sterile prep and injected the plantar fascia 3 mg Kenalog 5 mg Xylocaine and then dispensed air fracture walker properly fitted to her lower leg with all questions answered at this current time concerning it.  Reappoint in 4 weeks

## 2023-03-22 ENCOUNTER — Ambulatory Visit: Payer: 59 | Admitting: Podiatry

## 2023-03-22 DIAGNOSIS — I951 Orthostatic hypotension: Secondary | ICD-10-CM | POA: Diagnosis not present

## 2023-03-22 DIAGNOSIS — I480 Paroxysmal atrial fibrillation: Secondary | ICD-10-CM | POA: Diagnosis not present

## 2023-03-22 DIAGNOSIS — N1832 Chronic kidney disease, stage 3b: Secondary | ICD-10-CM | POA: Diagnosis not present

## 2023-03-22 DIAGNOSIS — I5032 Chronic diastolic (congestive) heart failure: Secondary | ICD-10-CM | POA: Diagnosis not present

## 2023-03-22 DIAGNOSIS — R001 Bradycardia, unspecified: Secondary | ICD-10-CM | POA: Diagnosis not present

## 2023-03-22 DIAGNOSIS — I2510 Atherosclerotic heart disease of native coronary artery without angina pectoris: Secondary | ICD-10-CM | POA: Diagnosis not present

## 2023-03-23 ENCOUNTER — Ambulatory Visit: Payer: 59 | Admitting: Cardiology

## 2023-03-24 ENCOUNTER — Ambulatory Visit: Payer: 59 | Admitting: Podiatry

## 2023-04-20 ENCOUNTER — Ambulatory Visit (HOSPITAL_COMMUNITY): Admission: RE | Admit: 2023-04-20 | Payer: 59 | Source: Ambulatory Visit

## 2023-05-05 ENCOUNTER — Ambulatory Visit (HOSPITAL_COMMUNITY)
Admission: RE | Admit: 2023-05-05 | Discharge: 2023-05-05 | Disposition: A | Discharge status: Home / Self Care | Payer: 59 | Source: Ambulatory Visit | Attending: Student | Admitting: Student

## 2023-05-05 DIAGNOSIS — I5022 Chronic systolic (congestive) heart failure: Secondary | ICD-10-CM | POA: Insufficient documentation

## 2023-05-05 DIAGNOSIS — I340 Nonrheumatic mitral (valve) insufficiency: Secondary | ICD-10-CM | POA: Insufficient documentation

## 2023-05-05 LAB — ECHOCARDIOGRAM COMPLETE
AR max vel: 1.92 cm2
AV Area VTI: 1.99 cm2
AV Area mean vel: 2 cm2
AV Mean grad: 7 mm[Hg]
AV Peak grad: 12.2 mm[Hg]
Ao pk vel: 1.75 m/s
Area-P 1/2: 2.73 cm2
Calc EF: 52.7 %
MV M vel: 4.44 m/s
MV Peak grad: 78.9 mm[Hg]
S' Lateral: 4.1 cm
Single Plane A2C EF: 58 %
Single Plane A4C EF: 48.9 %

## 2023-05-05 NOTE — Progress Notes (Signed)
*  PRELIMINARY RESULTS* Echocardiogram 2D Echocardiogram has been performed.  [name] 05/05/2023, 3:18 PM

## 2023-05-10 ENCOUNTER — Encounter: Payer: Self-pay | Admitting: Cardiology

## 2023-05-10 ENCOUNTER — Ambulatory Visit: Payer: 59 | Attending: Cardiology | Admitting: Cardiology

## 2023-05-10 VITALS — BP 94/50 | HR 57 | Ht 62.0 in | Wt 189.2 lb

## 2023-05-10 DIAGNOSIS — I480 Paroxysmal atrial fibrillation: Secondary | ICD-10-CM

## 2023-05-10 DIAGNOSIS — N183 Chronic kidney disease, stage 3 unspecified: Secondary | ICD-10-CM | POA: Diagnosis not present

## 2023-05-10 DIAGNOSIS — I25119 Atherosclerotic heart disease of native coronary artery with unspecified angina pectoris: Secondary | ICD-10-CM

## 2023-05-10 DIAGNOSIS — I5022 Chronic systolic (congestive) heart failure: Secondary | ICD-10-CM

## 2023-05-10 MED ORDER — ENTRESTO 24-26 MG PO TABS: 1.0000 | ORAL_TABLET | Freq: Two times a day (BID) | ORAL | 6 refills | Status: DC

## 2023-05-10 NOTE — Progress Notes (Signed)
Cardiology Office Note  Date: 05/10/2023   ID: [name], DOB Dec 20, 1940, MRN [id number]  History of Present Illness: Jillian PECOR is an 83 y.o. female last seen in September 2024 by Ms. Strader PA-C, I reviewed the note.  She is here for a follow-up visit.  Reports NYHA class II-III dyspnea depending on level of activity, no angina, no palpitations or syncope.  I reviewed her medications.  She does not know the names of all of her medications, we did check with her pharmacy to clarify.  At this point not entirely certain that she is taking midodrine.  Current regimen includes Eliquis, Demadex, Aldactone, Pravachol, Toprol-XL, Imdur, Farxiga, Entresto, and as needed nitroglycerin.  I reviewed her lab work from July 2024.  She did have a recent follow-up echocardiogram showing some improvement in LVEF, now 45 to 50% range.  Also only mild mitral regurgitation.  Physical Exam: VS:  BP (!) 94/50   Pulse (!) 57   Ht 5\' 2"  (1.575 m)   Wt 189 lb 3.2 oz (85.8 kg)   SpO2 97%   BMI 34.61 kg/m , BMI Body mass index is 34.61 kg/m.  Wt Readings from Last 3 Encounters:  05/10/23 189 lb 3.2 oz (85.8 kg)  01/07/23 198 lb 6.4 oz (90 kg)  10/28/22 190 lb 9.6 oz (86.5 kg)    General: Patient appears comfortable at rest. HEENT: Conjunctiva and lids normal. Neck: Supple, no elevated JVP or carotid bruits. Lungs: Clear to auscultation, nonlabored breathing at rest. Cardiac: Irregular irregular, 2/6 systolic murmur, no gallop. Extremities: Venous stasis.  ECG:  An ECG dated 10/12/2022 was personally reviewed today and demonstrated:  Rate controlled atrial fibrillation with left bundle branch block.  Labwork: 10/12/2022: ALT 15; AST 21; Hemoglobin 14.9; Platelets 244; TSH 0.996 10/15/2022: BUN 17; Creatinine, Ser 1.35; Potassium 4.9; Sodium 138   Other Studies Reviewed Today:  Echocardiogram 05/05/2023:  1. Left ventricular ejection fraction, by estimation, is 45 to 50%. The  left  ventricle has mildly decreased function. The left ventricle  demonstrates global hypokinesis. Left ventricular diastolic parameters are  consistent with Grade I diastolic  dysfunction (impaired relaxation).   2. Right ventricular systolic function is normal. The right ventricular  size is normal. There is normal pulmonary artery systolic pressure. The  estimated right ventricular systolic pressure is 23.8 mmHg.   3. Left atrial size was moderately dilated.   4. Right atrial size was moderately dilated.   5. The mitral valve is degenerative. Mild mitral valve regurgitation.   6. The aortic valve is tricuspid. Aortic valve regurgitation is not  visualized. Aortic valve sclerosis is present, with no evidence of aortic  valve stenosis. Aortic valve mean gradient measures 7.0 mmHg.   7. The inferior vena cava is normal in size with greater than 50%  respiratory variability, suggesting right atrial pressure of 3 mmHg.   Assessment and Plan:  1.  HFmrEF, recent follow-up echocardiogram showing LVEF 45 to 50% with global hypokinesis, normal RV contraction.  Plan to continue Toprol XL, Demadex, Farxiga and Aldactone.  Decrease Entresto to 24/26 mg twice daily.  2.  CAD, moderate by cardiac catheterization in 2002 and with follow-up Myoview in 2017 demonstrating mild anteroseptal/apical ischemia managed medically.  No active angina at current level of activity.  Continue Pravachol.  3.  Paroxysmal atrial fibrillation/flutter.  CHA2DS2-VASc score is 7.  She is on Eliquis for stroke prophylaxis.  Denies any spontaneous bleeding problems.  4.  Primary hypertension by  history.  Blood pressure has been lower on multimodal therapy.  Adjustments made as discussed above.  5.  Mixed hyperlipidemia.  Continues on Pravachol with follow-up by Dr. [name].  6.  Relative hypotension.  On midodrine per chart review, checking with pharmacy to clarify.  7.  CKD stage IIIb.  Creatinine 1.35 in July 2024.  8.   Mitral regurgitation, mild by recent follow-up echocardiogram.  Disposition:  Follow up  3 months.  Signed, Jonelle Sidle, M.D., F.A.C.C. [facility]

## 2023-05-10 NOTE — Patient Instructions (Addendum)
Medication Instructions:  Your physician has recommended you make the following change in your medication: Decrease Entresto to 24/26 mg twice daily Continue all other medications as prescribed  Labwork: none  Testing/Procedures: none  Follow-Up: Your physician recommends that you schedule a follow-up appointment in: 3 months  Any Other Special Instructions Will Be Listed Below (If Applicable).  If you need a refill on your cardiac medications before your next appointment, please call your pharmacy.

## 2023-06-21 DIAGNOSIS — I951 Orthostatic hypotension: Secondary | ICD-10-CM | POA: Diagnosis not present

## 2023-06-21 DIAGNOSIS — I2510 Atherosclerotic heart disease of native coronary artery without angina pectoris: Secondary | ICD-10-CM | POA: Diagnosis not present

## 2023-06-21 DIAGNOSIS — N390 Urinary tract infection, site not specified: Secondary | ICD-10-CM | POA: Diagnosis not present

## 2023-06-21 DIAGNOSIS — I480 Paroxysmal atrial fibrillation: Secondary | ICD-10-CM | POA: Diagnosis not present

## 2023-06-21 DIAGNOSIS — I10 Essential (primary) hypertension: Secondary | ICD-10-CM | POA: Diagnosis not present

## 2023-06-21 DIAGNOSIS — N1832 Chronic kidney disease, stage 3b: Secondary | ICD-10-CM | POA: Diagnosis not present

## 2023-06-21 DIAGNOSIS — R001 Bradycardia, unspecified: Secondary | ICD-10-CM | POA: Diagnosis not present

## 2023-06-21 DIAGNOSIS — I1310 Hypertensive heart and chronic kidney disease without heart failure, with stage 1 through stage 4 chronic kidney disease, or unspecified chronic kidney disease: Secondary | ICD-10-CM | POA: Diagnosis not present

## 2023-07-05 ENCOUNTER — Other Ambulatory Visit: Payer: Self-pay | Admitting: Cardiology

## 2023-07-05 DIAGNOSIS — I951 Orthostatic hypotension: Secondary | ICD-10-CM | POA: Diagnosis not present

## 2023-07-05 DIAGNOSIS — N390 Urinary tract infection, site not specified: Secondary | ICD-10-CM | POA: Diagnosis not present

## 2023-08-05 ENCOUNTER — Telehealth: Payer: Self-pay | Admitting: Cardiology

## 2023-08-05 MED ORDER — SPIRONOLACTONE 25 MG PO TABS: 12.5000 mg | ORAL_TABLET | Freq: Every day | ORAL | 1 refills | Status: DC

## 2023-08-05 NOTE — Telephone Encounter (Signed)
*  STAT* If patient is at the pharmacy, call can be transferred to refill team.   1. Which medications need to be refilled? (please list name of each medication and dose if known)   spironolactone  (ALDACTONE ) 25 MG tablet   2. Which pharmacy/location (including street and city if local pharmacy) is medication to be sent to? [facility] - [place], [place] - [address] Phone: [phone]  Fax: [phone]       Significant History/Details     3. Do they need a 30 day or 90 day supply? 90

## 2023-08-09 ENCOUNTER — Ambulatory Visit: Payer: 59 | Admitting: Cardiology

## 2023-08-11 ENCOUNTER — Ambulatory Visit: Admitting: Nurse Practitioner

## 2023-09-17 ENCOUNTER — Other Ambulatory Visit: Payer: Self-pay | Admitting: Cardiology

## 2023-09-28 ENCOUNTER — Encounter: Payer: Self-pay | Admitting: Neurology

## 2023-09-28 ENCOUNTER — Telehealth: Payer: Self-pay | Admitting: Neurology

## 2023-09-28 ENCOUNTER — Other Ambulatory Visit: Payer: Self-pay | Admitting: Neurology

## 2023-09-28 ENCOUNTER — Ambulatory Visit (INDEPENDENT_AMBULATORY_CARE_PROVIDER_SITE_OTHER): Admitting: Neurology

## 2023-09-28 VITALS — BP 108/60 | Ht 62.0 in | Wt 184.0 lb

## 2023-09-28 DIAGNOSIS — F02A11 Dementia in other diseases classified elsewhere, mild, with agitation: Secondary | ICD-10-CM

## 2023-09-28 DIAGNOSIS — G301 Alzheimer's disease with late onset: Secondary | ICD-10-CM

## 2023-09-28 MED ORDER — DONEPEZIL HCL 5 MG PO TABS: 5.0000 mg | ORAL_TABLET | Freq: Every day | ORAL | 0 refills | Status: DC
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
## 2023-09-28 NOTE — Patient Instructions (Signed)
 Continue current medications  Start  Aricept 5 mg nightly, side effects include diarrhea, dizziness and vivid dream  Will obtain dementia labs including ATN, B12 and TSH  MRI Brain without contrast  Continue to follow up with PCP Return in a year or sooner if worse    Alzheimer's Disease Alzheimer's disease is a disease that affects the way your brain works. It affects memory and causes changes in how you think, talk, and act. The disease gets worse over time. Alzheimer's disease is a form of dementia. What are the causes? Alzheimer's disease happens when a protein called beta-amyloid forms deposits in the brain. It's not known what causes these to form. The disease may also be caused by a harmful change in a gene that's passed down, or inherited, from one or both parents. Not everyone who gets the changed gene from their parents will get the disease. What increases the risk? Being older than 81 years of age. Being female. Having any of these conditions: High blood pressure. Diabetes. Heart or blood vessel disease. Smoking. Being very overweight. Having a brain injury or a stroke in the past. Having a family history of dementia. What are the signs or symptoms?  Symptoms may happen in three stages, which often overlap. Early stage In this stage, you can still do things on your own. You may still be able to drive, work, and be social. Symptoms in this stage include: Forgetting small things, like a name, words, or what you did recently. Having a hard time with: Paying attention or learning new things. Talking with people. Doing your usual tasks. Solving problems or doing math. Following instructions. Feeling worried or nervous. Not wanting to be around people. Losing interest in doing things. Moderate stage In this stage, you'll start to need care. Symptoms include: Trouble saying what you're thinking. Memory loss that affects daily life. This can include forgetting: Things  that happened recently. If you've taken medicines or eaten. Places you know. You may get lost while walking or driving. To pay bills. To bathe or use the bathroom. Being confused about where you are or what time it is. Trouble judging distance. Changes in how you feel or act. You may be moody, angry, upset, scared, worried, or suspicious. Not thinking clearly or making good choices. You may have delusions or false beliefs. Hallucinations. This means you see, hear, taste, smell, or feel things that aren't real. Severe stage In the severe stage, you'll need help with your personal care and daily activities. Symptoms include: Memory loss getting worse. Personality changes. Not knowing where you are. Physical problems, like trouble walking, sitting, or swallowing. More trouble talking with others. Not being able to control when you pee (urinate) or poop. More changes in how you act. How is this diagnosed? You may need to see a nervous system specialist, called a neurologist, or a health care provider who focuses on the care of older adults. Alzheimer's disease may be diagnosed based on: Your symptoms and medical history. Your provider will talk with you and your family, friends, and caregivers about your history and symptoms. A physical exam. Tests. These may include: Lab tests, such as tests on your blood or pee. Imaging tests. You may have a CT scan, a PET scan, or an MRI. A lumbar puncture. For this test, a sample of the fluid around the brain and spinal cord is taken and tested. Tests to check your thinking and memory. Genetic testing. This may be done if you get the  disease before age 68 or if other family members have had the disease. How is this treated? At this time, there's no cure for Alzheimer's disease. The goals of treatment are to: Manage symptoms that affect behavior. Make sure you're safe at home. Help manage daily life for you and your caregivers. Treatment may  include: Medicines. You may get medicines that can: Slow down how fast the disease gets worse. Help with memory and behavior. Cognitive therapy. This gives you support, training to help with thinking skills, and memory aids. Counseling or spiritual guidance. These can help you deal with the many feelings you may have, such as fear, anger, or feeling alone. Caregivers. These are people who help you with your daily tasks. Family support groups. These allow your family members to learn about the disease, get emotional support, and find out about resources to help take care of you. Follow these instructions at home:  Medicines Take over-the-counter and prescription medicines only as told by your provider. Use a pill organizer or pill reminder to help you keep track of your medicines. Avoid taking medicines that can affect thinking, such as medicines for pain or sleep. Lifestyle Make healthy choices: Be active as told by your provider. Regular exercise may help with symptoms. Do not smoke, vape, or use products with nicotine or tobacco in them. Do not drink alcohol. Eat a healthy diet. When you feel a lot of stress, do something that helps you relax. Try things like yoga or deep breathing. Spend time with people. Drink enough fluid to keep your pee pale yellow. Make sure you sleep well. These tips can help: Try not to take long naps during the day. Take short naps of 30 minutes or less if needed. Keep your bedroom dark and cool. Do not exercise during the few hours before you go to bed. Avoid caffeine products in the afternoon and evening. General instructions Work with your provider to decide: What things you need help with. What your safety needs are. If you were given a bracelet that tracks where you are and shows that you're a person with memory loss, make sure you wear it at all times. Talk with your provider about whether it's safe for you to drive. Work with your family to make  big legal or health decisions. This may include things like advance directives, medical power of attorney, or a living will. Where to find more information There are two ways to contact the Alzheimer[employer]: Call the 24-hour helpline at 415-004-7779. Visit [employer] Contact a health care provider if: Your medicine causes you to have nausea, vomiting, or trouble with eating. You have mood or behavior changes that are getting worse, such as feeling depressed, worried, or nervous. You have hallucinations. You or your family members are worried about your safety. You're hard to wake up. Your memory suddenly gets worse. Get help right away if: You feel like you may hurt yourself or others. You have thoughts about taking your own life. Take one of these steps: Go to your nearest emergency room. Call 911. Call the National Suicide Prevention Lifeline at 240-876-1801 or 988. Text the Crisis Text Line at 7177763421. This information is not intended to replace advice given to you by your health care provider. Make sure you discuss any questions you have with your health care provider. Document Revised: 06/30/2022 Document Reviewed: 06/30/2022 Elsevier Patient Education  2024 [employer].

## 2023-09-28 NOTE — Telephone Encounter (Signed)
 no auth required sent to GI [phone]

## 2023-10-01 LAB — VITAMIN B12: Vitamin B-12: 163 pg/mL — ABNORMAL LOW (ref 232–1245)

## 2023-10-01 LAB — ATN PROFILE
A -- Beta-amyloid 42/40 Ratio: 0.07 — ABNORMAL LOW (ref >0.102)
Beta-amyloid 40: 501.44 pg/mL
Beta-amyloid 42: 35.06 pg/mL
N -- NfL, Plasma: 9.23 pg/mL — ABNORMAL HIGH (ref 0.00–9.13)
T -- p-tau181: 2.93 pg/mL — ABNORMAL HIGH (ref 0.00–0.97)

## 2023-10-01 LAB — TSH: TSH: 2.04 u[IU]/mL (ref 0.450–4.500)

## 2023-10-04 ENCOUNTER — Ambulatory Visit: Payer: Self-pay | Admitting: Neurology

## 2023-10-04 MED ORDER — VITAMIN B-12 1000 MCG PO TABS: 1000.0000 ug | ORAL_TABLET | Freq: Every day | ORAL | 3 refills | Status: AC

## 2023-10-04 NOTE — Progress Notes (Signed)
 Please call and advise the patient/family that the recent labs we checked support the diagnosis of Alzheimer disease. The labs also showed a low Vitamin B12 level. Continue current medications and I will start her on Vitamin B12 supplement to take for the next year. Please remind patient to keep any upcoming appointments or tests and to call us  with any interim questions, concerns, problems or updates. Thanks,   Pastor Falling, MD

## 2023-11-01 ENCOUNTER — Other Ambulatory Visit: Payer: Self-pay | Admitting: Cardiology

## 2023-11-12 ENCOUNTER — Other Ambulatory Visit: Payer: Self-pay | Admitting: Cardiology

## 2023-12-01 ENCOUNTER — Other Ambulatory Visit: Payer: Self-pay | Admitting: Cardiology

## 2023-12-01 NOTE — Telephone Encounter (Signed)
 Prescription refill request for [name]  received. Indication: AF Last office visit: 05/10/23  GORMAN Sierras MD Scr: 1.35 on 10/15/22  Epic Age: 83 Weight: 85.8kg  Based on above findings [name]  5mg  twice daily is the appropriate dose.  Pt is pt due for appt with Dr Sierras and lab work.  Message sent to schedulers.  Refill approved x 1 only.

## 2023-12-20 DIAGNOSIS — Y92512 Supermarket, store or market as the place of occurrence of the external cause: Secondary | ICD-10-CM | POA: Diagnosis not present

## 2023-12-20 DIAGNOSIS — I2510 Atherosclerotic heart disease of native coronary artery without angina pectoris: Secondary | ICD-10-CM | POA: Diagnosis not present

## 2023-12-20 DIAGNOSIS — W1830XA Fall on same level, unspecified, initial encounter: Secondary | ICD-10-CM | POA: Diagnosis not present

## 2023-12-20 DIAGNOSIS — R001 Bradycardia, unspecified: Secondary | ICD-10-CM | POA: Diagnosis not present

## 2023-12-20 DIAGNOSIS — I951 Orthostatic hypotension: Secondary | ICD-10-CM | POA: Diagnosis not present

## 2023-12-20 DIAGNOSIS — M25561 Pain in right knee: Secondary | ICD-10-CM | POA: Diagnosis not present

## 2023-12-20 DIAGNOSIS — N1832 Chronic kidney disease, stage 3b: Secondary | ICD-10-CM | POA: Diagnosis not present

## 2023-12-20 DIAGNOSIS — I480 Paroxysmal atrial fibrillation: Secondary | ICD-10-CM | POA: Diagnosis not present

## 2023-12-23 DIAGNOSIS — N1832 Chronic kidney disease, stage 3b: Secondary | ICD-10-CM | POA: Diagnosis not present

## 2023-12-23 DIAGNOSIS — I951 Orthostatic hypotension: Secondary | ICD-10-CM | POA: Diagnosis not present

## 2023-12-23 DIAGNOSIS — I2510 Atherosclerotic heart disease of native coronary artery without angina pectoris: Secondary | ICD-10-CM | POA: Diagnosis not present

## 2023-12-23 DIAGNOSIS — I480 Paroxysmal atrial fibrillation: Secondary | ICD-10-CM | POA: Diagnosis not present

## 2023-12-23 DIAGNOSIS — M25561 Pain in right knee: Secondary | ICD-10-CM | POA: Diagnosis not present

## 2023-12-31 ENCOUNTER — Other Ambulatory Visit: Payer: Self-pay | Admitting: Cardiology

## 2024-01-12 ENCOUNTER — Ambulatory Visit: Attending: Cardiology | Admitting: Cardiology

## 2024-01-12 ENCOUNTER — Encounter: Payer: Self-pay | Admitting: Cardiology

## 2024-01-12 ENCOUNTER — Other Ambulatory Visit (HOSPITAL_COMMUNITY)
Admission: RE | Admit: 2024-01-12 | Discharge: 2024-01-12 | Disposition: A | Discharge status: Home / Self Care | Source: Ambulatory Visit | Attending: Cardiology | Admitting: Cardiology

## 2024-01-12 ENCOUNTER — Ambulatory Visit: Payer: Self-pay | Admitting: Cardiology

## 2024-01-12 VITALS — BP 124/70 | HR 52 | Ht 62.5 in | Wt 179.0 lb

## 2024-01-12 DIAGNOSIS — I25119 Atherosclerotic heart disease of native coronary artery with unspecified angina pectoris: Secondary | ICD-10-CM

## 2024-01-12 DIAGNOSIS — I2510 Atherosclerotic heart disease of native coronary artery without angina pectoris: Secondary | ICD-10-CM | POA: Diagnosis not present

## 2024-01-12 DIAGNOSIS — I480 Paroxysmal atrial fibrillation: Secondary | ICD-10-CM

## 2024-01-12 DIAGNOSIS — I5020 Unspecified systolic (congestive) heart failure: Secondary | ICD-10-CM

## 2024-01-12 DIAGNOSIS — N1832 Chronic kidney disease, stage 3b: Secondary | ICD-10-CM | POA: Diagnosis not present

## 2024-01-12 LAB — BASIC METABOLIC PANEL WITH GFR
Anion gap: 14 (ref 5–15)
BUN: 24 mg/dL — ABNORMAL HIGH (ref 8–23)
CO2: 22 mmol/L (ref 22–32)
Calcium: 10.3 mg/dL (ref 8.9–10.3)
Chloride: 103 mmol/L (ref 98–111)
Creatinine, Ser: 1.39 mg/dL — ABNORMAL HIGH (ref 0.44–1.00)
GFR, Estimated: 38 mL/min — ABNORMAL LOW (ref >60)
Glucose, Bld: 101 mg/dL — ABNORMAL HIGH (ref 70–99)
Potassium: 4.4 mmol/L (ref 3.5–5.1)
Sodium: 139 mmol/L (ref 135–145)

## 2024-01-12 MED ORDER — METOPROLOL SUCCINATE ER 50 MG PO TB24: 50.0000 mg | ORAL_TABLET | Freq: Every day | ORAL | 3 refills | Status: AC

## 2024-01-12 NOTE — Patient Instructions (Signed)
 Medication Instructions:   DECREASE Toprol  to 50 mg daily  Labwork: BMET today  Testing/Procedures: None today  Follow-Up: 6 months  Any Other Special Instructions Will Be Listed Below (If Applicable).  If you need a refill on your cardiac medications before your next appointment, please call your pharmacy.

## 2024-01-12 NOTE — Progress Notes (Signed)
 Cardiology Office Note  Date: 01/12/2024   ID: [name], DOB 02/26/1941, MRN [id number]  History of Present Illness: [name] is an 83 y.o. female last seen in January.  She is here today with family member for a follow-up visit.  Reports no palpitations or chest pain, states that she feels fatigued in general.  Occasionally feels dizzy, but no syncope.  We went over her medications.  She reports compliance with therapy.  No recent BMET for review, we discussed getting follow-up lab work to ensure her dose of Eliquis  is correct.  She does not report any spontaneous bleeding problems or stool changes.  I reviewed her ECG today which shows sinus bradycardia with left bundle branch block.  We discussed cutting back her Toprol -XL dose to 50 mg daily for now.  Physical Exam: VS:  BP 124/70   Pulse (!) 52   Ht 5' 2.5 (1.588 m)   Wt 179 lb (81.2 kg)   SpO2 97%   BMI 32.22 kg/m , BMI Body mass index is 32.22 kg/m.  Wt Readings from Last 3 Encounters:  01/12/24 179 lb (81.2 kg)  09/28/23 184 lb (83.5 kg)  05/10/23 189 lb 3.2 oz (85.8 kg)    General: Patient appears comfortable at rest. HEENT: Conjunctiva and lids normal. Neck: Supple, no elevated JVP or carotid bruits. Lungs: Clear to auscultation, nonlabored breathing at rest. Cardiac: Regular rate and rhythm, no S3, 2/6 systolic murmur. Extremities: Venous stasis.  ECG:  An ECG dated 10/12/2022 was personally reviewed today and demonstrated:  Atrial fibrillation at 74 bpm with left bundle branch block.  Labwork: July 2024: BUN 27, creatinine 1.58, GFR 33, potassium 3.5 09/28/2023: TSH 2.040   Other Studies Reviewed Today:  Echocardiogram 05/05/2023:  1. Left ventricular ejection fraction, by estimation, is 45 to 50%. The  left ventricle has mildly decreased function. The left ventricle  demonstrates global hypokinesis. Left ventricular diastolic parameters are  consistent with Grade I diastolic  dysfunction  (impaired relaxation).   2. Right ventricular systolic function is normal. The right ventricular  size is normal. There is normal pulmonary artery systolic pressure. The  estimated right ventricular systolic pressure is 23.8 mmHg.   3. Left atrial size was moderately dilated.   4. Right atrial size was moderately dilated.   5. The mitral valve is degenerative. Mild mitral valve regurgitation.   6. The aortic valve is tricuspid. Aortic valve regurgitation is not  visualized. Aortic valve sclerosis is present, with no evidence of aortic  valve stenosis. Aortic valve mean gradient measures 7.0 mmHg.   7. The inferior vena cava is normal in size with greater than 50%  respiratory variability, suggesting right atrial pressure of 3 mmHg.   Assessment and Plan:  1.  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  No weight gain and stable NYHA class II dyspnea.  Does feel fatigued and is bradycardic with evidence of conduction system disease by ECG.  Decrease Toprol -XL to 50 mg daily.  Continue Farxiga  10 mg daily, Entresto  24/26 mg twice daily, Demadex  20 to 40 mg daily and Aldactone  12.5 mg daily.   2.  CAD, moderate by cardiac catheterization in 2002 and with follow-up Myoview  in 2017 demonstrating mild anteroseptal/apical ischemia managed medically.  No active angina.  Continue Pravachol  40 mg daily.  She has as needed nitroglycerin  available.   3.  Paroxysmal atrial fibrillation/flutter.  CHA2DS2-VASc score is 7.  Currently  on Eliquis  5 mg twice daily, check BMET to ensure dose is correct.  She does not report any spontaneous bleeding problems.  No palpitations.   4.  Primary hypertension by history.  Continue with present regimen as discussed above.   5.  Mixed hyperlipidemia.  Continue Pravachol  40 mg daily.  She is following lipids with PCP.   6.  CKD stage IIIb.  Creatinine 1.58 with GFR 33 in July 2024.  Disposition:   Follow up 6 months.  Signed, Jayson JUDITHANN Sierras, M.D., F.A.C.C. [facility]

## 2024-02-06 ENCOUNTER — Other Ambulatory Visit: Payer: Self-pay | Admitting: Cardiology

## 2024-02-06 DIAGNOSIS — I4891 Unspecified atrial fibrillation: Secondary | ICD-10-CM

## 2024-02-07 NOTE — Telephone Encounter (Signed)
 Eliquis  5mg  refill request received. Patient is 83 years old, weight-81.2kg, Crea-1.39 on 01/12/24, Diagnosis-Afib, and last seen by Dr. Debera on 01/12/24. Dose is appropriate based on dosing criteria. Will send in refill to requested pharmacy.

## 2024-03-30 ENCOUNTER — Other Ambulatory Visit: Payer: Self-pay | Admitting: Cardiology

## 2024-05-05 ENCOUNTER — Other Ambulatory Visit: Payer: Self-pay | Admitting: Cardiology

## 2024-05-18 ENCOUNTER — Telehealth: Payer: Self-pay | Admitting: Neurology

## 2024-05-18 NOTE — Telephone Encounter (Signed)
 MYC cxl

## 2024-09-27 ENCOUNTER — Ambulatory Visit: Admitting: Neurology
